# Patient Record
Sex: Female | Born: 1950 | Race: White | Hispanic: No | Marital: Married | Smoking: Never smoker
Health system: Southern US, Community
[De-identification: ages and names within clinical notes are randomized; demographics above are authoritative.]

## PROBLEM LIST (undated history)

## (undated) DIAGNOSIS — T7840XA Allergy, unspecified, initial encounter: Secondary | ICD-10-CM

## (undated) DIAGNOSIS — K219 Gastro-esophageal reflux disease without esophagitis: Secondary | ICD-10-CM

## (undated) DIAGNOSIS — E785 Hyperlipidemia, unspecified: Secondary | ICD-10-CM

## (undated) DIAGNOSIS — N200 Calculus of kidney: Secondary | ICD-10-CM

## (undated) DIAGNOSIS — H269 Unspecified cataract: Secondary | ICD-10-CM

## (undated) DIAGNOSIS — F419 Anxiety disorder, unspecified: Secondary | ICD-10-CM

## (undated) DIAGNOSIS — N390 Urinary tract infection, site not specified: Secondary | ICD-10-CM

## (undated) HISTORY — PX: UPPER GASTROINTESTINAL ENDOSCOPY: SHX188

## (undated) HISTORY — DX: Unspecified cataract: H26.9

## (undated) HISTORY — PX: COLONOSCOPY: SHX174

## (undated) HISTORY — DX: Allergy, unspecified, initial encounter: T78.40XA

## (undated) HISTORY — DX: Anxiety disorder, unspecified: F41.9

## (undated) HISTORY — DX: Urinary tract infection, site not specified: N39.0

## (undated) HISTORY — DX: Hyperlipidemia, unspecified: E78.5

## (undated) HISTORY — DX: Calculus of kidney: N20.0

## (undated) HISTORY — PX: TONSILLECTOMY: SUR1361

## (undated) HISTORY — PX: APPENDECTOMY: SHX54

## (undated) HISTORY — DX: Gastro-esophageal reflux disease without esophagitis: K21.9

---

## 1974-11-16 HISTORY — PX: ABDOMINAL HYSTERECTOMY: SHX81

## 1988-11-16 HISTORY — PX: LAPAROSCOPIC CHOLECYSTECTOMY: SUR755

## 1998-06-07 ENCOUNTER — Encounter: Admission: RE | Admit: 1998-06-07 | Discharge: 1998-09-05 | Payer: Self-pay | Admitting: Neurosurgery

## 1998-07-05 ENCOUNTER — Encounter: Payer: Self-pay | Admitting: Neurosurgery

## 1998-07-05 ENCOUNTER — Ambulatory Visit (HOSPITAL_COMMUNITY): Admission: RE | Admit: 1998-07-05 | Discharge: 1998-07-05 | Payer: Self-pay | Admitting: Neurosurgery

## 1999-02-12 ENCOUNTER — Emergency Department (HOSPITAL_COMMUNITY): Admission: EM | Admit: 1999-02-12 | Discharge: 1999-02-12 | Payer: Self-pay | Admitting: Emergency Medicine

## 1999-02-24 ENCOUNTER — Encounter: Admission: RE | Admit: 1999-02-24 | Discharge: 1999-04-24 | Payer: Self-pay | Admitting: Family Medicine

## 1999-03-28 ENCOUNTER — Encounter: Payer: Self-pay | Admitting: Family Medicine

## 1999-03-28 ENCOUNTER — Ambulatory Visit (HOSPITAL_COMMUNITY): Admission: RE | Admit: 1999-03-28 | Discharge: 1999-03-28 | Payer: Self-pay | Admitting: Family Medicine

## 1999-04-02 ENCOUNTER — Encounter: Payer: Self-pay | Admitting: Family Medicine

## 1999-04-02 ENCOUNTER — Ambulatory Visit (HOSPITAL_COMMUNITY): Admission: RE | Admit: 1999-04-02 | Discharge: 1999-04-02 | Payer: Self-pay | Admitting: Family Medicine

## 2000-02-26 ENCOUNTER — Encounter: Admission: RE | Admit: 2000-02-26 | Discharge: 2000-03-01 | Payer: Self-pay | Admitting: Family Medicine

## 2000-05-14 ENCOUNTER — Other Ambulatory Visit: Admission: RE | Admit: 2000-05-14 | Discharge: 2000-05-14 | Payer: Self-pay | Admitting: Family Medicine

## 2000-05-14 ENCOUNTER — Encounter: Payer: Self-pay | Admitting: Family Medicine

## 2000-05-14 ENCOUNTER — Ambulatory Visit (HOSPITAL_COMMUNITY): Admission: RE | Admit: 2000-05-14 | Discharge: 2000-05-14 | Payer: Self-pay | Admitting: Family Medicine

## 2000-08-02 ENCOUNTER — Encounter: Payer: Self-pay | Admitting: Family Medicine

## 2000-08-02 ENCOUNTER — Ambulatory Visit (HOSPITAL_COMMUNITY): Admission: RE | Admit: 2000-08-02 | Discharge: 2000-08-02 | Payer: Self-pay | Admitting: Family Medicine

## 2000-09-28 ENCOUNTER — Observation Stay (HOSPITAL_COMMUNITY): Admission: RE | Admit: 2000-09-28 | Discharge: 2000-09-29 | Payer: Self-pay | Admitting: Obstetrics and Gynecology

## 2000-09-28 ENCOUNTER — Encounter (INDEPENDENT_AMBULATORY_CARE_PROVIDER_SITE_OTHER): Payer: Self-pay | Admitting: Specialist

## 2001-05-16 ENCOUNTER — Ambulatory Visit (HOSPITAL_COMMUNITY): Admission: RE | Admit: 2001-05-16 | Discharge: 2001-05-16 | Payer: Self-pay | Admitting: Family Medicine

## 2001-05-16 ENCOUNTER — Encounter: Payer: Self-pay | Admitting: Family Medicine

## 2002-05-24 ENCOUNTER — Ambulatory Visit (HOSPITAL_COMMUNITY): Admission: RE | Admit: 2002-05-24 | Discharge: 2002-05-24 | Payer: Self-pay | Admitting: Family Medicine

## 2002-05-24 ENCOUNTER — Encounter: Payer: Self-pay | Admitting: Family Medicine

## 2003-05-28 ENCOUNTER — Ambulatory Visit (HOSPITAL_COMMUNITY): Admission: RE | Admit: 2003-05-28 | Discharge: 2003-05-28 | Payer: Self-pay | Admitting: *Deleted

## 2003-05-28 ENCOUNTER — Encounter: Payer: Self-pay | Admitting: Obstetrics and Gynecology

## 2003-05-30 ENCOUNTER — Encounter: Admission: RE | Admit: 2003-05-30 | Discharge: 2003-05-30 | Payer: Self-pay | Admitting: Obstetrics and Gynecology

## 2003-05-30 ENCOUNTER — Encounter: Payer: Self-pay | Admitting: Obstetrics and Gynecology

## 2004-04-25 ENCOUNTER — Ambulatory Visit (HOSPITAL_COMMUNITY): Admission: RE | Admit: 2004-04-25 | Discharge: 2004-04-25 | Payer: Self-pay | Admitting: Internal Medicine

## 2004-04-28 ENCOUNTER — Ambulatory Visit (HOSPITAL_COMMUNITY): Admission: RE | Admit: 2004-04-28 | Discharge: 2004-04-28 | Payer: Self-pay | Admitting: Internal Medicine

## 2004-05-08 ENCOUNTER — Ambulatory Visit (HOSPITAL_COMMUNITY): Admission: RE | Admit: 2004-05-08 | Discharge: 2004-05-08 | Payer: Self-pay | Admitting: Pulmonary Disease

## 2004-11-11 ENCOUNTER — Ambulatory Visit: Payer: Self-pay | Admitting: Pulmonary Disease

## 2005-05-20 ENCOUNTER — Ambulatory Visit: Payer: Self-pay | Admitting: Pulmonary Disease

## 2006-02-03 ENCOUNTER — Ambulatory Visit (HOSPITAL_COMMUNITY): Admission: RE | Admit: 2006-02-03 | Discharge: 2006-02-03 | Payer: Self-pay | Admitting: Obstetrics & Gynecology

## 2006-02-17 ENCOUNTER — Encounter: Admission: RE | Admit: 2006-02-17 | Discharge: 2006-02-17 | Payer: Self-pay | Admitting: Obstetrics & Gynecology

## 2006-02-18 ENCOUNTER — Ambulatory Visit: Payer: Self-pay | Admitting: Pulmonary Disease

## 2006-09-30 ENCOUNTER — Ambulatory Visit: Payer: Self-pay | Admitting: Internal Medicine

## 2006-09-30 ENCOUNTER — Encounter: Payer: Self-pay | Admitting: Internal Medicine

## 2007-09-12 ENCOUNTER — Ambulatory Visit: Payer: Self-pay | Admitting: Internal Medicine

## 2007-10-04 ENCOUNTER — Ambulatory Visit: Payer: Self-pay | Admitting: Internal Medicine

## 2007-12-27 ENCOUNTER — Encounter: Payer: Self-pay | Admitting: Internal Medicine

## 2007-12-30 ENCOUNTER — Telehealth: Payer: Self-pay | Admitting: Family Medicine

## 2007-12-30 ENCOUNTER — Encounter: Admission: RE | Admit: 2007-12-30 | Discharge: 2007-12-30 | Payer: Self-pay | Admitting: Obstetrics & Gynecology

## 2008-05-29 ENCOUNTER — Ambulatory Visit: Payer: Self-pay | Admitting: Internal Medicine

## 2008-05-29 DIAGNOSIS — K5730 Diverticulosis of large intestine without perforation or abscess without bleeding: Secondary | ICD-10-CM | POA: Insufficient documentation

## 2008-05-29 DIAGNOSIS — K219 Gastro-esophageal reflux disease without esophagitis: Secondary | ICD-10-CM | POA: Insufficient documentation

## 2008-05-29 DIAGNOSIS — K589 Irritable bowel syndrome without diarrhea: Secondary | ICD-10-CM

## 2008-05-29 DIAGNOSIS — G4700 Insomnia, unspecified: Secondary | ICD-10-CM | POA: Insufficient documentation

## 2008-05-29 DIAGNOSIS — F411 Generalized anxiety disorder: Secondary | ICD-10-CM | POA: Insufficient documentation

## 2008-05-29 DIAGNOSIS — E785 Hyperlipidemia, unspecified: Secondary | ICD-10-CM

## 2008-05-29 DIAGNOSIS — F329 Major depressive disorder, single episode, unspecified: Secondary | ICD-10-CM | POA: Insufficient documentation

## 2008-05-29 DIAGNOSIS — J309 Allergic rhinitis, unspecified: Secondary | ICD-10-CM | POA: Insufficient documentation

## 2008-05-29 HISTORY — DX: Diverticulosis of large intestine without perforation or abscess without bleeding: K57.30

## 2008-05-30 ENCOUNTER — Encounter: Payer: Self-pay | Admitting: Internal Medicine

## 2008-05-30 DIAGNOSIS — R319 Hematuria, unspecified: Secondary | ICD-10-CM | POA: Insufficient documentation

## 2008-05-30 LAB — CONVERTED CEMR LAB: Vit D, 1,25-Dihydroxy: 51 (ref 30–89)

## 2008-06-01 LAB — CONVERTED CEMR LAB
Albumin: 4.4 g/dL (ref 3.5–5.2)
Alkaline Phosphatase: 66 units/L (ref 39–117)
Basophils Absolute: 0 10*3/uL (ref 0.0–0.1)
Bilirubin, Direct: 0.1 mg/dL (ref 0.0–0.3)
Calcium: 10.3 mg/dL (ref 8.4–10.5)
Chloride: 101 meq/L (ref 96–112)
Cholesterol: 225 mg/dL (ref 0–200)
Creatinine, Ser: 0.8 mg/dL (ref 0.4–1.2)
Crystals: NEGATIVE
Direct LDL: 152.3 mg/dL
Eosinophils Absolute: 0.1 10*3/uL (ref 0.0–0.7)
HCT: 40.9 % (ref 36.0–46.0)
Hemoglobin: 14 g/dL (ref 12.0–15.0)
Lymphocytes Relative: 33.9 % (ref 12.0–46.0)
Neutro Abs: 4 10*3/uL (ref 1.4–7.7)
Neutrophils Relative %: 56.7 % (ref 43.0–77.0)
Potassium: 4.1 meq/L (ref 3.5–5.1)
RDW: 12.8 % (ref 11.5–14.6)
Sodium: 141 meq/L (ref 135–145)
Squamous Epithelial / LPF: NEGATIVE /lpf
Total Protein: 7.1 g/dL (ref 6.0–8.3)
Triglycerides: 94 mg/dL (ref 0–149)
Urine Glucose: NEGATIVE mg/dL
Urobilinogen, UA: 0.2 (ref 0.0–1.0)
WBC: 6.9 10*3/uL (ref 4.5–10.5)

## 2008-06-05 ENCOUNTER — Telehealth (INDEPENDENT_AMBULATORY_CARE_PROVIDER_SITE_OTHER): Payer: Self-pay | Admitting: *Deleted

## 2008-06-11 ENCOUNTER — Ambulatory Visit: Payer: Self-pay | Admitting: Cardiovascular Disease

## 2008-06-25 ENCOUNTER — Telehealth (INDEPENDENT_AMBULATORY_CARE_PROVIDER_SITE_OTHER): Payer: Self-pay | Admitting: *Deleted

## 2008-06-26 ENCOUNTER — Telehealth (INDEPENDENT_AMBULATORY_CARE_PROVIDER_SITE_OTHER): Payer: Self-pay | Admitting: *Deleted

## 2009-02-20 ENCOUNTER — Encounter: Payer: Self-pay | Admitting: Internal Medicine

## 2009-02-22 ENCOUNTER — Encounter: Payer: Self-pay | Admitting: Internal Medicine

## 2009-03-14 ENCOUNTER — Encounter: Payer: Self-pay | Admitting: Internal Medicine

## 2009-04-01 ENCOUNTER — Ambulatory Visit: Payer: Self-pay | Admitting: Internal Medicine

## 2009-04-01 DIAGNOSIS — R209 Unspecified disturbances of skin sensation: Secondary | ICD-10-CM

## 2009-04-01 HISTORY — DX: Unspecified disturbances of skin sensation: R20.9

## 2009-04-03 ENCOUNTER — Telehealth (INDEPENDENT_AMBULATORY_CARE_PROVIDER_SITE_OTHER): Payer: Self-pay | Admitting: *Deleted

## 2009-04-09 ENCOUNTER — Encounter: Admission: RE | Admit: 2009-04-09 | Discharge: 2009-04-09 | Payer: Self-pay | Admitting: Internal Medicine

## 2009-04-11 ENCOUNTER — Telehealth: Payer: Self-pay | Admitting: Internal Medicine

## 2010-12-07 ENCOUNTER — Encounter: Payer: Self-pay | Admitting: Obstetrics & Gynecology

## 2010-12-08 ENCOUNTER — Encounter: Payer: Self-pay | Admitting: Internal Medicine

## 2011-04-03 NOTE — Assessment & Plan Note (Signed)
[facility]                           GASTROENTEROLOGY OFFICE NOTE   JASHIRA, COTUGNO                      MRN:          [id number]  DATE:09/29/2006                            DOB:          01/10/1951    Ms. Lichtenberg is a 60 year old white female with history of irritable bowel  syndrome, elevated salivary amylase, positive family history of colon cancer  in her brother.  We have followed her for gastroesophageal reflux as well as  for abdominal pain.  Last colonoscopy November 2003, upper endoscopy June  2005 for evaluation of dysphagia.  No stricture was seen at that time, but  she had bile gastritis.  She has been intermittently on Librax and PPIs.  Several months ago, she woke up with some hematemesis.  This episode  occurred again subsequently.  Because of difficulty in swallowing and  fullness in her throat, she saw an ear, nose, throat specialist in [place] where she lives.  She was examined and put on Nexium after diagnosis  of LPR.  She has stayed now on Nexium 40 mg a day for the past 2 months with  marked improvement of her symptoms but not a complete resolution.  She has  about 3 bowel movements a day.  She eats a lot of fiber but has been having  a lot of stress because of some marital problems.  She wants to move back to  [place], but her husband wants to stay in [place].   MEDICATIONS:  1. Prozac 10 mg q.d.  2. Estradiol.   PHYSICAL EXAMINATION:  VITAL SIGNS:  Blood pressure 172/82, pulse 64, and  weight 131 pounds.  She was alert, oriented, in no distress.  LUNGS:  Clear to auscultation.  COR:  Normal S1, normal S1.  ABDOMEN:  Soft with minimal tenderness in epigastric area and above the  umbilicus in the midline.  Low abdomen was normal.  Bowel sounds are  normoactive.  There was no tympany and no distention.  RECTAL:  Showed heme-occult negative stool.   IMPRESSION:  1. A 60 year old white female with  new onset hematemesis and LPR diagnosed      by ENT specialist, who had gastroesophageal reflux disease, Barrett's      esophagus.  2. History of irritable bowel syndrome.  3. Positive family history of colon cancer in her brother, next      colonoscopy due in November 2008.   PLAN:  1. Upper endoscopy will be scheduled as soon as possible to assess the      hematemesis.  2. Continue Nexium 40 mg daily.  Add Pepcid 40 mg at bedtime.  3. Probiotics, samples of Align to take one daily.  As an alternative, she      can take Levsin      sublingually 0.125 mg for bloating and flatulence.  Consider CT scan of      the chest for abnormality on chest CT several years ago, evaluated by      Dr. Kriste Basque.     Hedwig Morton. Juanda Chance, MD  Electronically Signed  DMB/MedQ  DD: 09/29/2006  DT: 09/29/2006  Job #: [id number]   cc:   Lonzo Cloud. Kriste Basque, MD

## 2011-04-03 NOTE — Op Note (Signed)
[facility]  Patient:    Kayla Gallegos, Kayla Gallegos                      MRN: [id number] Proc. Date: 09/28/00 Adm. Date:  [id number] Attending:  Jenean Lindau CC:         Roxy Manns, M.D. [facility]   Operative Report  PREOPERATIVE DIAGNOSIS:       Left adnexal complex mass.  POSTOPERATIVE DIAGNOSES:      Left adnexal complex mass, pelvic adhesions.  PROCEDURE:                    Laparoscopic left salpingo-oophorectomy with lysis of adhesions.  SURGEON:                      Laqueta Linden, M.D.  ASSISTANT:                    Edwena Felty. Ashley Royalty, M.D.  ANESTHESIA:                   General endotracheal.  ESTIMATED BLOOD LOSS:         Less than 50 cc.  SPECIMEN:                     Left tube and ovary.  COUNTS:                       Correct.  COMPLICATIONS:                None.  INDICATIONS:                  Kayla Gallegos is a 60 year old para 2 female who underwent a TAH, RSO in the distant past for pelvic inflammatory disease and menorrhagia.  [place]  She returned recently and was undergoing some evaluation for back pain and had a pelvic examination by her primary physician which revealed some tenderness on pelvic examination.  She had a pelvic ultrasound which revealed a complex cyst involving the left ovary.  She had a follow-up ultrasound in six weeks which revealed an abnormal left ovary with a 1.8 cm complex cyst with a thick septation and echoes and an additional 2.5 cm complex cyst with low level echoes.  She reported pain with certain types of movement, but no acute symptoms.  Compounding history is that the patient lives out of the country and plans to return to the [place].  She had a CA 125 which was within normal limits at 14.  Due to her concern over malignancy and pain as well as her living situation, she elected to proceed to removal of the persistent adnexal mass.  She was counseled as to  risks, benefits, alternatives, and complications including the very low risk of malignancy and the significant risk of encountering adhesions and possibly needing to do a laparotomy as well as increased risk of injury to bowel, bladder, ureters, vessels, and nerves, again related to her past history of adhesions.  She has voiced her understanding, accepts all risks, and agrees to proceed.  PROCEDURE:                    Patient was taken to the operating room and after proper identification and consents were ascertained she was placed on the operating table in supine position.  After the induction  of general endotracheal anesthesia she was placed in the stirrups and the abdomen, perineum, and vagina were prepped and draped in a routine sterile fashion.  A transurethral Foley was placed which was removed at the conclusion of the procedure.  A sponge stick was placed in the vagina.  A 2 cm infraumbilical incision was then made and the Veress needle inserted into what was felt to be the peritoneal cavity with appropriate findings at hanging drop and saline installation test.  3 L of CO2 were infused.  The Veress needle was then removed.  The Trocar was then inserted.  Upon insertion of the laparoscope it appeared that the Trocar was in the peritoneal cavity with no obvious injury or bleeding at the insertion site or surrounding areas.  However, it appeared that the majority of the insufflation had gone into the preperitoneal space with some bulging and edema of the space.  There was no active bleeding or any hematoma formation noted and this was observed throughout the procedure.  The pneumoperitoneum was then established without difficulty.  A 5 mm port was placed in the right lower quadrant and a 10 mm port in the left lower quadrant under direct vision.  Inspection of the upper abdomen revealed a smooth liver edge.  The appendix was not visualized and was surgically absent by  history. Patient was placed in steep Trendelenburg.  The vaginal cuff was identified by the sponge stick.  The left ovary was noted to be encased in adhesions including small bowel, appendices epiploica, and multiple perineal adhesions involving both the ovary and the tube.  The ovary itself appeared to have a nodular component on the posterior aspect but this appeared to be more consistent with a resolving hemorrhagic cyst.  There was no evidence of any gross malignancy.  It was felt that the complex nature of what was seen on the ultrasound was probably related to the multiple adhesions and windows in peritoneum and appendices epiploica that created the appearance of a cystic mass.  In any event, it was felt that this could be removed laparoscopically if the ovary could be appropriately mobilized.  An extensive amount of time was spent doing careful sharp dissection using the [place] for hydro dissection as well as the monopolar cautery scissors.  Care was taken to stay as far from the bowel attachment as possible.  It was felt that this was accomplished without any question of injury to bowel.  The ovary was mobilized from below by blunt dissection in order to pull it out of the course of the ureter.  This dissection process took approximately 50% greater time than typical for a laparoscopic procedure of this sort.  Eventually, the adnexa was mobilized and at this point 0 Vicryl endo loops were utilized to ligate the infundibulopelvic ligament.  The tube had to be removed separately as the pedicle was too large and it was not felt the entire ovary would be removed in this fashion.  The endo loops were then used on the mesosalpinx and the tube was then excised and removed separately.  The ovary was further mobilized and two 0 Vicryl endo loops were then placed across the infundibulopelvic ligament.  The specimen was then excised and removed through the large Trocar. Hemostasis was noted  to be excellent.  There appeared to be no residual ovarian tissue on the pedicle.  Several small bleeding points were cauterized.  Care was taken to stay away from the ureter as well as the bowel.  Copious lavage was accomplished and observation beneath the fluid with and without release of the pressure was accomplished with no bleeding noted.  At this point the subcutaneous air had almost completely resolved.  The remainder of the pneumoperitoneum was allowed to escape and the accessory ports were removed under direct vision.  The 10 mm port had closure of the fascia under direct vision with a figure-of-eight suture of 0 Vicryl.  Both accessory incisions were closed with the skin glue.  The umbilical incision was closed with a subcuticular stitch of 4-0 Dexon.  Steri-Strips and pressure dressing were applied to this incision.  The incisions were injected with total of 10 cc of 1/4% plain Marcaine for local analgesia.  The sponge stick and Foley catheter were removed.  The patient was stable on transfer to the recovery room.  She will be observed and discharged per anesthesia.  She will be given routine instructions including instructions to call for nausea, vomiting, excessive pain, fever, or other concerns.  She will be given prescriptions for Percocet, dispensed 20, one to two q.4-6h. p.r.n. pain.  Told to take Advil or Aleve as needed.  Given a prescription for estradiol 1 mg one p.o. q.d., dispensed 31 with 11 refills.  She is to follow up in the office in two weeks or sooner for excessive pain, fever, bleeding, or other concerns. DD:  09/28/00 TD:  09/28/00 Job: [id number]

## 2012-08-22 ENCOUNTER — Encounter: Payer: Self-pay | Admitting: Internal Medicine

## 2012-09-30 ENCOUNTER — Encounter: Payer: Self-pay | Admitting: Internal Medicine

## 2012-11-17 ENCOUNTER — Ambulatory Visit (AMBULATORY_SURGERY_CENTER): Payer: PRIVATE HEALTH INSURANCE | Admitting: *Deleted

## 2012-11-17 VITALS — Ht 62.5 in | Wt 138.0 lb

## 2012-11-17 DIAGNOSIS — Z1211 Encounter for screening for malignant neoplasm of colon: Secondary | ICD-10-CM

## 2012-11-17 DIAGNOSIS — Z800 Family history of malignant neoplasm of digestive organs: Secondary | ICD-10-CM

## 2012-11-17 MED ORDER — MOVIPREP 100 G PO SOLR: ORAL | Status: DC

## 2012-11-29 ENCOUNTER — Encounter: Payer: Self-pay | Admitting: Internal Medicine

## 2012-11-29 ENCOUNTER — Ambulatory Visit (AMBULATORY_SURGERY_CENTER): Payer: PRIVATE HEALTH INSURANCE | Admitting: Internal Medicine

## 2012-11-29 VITALS — BP 146/94 | HR 78 | Temp 97.0°F | Resp 14 | Ht 62.5 in | Wt 138.0 lb

## 2012-11-29 DIAGNOSIS — D126 Benign neoplasm of colon, unspecified: Secondary | ICD-10-CM

## 2012-11-29 DIAGNOSIS — Z800 Family history of malignant neoplasm of digestive organs: Secondary | ICD-10-CM

## 2012-11-29 DIAGNOSIS — Z1211 Encounter for screening for malignant neoplasm of colon: Secondary | ICD-10-CM

## 2012-11-29 MED ORDER — SODIUM CHLORIDE 0.9 % IV SOLN: 500.0000 mL | INTRAVENOUS | Status: DC

## 2012-11-29 NOTE — Progress Notes (Signed)
Patient did not experience any of the following events: a burn prior to discharge; a fall within the facility; wrong site/side/patient/procedure/implant event; or a hospital transfer or hospital admission upon discharge from the facility. (G8907) Patient did not have preoperative order for IV antibiotic SSI prophylaxis. (G8918)  

## 2012-11-29 NOTE — Progress Notes (Addendum)
No egg or soy allergy. ewm 

## 2012-11-29 NOTE — Op Note (Signed)
[facility] [address] [place], [address]   COLONOSCOPY PROCEDURE REPORT  PATIENT: Kayla Gallegos, Kayla J.  MR#: [id number] BIRTHDATE: Jun 10, 1951 , 61  yrs. old GENDER: Female ENDOSCOPIST: Hart Carwin, MD REFERRED BY:  recall colonoscopy PROCEDURE DATE:  11/29/2012 PROCEDURE:   Colonoscopy with cold biopsy polypectomy ASA CLASS:   Class II INDICATIONS:Patient's immediate family history of colon cancer and prior colonoscopies 1994,1998,2003,2008, having RLQ abd.  pain. MEDICATIONS: MAC sedation, administered by CRNA and Propofol (Diprivan) 220 mg IV  DESCRIPTION OF PROCEDURE:   After the risks and benefits and of the procedure were explained, informed consent was obtained.  A digital rectal exam revealed no abnormalities of the rectum.    The LB [id number]-[id number]  endoscope was introduced through the anus and advanced to the cecum, which was identified by both the appendix and ileocecal valve .  The quality of the prep was excellent, using MoviPrep .  The instrument was then slowly withdrawn as the colon was fully examined.     COLON FINDINGS: A smooth sessile polyp ranging between 3-27mm in size was found at the cecum.  A polypectomy was performed with cold forceps.  The resection was complete and the polyp tissue was completely retrieved.   There was severe diverticulosis noted in the sigmoid colon with associated tortuosity, muscular hypertrophy and colonic narrowing.     Retroflexed views revealed no abnormalities.     The scope was then withdrawn from the patient and the procedure completed.  COMPLICATIONS: There were no complications. ENDOSCOPIC IMPRESSION: 1.   Sessile polyp ranging between 3-92mm in size was found at the cecum; polypectomy was performed with cold forceps 2.   There was severe diverticulosis noted in the sigmoid colon  RECOMMENDATIONS: High fiber diet Metamucil 1 tsp daily await path report   REPEAT EXAM: In 5 year(s)  for  Colonoscopy.  cc:    Dr Oliver Barre  _______________________________ eSigned:  Hart Carwin, MD 11/29/2012 9:29 AM     PATIENT NAME:  Kayla Gallegos, Kayla Grad. MR#: [id number]

## 2012-11-29 NOTE — Patient Instructions (Addendum)

## 2012-11-29 NOTE — Progress Notes (Signed)
Pt stable to RR 

## 2012-11-30 ENCOUNTER — Telehealth: Payer: Self-pay

## 2012-11-30 NOTE — Telephone Encounter (Signed)
  Follow up Call-  Call back number 11/29/2012  Post procedure Call Back phone  # [phone]  Permission to leave phone message Yes     Patient questions:  Do you have a fever, pain , or abdominal swelling? yes Pain Score  3 *  Have you tolerated food without any problems? yes  Have you been able to return to your normal activities? yes  Do you have any questions about your discharge instructions: Diet   no Medications  no Follow up visit  no  Do you have questions or concerns about your Care? no  Actions: * If pain score is 4 or above: No action needed, pain <4.

## 2012-12-02 ENCOUNTER — Telehealth: Payer: Self-pay | Admitting: Internal Medicine

## 2012-12-02 DIAGNOSIS — R109 Unspecified abdominal pain: Secondary | ICD-10-CM

## 2012-12-02 MED ORDER — DICYCLOMINE HCL 20 MG PO TABS: ORAL_TABLET | ORAL | Status: DC

## 2012-12-02 NOTE — Telephone Encounter (Signed)
Bentyl 20 mg po bid, # 30,, if no improvement call back in 2 weeks and we will decide if we need to order CT scan

## 2012-12-02 NOTE — Telephone Encounter (Signed)
Spoke with patient and gave her Dr. Regino Schultze recommendations. Rx sent. She will call me in 2 weeks with update.

## 2012-12-02 NOTE — Telephone Encounter (Signed)
Patient calling to report right side pain that started on Wednesday after her procedure. The pain is below the belly button "in my ovary area." It is there all the time but gets worse during the evening. States it is sore to touch. She took Ibuprofen last night for it. She reports feeling cold last night but does not know if she had a fever. Denies nausea or vomiting. She is having normal bowel movements. She reports she had right side pain off and on before the colonoscopy but never this severe. Colonoscopy on 11/29/12 with polypectomy, severe diverticulosis in sigmoid. Please, advise.

## 2012-12-05 ENCOUNTER — Encounter: Payer: Self-pay | Admitting: Internal Medicine

## 2014-11-13 ENCOUNTER — Telehealth: Payer: Self-pay | Admitting: Internal Medicine

## 2014-11-13 NOTE — Telephone Encounter (Signed)
Pt was a pt of Dr Glori Bickers and was last seen by [place] in 2003. She moved to [place] and has just moved back here and would like to re-est care with Dr Glori Bickers. I know she is not taking new patients but is it ok to re-est pts with Dr Glori Bickers?

## 2014-11-13 NOTE — Telephone Encounter (Signed)
That is ok -please put her in for 30 min visit to est

## 2014-11-14 NOTE — Telephone Encounter (Signed)
Called pt to notify that she can re-est with Dr Glori Bickers but pt will need to call back to schedule this. She has some trips planned and did not have her calendar available. Will call back after the first of the year to schedule.

## 2015-07-22 DIAGNOSIS — R030 Elevated blood-pressure reading, without diagnosis of hypertension: Secondary | ICD-10-CM | POA: Diagnosis not present

## 2015-07-22 DIAGNOSIS — S61241A Puncture wound with foreign body of left index finger without damage to nail, initial encounter: Secondary | ICD-10-CM | POA: Diagnosis not present

## 2016-08-17 DIAGNOSIS — N200 Calculus of kidney: Secondary | ICD-10-CM | POA: Diagnosis not present

## 2016-08-17 DIAGNOSIS — R3915 Urgency of urination: Secondary | ICD-10-CM | POA: Diagnosis not present

## 2016-08-17 DIAGNOSIS — R3121 Asymptomatic microscopic hematuria: Secondary | ICD-10-CM | POA: Diagnosis not present

## 2016-08-17 DIAGNOSIS — N3020 Other chronic cystitis without hematuria: Secondary | ICD-10-CM | POA: Diagnosis not present

## 2016-08-24 DIAGNOSIS — R3121 Asymptomatic microscopic hematuria: Secondary | ICD-10-CM | POA: Diagnosis not present

## 2016-08-24 DIAGNOSIS — R3129 Other microscopic hematuria: Secondary | ICD-10-CM | POA: Diagnosis not present

## 2016-09-21 ENCOUNTER — Telehealth: Payer: Self-pay | Admitting: Internal Medicine

## 2016-09-21 NOTE — Telephone Encounter (Signed)
Patient called this morning and due to living in [place], would like to transfer back to [facility] from [place]. She has been living in the [place] for some time and has no significant health concerns, just wants to begin getting regular CPE's. Please let me know if this transfer would be OK with both providers. Best number to call pt is [phone].

## 2016-09-22 NOTE — Telephone Encounter (Signed)
Left message to schedule/  lt

## 2016-09-25 DIAGNOSIS — R8271 Bacteriuria: Secondary | ICD-10-CM | POA: Diagnosis not present

## 2016-09-25 DIAGNOSIS — R350 Frequency of micturition: Secondary | ICD-10-CM | POA: Diagnosis not present

## 2016-09-30 ENCOUNTER — Encounter: Payer: Self-pay | Admitting: Primary Care

## 2016-09-30 ENCOUNTER — Ambulatory Visit (INDEPENDENT_AMBULATORY_CARE_PROVIDER_SITE_OTHER): Payer: Medicare Other | Admitting: Primary Care

## 2016-09-30 VITALS — BP 136/80 | HR 65 | Temp 98.1°F | Ht 62.0 in | Wt 133.8 lb

## 2016-09-30 DIAGNOSIS — E785 Hyperlipidemia, unspecified: Secondary | ICD-10-CM

## 2016-09-30 DIAGNOSIS — R0989 Other specified symptoms and signs involving the circulatory and respiratory systems: Secondary | ICD-10-CM | POA: Diagnosis not present

## 2016-09-30 DIAGNOSIS — E2839 Other primary ovarian failure: Secondary | ICD-10-CM

## 2016-09-30 DIAGNOSIS — Z0001 Encounter for general adult medical examination with abnormal findings: Secondary | ICD-10-CM

## 2016-09-30 DIAGNOSIS — Z1159 Encounter for screening for other viral diseases: Secondary | ICD-10-CM

## 2016-09-30 DIAGNOSIS — N390 Urinary tract infection, site not specified: Secondary | ICD-10-CM

## 2016-09-30 LAB — COMPREHENSIVE METABOLIC PANEL
ALT: 24 U/L (ref 0–35)
AST: 24 U/L (ref 0–37)
Albumin: 4.7 g/dL (ref 3.5–5.2)
Alkaline Phosphatase: 70 U/L (ref 39–117)
BILIRUBIN TOTAL: 0.6 mg/dL (ref 0.2–1.2)
BUN: 11 mg/dL (ref 6–23)
CALCIUM: 10.1 mg/dL (ref 8.4–10.5)
CHLORIDE: 100 meq/L (ref 96–112)
CO2: 31 meq/L (ref 19–32)
CREATININE: 0.7 mg/dL (ref 0.40–1.20)
GFR: 88.92 mL/min (ref >60.00)
Glucose, Bld: 96 mg/dL (ref 70–99)
Potassium: 4.5 mEq/L (ref 3.5–5.1)
SODIUM: 138 meq/L (ref 135–145)
Total Protein: 7.3 g/dL (ref 6.0–8.3)

## 2016-09-30 LAB — LIPID PANEL
CHOL/HDL RATIO: 4
Cholesterol: 202 mg/dL — ABNORMAL HIGH (ref 0–200)
HDL: 54.6 mg/dL (ref >39.00)
LDL CALC: 130 mg/dL — AB (ref 0–99)
NONHDL: 147.09
TRIGLYCERIDES: 84 mg/dL (ref 0.0–149.0)
VLDL: 16.8 mg/dL (ref 0.0–40.0)

## 2016-09-30 NOTE — Assessment & Plan Note (Signed)
No current treatment, lipids pending. Carotid bruit noted upon exam. Given history of hyperlipidemia and undiagnosed bruit, will send for carotid dopplers.

## 2016-09-30 NOTE — Assessment & Plan Note (Signed)
Td UTD, declines influenza, pneumonia, and zostavax vaccinations. Declines mammogram. Colonoscopy UTD. Commended her on her healthy diet. Encouraged regular exercise and stress reduction.  Exam with right carotid bruit, otherwise unremarkable. Labs pending. Follow up in 1 year for annual physical.

## 2016-09-30 NOTE — Assessment & Plan Note (Signed)
Follows with Urology. 4 UTI's in 2017 thus far.

## 2016-09-30 NOTE — Progress Notes (Signed)
Pre visit review using our clinic review tool, if applicable. No additional management support is needed unless otherwise documented below in the visit note. 

## 2016-09-30 NOTE — Progress Notes (Signed)
Subjective:    Patient ID: Kayla Gallegos, female    DOB: 03-30-1951, 65 y.o.   MRN: [id number]  HPI  Kayla Gallegos is a 65 year old female who presents today to re-establish care, discuss the problems mentioned below, and for complete physical. Will obtain records. Her last physical was years ago.   1) Recurrent UTI's: Over the last several years she's experienced frequent UTI's. She's had 4 UTI's in 2017. She currently follows with Urology, last visit being last Friday. She was diagnosed with a UTI, has been taking AZO since with improvement. She's not yet started her antibiotics. When she experiences UTI's she will experience urinary frequency and urgency. She denies these symptoms at this point. She also denies fevers, flank pain, hematuria, abdominal pain.  2) Hyperlipidemia: Long history of hyperlipidemia, also strong family history. She controls her cholesterol with healthy lifestyle. She's taking Niacin OTC.    Immunizations: -Tetanus: Completed 3 years ago. -Influenza: Declines today. -Pneumonia: Never completed, declines.  -Shingles: Never completed, declines.  Diet: She endorses a healthy diet. Breakfast: Oatmeal, fruit, nuts, seed Lunch: Salad, fruit Dinner: Fish, vegetables, salad, fruit Snacks: Fruit, nuts, seeds Desserts: None Beverages: Coffee, hot tea, water, juice  Exercise: She does not currently exercise Eye exam: Completed in March 2017. Dental exam: Completes semi-annually. Colonoscopy: Completed in 2013, due in 2018. Dexa: Completed in 2007. Due.  Pap Smear: Hysterectomy  Mammogram: Completed last in 2009, declines today.   Review of Systems  Constitutional: Negative for unexpected weight change.  HENT: Negative for rhinorrhea.   Respiratory: Negative for cough and shortness of breath.   Cardiovascular: Negative for chest pain.  Gastrointestinal: Negative for constipation and diarrhea.  Genitourinary: Negative for difficulty urinating and menstrual  problem.  Musculoskeletal: Negative for arthralgias and myalgias.  Skin: Negative for rash.  Allergic/Immunologic: Negative for environmental allergies.  Neurological: Negative for dizziness, numbness and headaches.  Psychiatric/Behavioral:       Has experienced personal and work related stress.        Past Medical History:  Diagnosis Date  . Hyperlipidemia   . Kidney stones   . Recurrent UTI (urinary tract infection)      Social History   Social History  . Marital status: Married    Spouse name: N/A  . Number of children: N/A  . Years of education: N/A   Occupational History  . Not on file.   Social History Main Topics  . Smoking status: Never Smoker  . Smokeless tobacco: Never Used  . Alcohol use No  . Drug use: No  . Sexual activity: Not on file   Other Topics Concern  . Not on file   Social History Narrative  . No narrative on file    Past Surgical History:  Procedure Laterality Date  . ABDOMINAL HYSTERECTOMY  1976  . LAPAROSCOPIC CHOLECYSTECTOMY  1990  . TONSILLECTOMY      Family History  Problem Relation Age of Onset  . Colon cancer Sister 77  . Colon cancer Brother 109  . Stomach cancer Neg Hx     Allergies  Allergen Reactions  . Codeine Nausea And Vomiting and Nausea Only    dizziness    Current Outpatient Prescriptions on File Prior to Visit  Medication Sig Dispense Refill  . B Complex Vitamins (VITAMIN-B COMPLEX PO) Take by mouth daily.    Marland Kitchen FOLIC ACID PO Take by mouth daily.    Marland Kitchen MAGNESIUM CARBONATE PO Take 1 capsule by mouth daily.    Marland Kitchen  niacin 250 MG tablet Take 250 mg by mouth daily with breakfast. Takes total of 250 mg twice daily    . Probiotic Product (PROBIOTIC DAILY PO) Take by mouth daily.     No current facility-administered medications on file prior to visit.     BP 136/80   Pulse 65   Temp 98.1 F (36.7 C) (Oral)   Ht 5\' 2"  (1.575 m)   Wt 133 lb 12.8 oz (60.7 kg)   SpO2 98%   BMI 24.47 kg/m    Objective:    Physical Exam  Constitutional: She is oriented to person, place, and time. She appears well-nourished.  HENT:  Right Ear: Tympanic membrane and ear canal normal.  Left Ear: Tympanic membrane and ear canal normal.  Nose: Nose normal.  Mouth/Throat: Oropharynx is clear and moist.  Eyes: Conjunctivae and EOM are normal. Pupils are equal, round, and reactive to light.  Neck: Neck supple. Carotid bruit is present. No thyromegaly present.  Right carotid bruit  Cardiovascular: Normal rate and regular rhythm.   No murmur heard. Pulmonary/Chest: Effort normal and breath sounds normal. She has no rales.  Abdominal: Soft. Bowel sounds are normal. There is no tenderness.  Musculoskeletal: Normal range of motion.  Lymphadenopathy:    She has no cervical adenopathy.  Neurological: She is alert and oriented to person, place, and time. She has normal reflexes. No cranial nerve deficit.  Skin: Skin is warm and dry. No rash noted.  Psychiatric: She has a normal mood and affect.          Assessment & Plan:

## 2016-09-30 NOTE — Patient Instructions (Addendum)
Complete lab work prior to leaving today.  You will be contacted regarding your bone density testing.  Please let us know if you have not heard back within one week.   Stop by the front desk and speak with either Rosaria Ferries or Ebony Hail regarding your carotid ultrasound test.   Start exercising. You should be getting 150 minutes of moderate intensity exercise weekly.  Continue your efforts towards a healthy diet.  I recommend pneumonia and shingles vaccinations. I also recommend a mammogram every 2 years.   I will be in touch with you once I receive your lab results.  It was a pleasure to meet you today! Please don't hesitate to call me with any questions. Welcome to [facility]!

## 2016-10-01 LAB — HEPATITIS C ANTIBODY: HCV Ab: NEGATIVE

## 2016-10-02 ENCOUNTER — Ambulatory Visit (HOSPITAL_COMMUNITY)
Admission: RE | Admit: 2016-10-02 | Discharge: 2016-10-02 | Disposition: A | Discharge status: Home / Self Care | Payer: Medicare Other | Source: Ambulatory Visit | Attending: Cardiovascular Disease | Admitting: Cardiovascular Disease

## 2016-10-02 ENCOUNTER — Telehealth: Payer: Self-pay | Admitting: Primary Care

## 2016-10-02 DIAGNOSIS — R0989 Other specified symptoms and signs involving the circulatory and respiratory systems: Secondary | ICD-10-CM | POA: Insufficient documentation

## 2016-10-02 DIAGNOSIS — E785 Hyperlipidemia, unspecified: Secondary | ICD-10-CM | POA: Diagnosis not present

## 2016-10-02 DIAGNOSIS — I6523 Occlusion and stenosis of bilateral carotid arteries: Secondary | ICD-10-CM | POA: Insufficient documentation

## 2016-10-02 NOTE — Telephone Encounter (Signed)
Patient returned Chan's call. °

## 2016-10-05 DIAGNOSIS — N200 Calculus of kidney: Secondary | ICD-10-CM | POA: Diagnosis not present

## 2016-10-05 DIAGNOSIS — R3121 Asymptomatic microscopic hematuria: Secondary | ICD-10-CM | POA: Diagnosis not present

## 2016-10-05 NOTE — Telephone Encounter (Signed)
Spoken and notified patient of Kate's comments. Patient verbalized understanding. 

## 2016-10-20 ENCOUNTER — Other Ambulatory Visit: Payer: Medicare Other

## 2016-12-07 DIAGNOSIS — J069 Acute upper respiratory infection, unspecified: Secondary | ICD-10-CM | POA: Diagnosis not present

## 2016-12-08 ENCOUNTER — Ambulatory Visit: Payer: Medicare Other | Admitting: Internal Medicine

## 2017-02-01 ENCOUNTER — Ambulatory Visit: Payer: Medicare Other | Admitting: Family Medicine

## 2017-04-29 DIAGNOSIS — M545 Low back pain: Secondary | ICD-10-CM | POA: Diagnosis not present

## 2017-04-29 DIAGNOSIS — M5432 Sciatica, left side: Secondary | ICD-10-CM | POA: Diagnosis not present

## 2017-05-06 DIAGNOSIS — M545 Low back pain: Secondary | ICD-10-CM | POA: Diagnosis not present

## 2017-05-12 DIAGNOSIS — M48062 Spinal stenosis, lumbar region with neurogenic claudication: Secondary | ICD-10-CM | POA: Diagnosis not present

## 2017-05-12 DIAGNOSIS — M545 Low back pain: Secondary | ICD-10-CM | POA: Diagnosis not present

## 2017-05-18 DIAGNOSIS — M48062 Spinal stenosis, lumbar region with neurogenic claudication: Secondary | ICD-10-CM | POA: Diagnosis not present

## 2017-05-25 DIAGNOSIS — M48062 Spinal stenosis, lumbar region with neurogenic claudication: Secondary | ICD-10-CM | POA: Diagnosis not present

## 2017-05-27 DIAGNOSIS — M48062 Spinal stenosis, lumbar region with neurogenic claudication: Secondary | ICD-10-CM | POA: Diagnosis not present

## 2017-06-01 DIAGNOSIS — M48062 Spinal stenosis, lumbar region with neurogenic claudication: Secondary | ICD-10-CM | POA: Diagnosis not present

## 2017-06-03 DIAGNOSIS — M48062 Spinal stenosis, lumbar region with neurogenic claudication: Secondary | ICD-10-CM | POA: Diagnosis not present

## 2017-06-08 DIAGNOSIS — M48062 Spinal stenosis, lumbar region with neurogenic claudication: Secondary | ICD-10-CM | POA: Diagnosis not present

## 2017-06-09 DIAGNOSIS — M48062 Spinal stenosis, lumbar region with neurogenic claudication: Secondary | ICD-10-CM | POA: Diagnosis not present

## 2017-10-18 ENCOUNTER — Encounter: Payer: Self-pay | Admitting: Primary Care

## 2017-10-18 ENCOUNTER — Ambulatory Visit (INDEPENDENT_AMBULATORY_CARE_PROVIDER_SITE_OTHER): Payer: Medicare Other | Admitting: Primary Care

## 2017-10-18 VITALS — BP 134/82 | HR 87 | Temp 98.7°F | Wt 137.0 lb

## 2017-10-18 DIAGNOSIS — N390 Urinary tract infection, site not specified: Secondary | ICD-10-CM | POA: Diagnosis not present

## 2017-10-18 DIAGNOSIS — R3915 Urgency of urination: Secondary | ICD-10-CM | POA: Diagnosis not present

## 2017-10-18 DIAGNOSIS — Z87440 Personal history of urinary (tract) infections: Secondary | ICD-10-CM | POA: Diagnosis not present

## 2017-10-18 DIAGNOSIS — N898 Other specified noninflammatory disorders of vagina: Secondary | ICD-10-CM

## 2017-10-18 LAB — POC URINALSYSI DIPSTICK (AUTOMATED)
BILIRUBIN UA: NEGATIVE
Blood, UA: NEGATIVE
GLUCOSE UA: NEGATIVE
Ketones, UA: NEGATIVE
LEUKOCYTES UA: NEGATIVE
NITRITE UA: NEGATIVE
Protein, UA: NEGATIVE
Spec Grav, UA: 1.01 (ref 1.010–1.025)
UROBILINOGEN UA: 0.2 U/dL
pH, UA: 6 (ref 5.0–8.0)

## 2017-10-18 MED ORDER — ESTRADIOL 0.1 MG/GM VA CREA: 1.0000 | TOPICAL_CREAM | VAGINAL | 1 refills | Status: DC

## 2017-10-18 NOTE — Addendum Note (Signed)
Addended by: Modena Nunnery on: 10/18/2017 04:00 PM   Modules accepted: Orders

## 2017-10-18 NOTE — Patient Instructions (Signed)
Try the estrace cream for vaginal dryness. Insert one applicator three times weekly for vaginal dryness and UTI prevention.  Continue to push intake of water.  Please contact your Urologist if your symptoms persist.  It was a pleasure to see you today!

## 2017-10-18 NOTE — Assessment & Plan Note (Signed)
UA today unremarkable, will send culture given history. Exam today not representative of renal stones, she does have a strainer at home. She is in no distress. Follows with Urology, will have her follow up with them if symptoms persist and culture negative. Could be interstitial cystitis. Will have her continue to push water intake, continue AZO.

## 2017-10-18 NOTE — Progress Notes (Signed)
Subjective:    Patient ID: Kayla Gallegos, female    DOB: 04/03/1951, 66 y.o.   MRN: [id number]  HPI  Kayla Gallegos is a 66 year old female who presents today with a history of renal stones, UTI, vaginal dryness, hysterectomy with a chief complaint of urinary urgency. She also reports urinary frequency, vaginal dryness, and pelvic discomfort. She denies dysuria, hematuria, vaginal itching/discharge, flank pain, fevers. Her last bladder stone was in late 2017. Her symptoms have been present for the past three days. She's been taking AZO for her symptoms with some improvement.   She follows with Urology with her last visit being in November 2018. She underwent bladder scanning which was negative for stones at that time. She was once prescribed vaginal cream for atrophy as this was thought to be causing recurrent UTI's. She only took this for a short while as it caused vaginal irritation. She can't remember the name of the medication but thinks it was compounded. She endorses being under a lot of personal stress. She has recently increased her water intake.  Review of Systems  Constitutional: Negative for fever.  Genitourinary: Positive for frequency. Negative for decreased urine volume, difficulty urinating, dysuria, flank pain, hematuria, pelvic pain and vaginal discharge.       Vaginal dryness       Past Medical History:  Diagnosis Date  . Hyperlipidemia   . Kidney stones   . Recurrent UTI (urinary tract infection)      Social History   Socioeconomic History  . Marital status: Married    Spouse name: Not on file  . Number of children: Not on file  . Years of education: Not on file  . Highest education level: Not on file  Social Needs  . Financial resource strain: Not on file  . Food insecurity - worry: Not on file  . Food insecurity - inability: Not on file  . Transportation needs - medical: Not on file  . Transportation needs - non-medical: Not on file  Occupational History  .  Not on file  Tobacco Use  . Smoking status: Never Smoker  . Smokeless tobacco: Never Used  Substance and Sexual Activity  . Alcohol use: No  . Drug use: No  . Sexual activity: Not on file  Other Topics Concern  . Not on file  Social History Narrative  . Not on file    Past Surgical History:  Procedure Laterality Date  . ABDOMINAL HYSTERECTOMY  1976  . LAPAROSCOPIC CHOLECYSTECTOMY  1990  . TONSILLECTOMY      Family History  Problem Relation Age of Onset  . Colon cancer Sister 48  . Colon cancer Brother 40  . Stomach cancer Neg Hx     Allergies  Allergen Reactions  . Codeine Nausea And Vomiting and Nausea Only    dizziness    Current Outpatient Medications on File Prior to Visit  Medication Sig Dispense Refill  . ascorbic acid (VITAMIN C) 500 MG tablet Take by mouth daily.    . B Complex Vitamins (VITAMIN-B COMPLEX PO) Take by mouth daily.    Marland Kitchen FOLIC ACID PO Take by mouth daily.    Marland Kitchen MAGNESIUM CARBONATE PO Take 1 capsule by mouth daily.    . Multiple Vitamins-Minerals (ZINC PO) Take by mouth.    . niacin 250 MG tablet Take 250 mg by mouth daily with breakfast. Takes total of 250 mg twice daily    . Probiotic Product (PROBIOTIC DAILY PO) Take by mouth  daily.     No current facility-administered medications on file prior to visit.     BP 134/82   Pulse 87   Temp 98.7 F (37.1 C) (Oral)   Wt 137 lb (62.1 kg)   SpO2 99%   BMI 25.06 kg/m    Objective:   Physical Exam  Constitutional: She appears well-nourished.  Neck: Neck supple.  Cardiovascular: Normal rate.  Pulmonary/Chest: Effort normal.  Abdominal: Soft.  Skin: Skin is warm and dry.          Assessment & Plan:

## 2017-10-18 NOTE — Assessment & Plan Note (Signed)
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  Do agree that vaginal atrophy could be causing recurrent UTI, not necessarily symptoms today. She would ike to resume treatment, discussed options for treatment, she'd like to try vaginal cream. Rx for estrace cream sent to pharmacy.

## 2017-10-19 LAB — URINE CULTURE
MICRO NUMBER:: 81356169
Result:: NO GROWTH
SPECIMEN QUALITY: ADEQUATE

## 2017-12-22 ENCOUNTER — Encounter: Payer: Self-pay | Admitting: Gastroenterology

## 2018-01-11 ENCOUNTER — Other Ambulatory Visit: Payer: Self-pay | Admitting: Primary Care

## 2018-01-11 DIAGNOSIS — E785 Hyperlipidemia, unspecified: Secondary | ICD-10-CM

## 2018-01-18 ENCOUNTER — Ambulatory Visit: Payer: Medicare Other

## 2018-01-19 ENCOUNTER — Ambulatory Visit: Payer: Self-pay

## 2018-01-19 NOTE — Telephone Encounter (Signed)
Pt. Called to report she inserted a vaginal cream last night at approx. 3:00 AM, and had a bowel movement at 6:30 AM.  Reported she noticed the vaginal cream on top and mixed in the stool.  Also voiced concern of a small amt. of blood in the stool.  Described stool as being brown formed with white cream on top and white specks mixed within the stool.  Reported also saw a drop on red in the stool.  Denied seeing any blood on the toilet paper. Denied vaginal pain or burning.  Denied fever/ chills.  Denied vaginal bleeding.  Reported she thinks she has passed gas through her vaginal area a couple times within the past month.  Has had UTI in past; reported she thought she was getting a UTI again last night due to increased urinary urgency.  Stated the urgency is gone today.  Stated is voiding in good amts.  Requested an appt. For evaluation.  Appt. Given for 01/27/18 with PCP.  Encouraged to call back if symptoms worsen.  Agreed with plan.  Care advice given per protocol.             Reason for Disposition . [1] Abnormal color is unexplained AND [2] persists > 24 hours    Pt. Reported brown stool with white flecks, and one drop of red substance; voiced concern that the vaginal cream inserted at 3:00 AM had come through her stool at 6:30 AM, this morning.  Answer Assessment - Initial Assessment Questions 1. COLOR: "What color is it?" "Is that color in part or all of the stool?"     Brown stool with a drop of blood with white cream on top with white specks;  2. ONSET: "When was the unusual color first noted?"     This AM at 6:30 AM 3. CAUSE: "Have you eaten any food or taken any medicine of this color?" (See listing in BACKGROUND)     no 4. OTHER SYMPTOMS: "Do you have any other symptoms?" (e.g., diarrhea, jaundice, abdominal pain, fever).     Also has felt air/ gas come out the vagina; denies pain, denies any burning in vagina; denies fever; last night had urgency; voided in good amt.; urgency gone  today  Protocols used: STOOLS - UNUSUAL COLOR-A-AH

## 2018-01-20 NOTE — Telephone Encounter (Signed)
Noted  

## 2018-01-20 NOTE — Telephone Encounter (Signed)
I spoke with Kayla Gallegos and she rescheduled appt for 01/21/18 at 11:45 per [name]. If Kayla Gallegos condition worsens prior to appt Kayla Gallegos will go to ED. FYI to [name] NP.

## 2018-01-21 ENCOUNTER — Telehealth: Payer: Self-pay | Admitting: Primary Care

## 2018-01-21 ENCOUNTER — Encounter: Payer: Self-pay | Admitting: Primary Care

## 2018-01-21 ENCOUNTER — Ambulatory Visit (INDEPENDENT_AMBULATORY_CARE_PROVIDER_SITE_OTHER): Payer: Medicare Other | Admitting: Primary Care

## 2018-01-21 ENCOUNTER — Ambulatory Visit: Payer: Medicare Other | Admitting: Primary Care

## 2018-01-21 VITALS — BP 128/76 | HR 66 | Temp 98.0°F | Ht 62.0 in | Wt 137.0 lb

## 2018-01-21 DIAGNOSIS — K921 Melena: Secondary | ICD-10-CM | POA: Diagnosis not present

## 2018-01-21 DIAGNOSIS — Z1211 Encounter for screening for malignant neoplasm of colon: Secondary | ICD-10-CM

## 2018-01-21 DIAGNOSIS — E785 Hyperlipidemia, unspecified: Secondary | ICD-10-CM | POA: Diagnosis not present

## 2018-01-21 LAB — COMPREHENSIVE METABOLIC PANEL
ALT: 15 U/L (ref 0–35)
AST: 17 U/L (ref 0–37)
Albumin: 4.2 g/dL (ref 3.5–5.2)
Alkaline Phosphatase: 58 U/L (ref 39–117)
BILIRUBIN TOTAL: 0.6 mg/dL (ref 0.2–1.2)
BUN: 8 mg/dL (ref 6–23)
CALCIUM: 9.6 mg/dL (ref 8.4–10.5)
CO2: 30 mEq/L (ref 19–32)
CREATININE: 0.71 mg/dL (ref 0.40–1.20)
Chloride: 98 mEq/L (ref 96–112)
GFR: 87.13 mL/min (ref >60.00)
Glucose, Bld: 95 mg/dL (ref 70–99)
Potassium: 4.6 mEq/L (ref 3.5–5.1)
SODIUM: 133 meq/L — AB (ref 135–145)
Total Protein: 6.6 g/dL (ref 6.0–8.3)

## 2018-01-21 LAB — LIPID PANEL
CHOLESTEROL: 224 mg/dL — AB (ref 0–200)
HDL: 49.6 mg/dL (ref >39.00)
LDL Cholesterol: 155 mg/dL — ABNORMAL HIGH (ref 0–99)
NonHDL: 174.74
TRIGLYCERIDES: 98 mg/dL (ref 0.0–149.0)
Total CHOL/HDL Ratio: 5
VLDL: 19.6 mg/dL (ref 0.0–40.0)

## 2018-01-21 LAB — POC HEMOCCULT BLD/STL (OFFICE/1-CARD/DIAGNOSTIC): OCCULT BLOOD DATE: NEGATIVE

## 2018-01-21 NOTE — Telephone Encounter (Signed)
Copied from [place] [id number]. Topic: Quick Communication - See Telephone Encounter >> Jan 21, 2018 12:34 PM Vernona Rieger wrote: CRM for notification. See Telephone encounter for:   01/21/18.  Patient said she is due for a colonoscopy & wants a nurse to call her back so she can get that set up with. [phone]

## 2018-01-21 NOTE — Patient Instructions (Signed)
Follow up with GI for your colonoscopy as planned.  There is no evidence of rectal bleeding in your stool sample today.  It was a pleasure to see you today!

## 2018-01-21 NOTE — Progress Notes (Signed)
Subjective:    Patient ID: Kayla Gallegos, female    DOB: 04/12/1951, 67 y.o.   MRN: [id number]  HPI  Kayla Gallegos is a 67 year old female who presents today with a chief complaint of abnormal stool.   She restarted her estradiol cream, last use being Tuesday night this week. She woke up Wednesday morning had a bowel movement and noticed some white substance on top of the stool. She had another bowel movement later that day and thinks she noticed a small amount of bright red blood in the stool.   She denies rectal pain, constipation, firm stools, unexplained weight loss. She's not noticed any white substances or blood in her stools since Wednesday. Her last bowel movement was this morning. Her last colonoscopy was in 2014 with severe diverticulosis. She's due for repeat colonoscopy this year and is working on scheduling.   Review of Systems  Constitutional: Negative for fever and unexpected weight change.  Gastrointestinal: Negative for abdominal pain, blood in stool, constipation and diarrhea.  Genitourinary: Negative for vaginal discharge.       Past Medical History:  Diagnosis Date  . Hyperlipidemia   . Kidney stones   . Recurrent UTI (urinary tract infection)      Social History   Socioeconomic History  . Marital status: Married    Spouse name: Not on file  . Number of children: Not on file  . Years of education: Not on file  . Highest education level: Not on file  Social Needs  . Financial resource strain: Not on file  . Food insecurity - worry: Not on file  . Food insecurity - inability: Not on file  . Transportation needs - medical: Not on file  . Transportation needs - non-medical: Not on file  Occupational History  . Not on file  Tobacco Use  . Smoking status: Never Smoker  . Smokeless tobacco: Never Used  Substance and Sexual Activity  . Alcohol use: No  . Drug use: No  . Sexual activity: Not on file  Other Topics Concern  . Not on file  Social History  Narrative  . Not on file    Past Surgical History:  Procedure Laterality Date  . ABDOMINAL HYSTERECTOMY  1976  . LAPAROSCOPIC CHOLECYSTECTOMY  1990  . TONSILLECTOMY      Family History  Problem Relation Age of Onset  . Colon cancer Sister 21  . Colon cancer Brother 54  . Stomach cancer Neg Hx     Allergies  Allergen Reactions  . Codeine Nausea And Vomiting and Nausea Only    dizziness    Current Outpatient Medications on File Prior to Visit  Medication Sig Dispense Refill  . ascorbic acid (VITAMIN C) 500 MG tablet Take by mouth daily.    . B Complex Vitamins (VITAMIN-B COMPLEX PO) Take by mouth daily.    Marland Kitchen estradiol (ESTRACE VAGINAL) 0.1 MG/GM vaginal cream Place 1 Applicatorful vaginally 3 (three) times a week. 42.5 g 1  . FOLIC ACID PO Take by mouth daily.    Marland Kitchen MAGNESIUM CARBONATE PO Take 1 capsule by mouth daily.    . Multiple Vitamins-Minerals (ZINC PO) Take by mouth.    . niacin 250 MG tablet Take 250 mg by mouth daily with breakfast. Takes total of 250 mg twice daily    . Probiotic Product (PROBIOTIC DAILY PO) Take by mouth daily.     No current facility-administered medications on file prior to visit.     BP  128/76   Pulse 66   Temp 98 F (36.7 C) (Oral)   Ht 5\' 2"  (1.575 m)   Wt 137 lb (62.1 kg)   SpO2 99%   BMI 25.06 kg/m    Objective:   Physical Exam  Constitutional: She appears well-nourished.  Neck: Neck supple.  Cardiovascular: Normal rate and regular rhythm.  Pulmonary/Chest: Effort normal and breath sounds normal.  Genitourinary: Rectal exam shows no external hemorrhoid, no internal hemorrhoid and guaiac negative stool.  Skin: Skin is warm and dry.          Assessment & Plan:  Abnormal Stool:  Occurred Wednesday this week, no abnormal stools since. She brought a picture today which shows a small amount of the white estradiol cream on top of one portion of her stool, not mixed in. No blood evident. Rectal exam and hemoccult stool card  negative.  Suspect the estradiol cream fell out of vaginal canal onto stool. Reassurance provided. She'll schedule colonoscopy as planned.  Kayla Koch, NP

## 2018-01-21 NOTE — Telephone Encounter (Signed)
Noted, referral placed.  

## 2018-01-24 ENCOUNTER — Encounter: Payer: Self-pay | Admitting: Gastroenterology

## 2018-01-27 ENCOUNTER — Ambulatory Visit: Payer: Medicare Other | Admitting: Primary Care

## 2018-02-02 ENCOUNTER — Encounter: Payer: Self-pay | Admitting: Family Medicine

## 2018-02-02 ENCOUNTER — Ambulatory Visit (INDEPENDENT_AMBULATORY_CARE_PROVIDER_SITE_OTHER): Payer: Medicare Other | Admitting: Family Medicine

## 2018-02-02 DIAGNOSIS — N898 Other specified noninflammatory disorders of vagina: Secondary | ICD-10-CM

## 2018-02-02 MED ORDER — ESTRADIOL 0.1 MG/GM VA CREA: TOPICAL_CREAM | VAGINAL | 12 refills | Status: DC

## 2018-02-02 NOTE — Progress Notes (Signed)
Needs mammogram   Hysterctomy

## 2018-02-02 NOTE — Progress Notes (Signed)
   GYNECOLOGY ANNUAL PREVENTATIVE CARE ENCOUNTER NOTE  Subjective:   Kayla Gallegos is a 67 y.o. No obstetric history on file. female here for a routine annual gynecologic exam.  Current complaints: vaginal dryness and frequent UTI.   Denies abnormal vaginal bleeding, discharge, pelvic pain, problems with intercourse or other gynecologic concerns.    Gynecologic History No LMP recorded. Patient has had a hysterectomy. Contraception: status post hysterectomy Last Pap: wnl - last year. Never had abnormal pap Last mammogram: never had, does not want  The following portions of the patient's history were reviewed and updated as appropriate: allergies, current medications, past family history, past medical history, past social history, past surgical history and problem list.  Review of Systems Pertinent items are noted in HPI.   Objective:  BP 135/66   Pulse 74   Wt 136 lb 12.8 oz (62.1 kg)   BMI 25.02 kg/m  CONSTITUTIONAL: Well-developed, well-nourished female in no acute distress.  HENT:  Normocephalic, atraumatic, External right and left ear normal. Oropharynx is clear and moist EYES:  No scleral icterus.  NECK: Normal range of motion, supple, no masses.  Normal thyroid.  SKIN: Skin is warm and dry. No rash noted. Not diaphoretic. No erythema. No pallor. NEUROLOGIC: Alert and oriented to person, place, and time. Normal reflexes, muscle tone coordination. No cranial nerve deficit noted. PSYCHIATRIC: Normal mood and affect. Normal behavior. Normal judgment and thought content. CARDIOVASCULAR: Normal heart rate noted, regular rhythm. 2+ distal pulses. RESPIRATORY: Effort and breath sounds normal, no problems with respiration noted. BREASTS: Symmetric in size. No masses, skin changes, nipple drainage, or lymphadenopathy. ABDOMEN: Soft,  no distention noted.  No tenderness, rebound or guarding.  PELVIC: Normal appearing external genitalia; atrophic vaginal mucosa.  No abnormal discharge  noted.  Surgically absent uterus and cervix.  MUSCULOSKELETAL: Normal range of motion.    Assessment and Plan:  1) Annual gynecologic examination - no indication for pap smear (s/p hysterectomy) - Declines mammogram despite counseling. Does perform self breast exam and clinical breast exam today was WNL - Discussed preventative health measures in detail. 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   Return if symptoms worsen or fail to improve, for Yearly wellness exam.  Caren Macadam, MD, MPH, ABFM Attending [place] for [facility]

## 2018-03-08 DIAGNOSIS — D2272 Melanocytic nevi of left lower limb, including hip: Secondary | ICD-10-CM | POA: Diagnosis not present

## 2018-03-08 DIAGNOSIS — R202 Paresthesia of skin: Secondary | ICD-10-CM | POA: Diagnosis not present

## 2018-03-08 DIAGNOSIS — D2262 Melanocytic nevi of left upper limb, including shoulder: Secondary | ICD-10-CM | POA: Diagnosis not present

## 2018-03-08 DIAGNOSIS — L570 Actinic keratosis: Secondary | ICD-10-CM | POA: Diagnosis not present

## 2018-03-08 DIAGNOSIS — D225 Melanocytic nevi of trunk: Secondary | ICD-10-CM | POA: Diagnosis not present

## 2018-03-08 DIAGNOSIS — D2261 Melanocytic nevi of right upper limb, including shoulder: Secondary | ICD-10-CM | POA: Diagnosis not present

## 2018-03-08 DIAGNOSIS — D2271 Melanocytic nevi of right lower limb, including hip: Secondary | ICD-10-CM | POA: Diagnosis not present

## 2018-03-08 DIAGNOSIS — L814 Other melanin hyperpigmentation: Secondary | ICD-10-CM | POA: Diagnosis not present

## 2018-03-08 DIAGNOSIS — X32XXXA Exposure to sunlight, initial encounter: Secondary | ICD-10-CM | POA: Diagnosis not present

## 2018-03-09 ENCOUNTER — Other Ambulatory Visit: Payer: Self-pay

## 2018-03-09 ENCOUNTER — Ambulatory Visit (AMBULATORY_SURGERY_CENTER): Payer: Self-pay | Admitting: *Deleted

## 2018-03-09 ENCOUNTER — Encounter: Payer: Medicare Other | Admitting: Gastroenterology

## 2018-03-09 VITALS — Ht 62.5 in | Wt 136.0 lb

## 2018-03-09 DIAGNOSIS — Z800 Family history of malignant neoplasm of digestive organs: Secondary | ICD-10-CM

## 2018-03-09 DIAGNOSIS — Z86010 Personal history of colonic polyps: Secondary | ICD-10-CM

## 2018-03-09 MED ORDER — NA SULFATE-K SULFATE-MG SULF 17.5-3.13-1.6 GM/177ML PO SOLN: ORAL | 0 refills | Status: DC

## 2018-03-09 NOTE — Progress Notes (Signed)
Patient denies any allergies to eggs or soy. Patient denies any problems with anesthesia/sedation. Patient denies any oxygen use at home. Patient denies taking any diet/weight loss medications or blood thinners. EMMI education assisgned to patient on colonoscopy, this was explained and instructions given to patient. 

## 2018-03-14 ENCOUNTER — Encounter: Payer: Self-pay | Admitting: Gastroenterology

## 2018-03-14 ENCOUNTER — Ambulatory Visit (AMBULATORY_SURGERY_CENTER): Payer: Medicare Other | Admitting: Gastroenterology

## 2018-03-14 VITALS — BP 150/73 | HR 76 | Temp 98.0°F | Resp 13 | Ht 62.0 in | Wt 136.0 lb

## 2018-03-14 DIAGNOSIS — Z86010 Personal history of colonic polyps: Secondary | ICD-10-CM | POA: Diagnosis present

## 2018-03-14 DIAGNOSIS — D122 Benign neoplasm of ascending colon: Secondary | ICD-10-CM

## 2018-03-14 DIAGNOSIS — D123 Benign neoplasm of transverse colon: Secondary | ICD-10-CM

## 2018-03-14 DIAGNOSIS — K635 Polyp of colon: Secondary | ICD-10-CM

## 2018-03-14 DIAGNOSIS — Z800 Family history of malignant neoplasm of digestive organs: Secondary | ICD-10-CM

## 2018-03-14 DIAGNOSIS — D125 Benign neoplasm of sigmoid colon: Secondary | ICD-10-CM

## 2018-03-14 DIAGNOSIS — K219 Gastro-esophageal reflux disease without esophagitis: Secondary | ICD-10-CM | POA: Diagnosis not present

## 2018-03-14 DIAGNOSIS — F419 Anxiety disorder, unspecified: Secondary | ICD-10-CM | POA: Diagnosis not present

## 2018-03-14 MED ORDER — METRONIDAZOLE 500 MG PO TABS: 500.0000 mg | ORAL_TABLET | Freq: Three times a day (TID) | ORAL | 0 refills | Status: DC

## 2018-03-14 MED ORDER — CIPROFLOXACIN HCL 500 MG PO TABS: 500.0000 mg | ORAL_TABLET | Freq: Two times a day (BID) | ORAL | 0 refills | Status: DC

## 2018-03-14 MED ORDER — SODIUM CHLORIDE 0.9 % IV SOLN: 500.0000 mL | Freq: Once | INTRAVENOUS | Status: DC

## 2018-03-14 NOTE — Op Note (Signed)
[facility] Patient Name: Kayla Gallegos Procedure Date: 03/14/2018 3:50 PM MRN: [id number] Endoscopist: Mauri Pole , MD Age: 67 Referring MD:  Date of Birth: 1951/02/09 Gender: Female Account #: [id number] Procedure:                Colonoscopy Indications:              Screening in patient at increased risk: Family                            history of 1st-degree relative with colorectal                            cancer, High risk colon cancer surveillance:                            Personal history of colonic polyps Medicines:                Monitored Anesthesia Care Procedure:                Pre-Anesthesia Assessment:                           - Prior to the procedure, a History and Physical                            was performed, and patient medications and                            allergies were reviewed. The patient's tolerance of                            previous anesthesia was also reviewed. The risks                            and benefits of the procedure and the sedation                            options and risks were discussed with the patient.                            All questions were answered, and informed consent                            was obtained. Prior Anticoagulants: The patient has                            taken no previous anticoagulant or antiplatelet                            agents. ASA Grade Assessment: II - A patient with                            mild systemic disease. After reviewing the risks  and benefits, the patient was deemed in                            satisfactory condition to undergo the procedure.                           After obtaining informed consent, the colonoscope                            was passed under direct vision. Throughout the                            procedure, the patient's blood pressure, pulse, and                            oxygen saturations were monitored  continuously. The                            Colonoscope was introduced through the anus and                            advanced to the the cecum, identified by                            appendiceal orifice and ileocecal valve. The                            colonoscopy was performed without difficulty. The                            patient tolerated the procedure well. The quality                            of the bowel preparation was good. The ileocecal                            valve, appendiceal orifice, and rectum were                            photographed. Scope In: 3:53:25 PM Scope Out: 4:21:02 PM Scope Withdrawal Time: 0 hours 17 minutes 28 seconds  Total Procedure Duration: 0 hours 27 minutes 37 seconds  Findings:                 The perianal and digital rectal examinations were                            normal.                           A 7 mm polyp was found in the ascending colon. The                            polyp was sessile. The polyp was removed with a  cold snare. Resection and retrieval were complete.                           A 1 mm polyp was found in the transverse colon. The                            polyp was sessile. The polyp was removed with a                            cold biopsy forceps. Resection and retrieval were                            complete.                           A 3 mm polyp was found in the sigmoid colon. The                            polyp was multi-lobulated, vascular adjacent to                            diverticula. The polyp was removed with a cold                            biopsy forceps. Resection and retrieval were                            complete. To prevent bleeding after the                            polypectomy, one hemostatic clip was successfully                            placed (MR conditional). There was no bleeding at                            the end of the procedure.                            Multiple small and large-mouthed diverticula were                            found in the sigmoid colon and descending colon.                            Peri-diverticular erythema was seen. There was                            evidence of an impacted diverticulum. Petechia were                            visualized in association with the diverticular  opening. Purulent discharge was seen in association                            with the diverticular opening, suspicious of                            diverticulitis in the sigmoid colon.                           Non-bleeding internal hemorrhoids were found during                            retroflexion. The hemorrhoids were small. Complications:            No immediate complications. Estimated Blood Loss:     Estimated blood loss was minimal. Impression:               - One 7 mm polyp in the ascending colon, removed                            with a cold snare. Resected and retrieved.                           - One 1 mm polyp in the transverse colon, removed                            with a cold biopsy forceps. Resected and retrieved.                           - One 3 mm polyp in the sigmoid colon, removed with                            a cold biopsy forceps. Resected and retrieved. Clip                            (MR conditional) was placed.                           - Moderate diverticulosis in the sigmoid colon and                            in the descending colon. Peri-diverticular erythema                            was seen. There was evidence of an impacted                            diverticulum. Petechia were visualized in                            association with the diverticular opening. Purulent                            discharge was seen in association with the  diverticular opening, suspicious of diverticulitis.                           - Non-bleeding  internal hemorrhoids. Recommendation:           - Patient has a contact number available for                            emergencies. The signs and symptoms of potential                            delayed complications were discussed with the                            patient. Return to normal activities tomorrow.                            Written discharge instructions were provided to the                            patient.                           - Resume previous diet.                           - Continue present medications.                           - Await pathology results.                           - Repeat colonoscopy in 3 - 5 years for                            surveillance based on pathology results.                           - Cipro (ciprofloxacin) 500 mg PO BID for 5 days.                           - Flagyl 500mg  TID for 5 days Mauri Pole, MD 03/14/2018 4:28:19 PM This report has been signed electronically.

## 2018-03-14 NOTE — Progress Notes (Signed)
Called to room to assist during endoscopic procedure.  Patient ID and intended procedure confirmed with present staff. Received instructions for my participation in the procedure from the performing physician.  

## 2018-03-14 NOTE — Progress Notes (Signed)
Pt's states no medical or surgical changes since previsit or office visit. 

## 2018-03-14 NOTE — Patient Instructions (Signed)
Impression/Recommendations:  Polyp handout given to patient. Diverticulosis handout given to patient. Hemorrhoid handout given to patient.  Resume previous diet. Continue present medications.YOU HAD AN ENDOSCOPIC PROCEDURE TODAY AT [facility]:   Refer to the procedure report that was given to you for any specific questions about what was found during the examination.  If the procedure report does not answer your questions, please call your gastroenterologist to clarify.  If you requested that your care partner not be given the details of your procedure findings, then the procedure report has been included in a sealed envelope for you to review at your convenience later.  YOU SHOULD EXPECT: Some feelings of bloating in the abdomen. Passage of more gas than usual.  Walking can help get rid of the air that was put into your GI tract during the procedure and reduce the bloating. If you had a lower endoscopy (such as a colonoscopy or flexible sigmoidoscopy) you may notice spotting of blood in your stool or on the toilet paper. If you underwent a bowel prep for your procedure, you may not have a normal bowel movement for a few days.  Please Note:  You might notice some irritation and congestion in your nose or some drainage.  This is from the oxygen used during your procedure.  There is no need for concern and it should clear up in a day or so.  SYMPTOMS TO REPORT IMMEDIATELY:   Following lower endoscopy (colonoscopy or flexible sigmoidoscopy):  Excessive amounts of blood in the stool  Significant tenderness or worsening of abdominal pains  Swelling of the abdomen that is new, acute  Fever of 100F or higher  For urgent or emergent issues, a gastroenterologist can be reached at any hour by calling [phone].   DIET:  We do recommend a small meal at first, but then you may proceed to your regular diet.  Drink plenty of fluids but you should avoid alcoholic beverages for 24  hours.  ACTIVITY:  You should plan to take it easy for the rest of today and you should NOT DRIVE or use heavy machinery until tomorrow (because of the sedation medicines used during the test).    FOLLOW UP: Our staff will call the number listed on your records the next business day following your procedure to check on you and address any questions or concerns that you may have regarding the information given to you following your procedure. If we do not reach you, we will leave a message.  However, if you are feeling well and you are not experiencing any problems, there is no need to return our call.  We will assume that you have returned to your regular daily activities without incident.  If any biopsies were taken you will be contacted by phone or by letter within the next 1-3 weeks.  Please call us at [phone] if you have not heard about the biopsies in 3 weeks.    SIGNATURES/CONFIDENTIALITY: You and/or your care partner have signed paperwork which will be entered into your electronic medical record.  These signatures attest to the fact that that the information above on your After Visit Summary has been reviewed and is understood.  Full responsibility of the confidentiality of this discharge information lies with you and/or your care-partner.  Repeat colonoscopy in 3-5 years for surveillance based on pathology results.  Cipro 500 mg. By mouth 2 times daily for 5 days. Flagyl 500 mg. 3 times daily for 5 days.

## 2018-03-14 NOTE — Progress Notes (Signed)
Report given to PACU, vss 

## 2018-03-15 ENCOUNTER — Telehealth: Payer: Self-pay | Admitting: *Deleted

## 2018-03-15 NOTE — Telephone Encounter (Signed)
  Follow up Call-  Call back number 03/14/2018  Post procedure Call Back phone  # [phone]  Permission to leave phone message Yes  Some recent data might be hidden     Patient questions:  Do you have a fever, pain , or abdominal swelling? No. Pain Score  0 *  Have you tolerated food without any problems? Yes.    Have you been able to return to your normal activities? Yes.    Do you have any questions about your discharge instructions: Diet   No. Medications  Yes.   Follow up visit  No.  Do you have questions or concerns about your Care? No.  Actions: * If pain score is 4 or above: No action needed, pain <4.   On f/u call, pt asked if she was supposed to take C Cipro and Flagyl at the same time or wait until finishes one to start the other one .

## 2018-03-15 NOTE — Telephone Encounter (Signed)
Called patient back. She had an area suspicious for possible diverticulitis. Given she is currently asymptomatic, advised patient to hold antibiotics, doesn't have to start unless she has any symptoms.

## 2018-03-17 ENCOUNTER — Telehealth: Payer: Self-pay | Admitting: Internal Medicine

## 2018-03-17 NOTE — Telephone Encounter (Signed)
Pt called yesterday evening with abd pain Had colonoscopy 2 days prior to the call.  Few small polyps removed, also inflammation with concern for diverticulitis which she is being treated for with cipro/metronidazole Pain is not improve, rather diffuse, small BM, bloating, some nausea No vomiting, fever, bleeding Decreased appetite but able to tolerate liquids  I recommended to the ER if worsening tonight, otherwise office to contact her for followup today I recommended to her if not better to come for CBC, BMET, and 2v abd xray Beth, please contact and facilitate Thanks JMP

## 2018-03-17 NOTE — Telephone Encounter (Signed)
I have called the patient who reports she is feeling better. She is up and moving around. Drinking her normal morning beverage of ginger tea. She is afebrile. Denies nausea or pain. States not as bloated. She is taking antibiotics. She has my name and agrees to call if she acutely worsens or fails to improve.

## 2018-03-17 NOTE — Telephone Encounter (Signed)
Ok thanks 

## 2018-03-17 NOTE — Telephone Encounter (Signed)
Beth, can you please check if patient started taking the antibiotics?Thanks

## 2018-03-22 ENCOUNTER — Encounter: Payer: Self-pay | Admitting: Gastroenterology

## 2018-04-22 ENCOUNTER — Encounter: Payer: Self-pay | Admitting: Primary Care

## 2018-04-22 ENCOUNTER — Ambulatory Visit (INDEPENDENT_AMBULATORY_CARE_PROVIDER_SITE_OTHER): Payer: Medicare Other | Admitting: Primary Care

## 2018-04-22 VITALS — BP 134/62 | HR 81 | Temp 98.2°F | Wt 133.5 lb

## 2018-04-22 DIAGNOSIS — R05 Cough: Secondary | ICD-10-CM | POA: Diagnosis not present

## 2018-04-22 DIAGNOSIS — R059 Cough, unspecified: Secondary | ICD-10-CM

## 2018-04-22 NOTE — Progress Notes (Signed)
Subjective:    Patient ID: Kayla Gallegos, female    DOB: [birth year], 67 y.o.   MRN: [id number]  HPI  Kayla Gallegos is a 67 year old female with a history of allergic rhinitis and GERD who presents today with a chief complaint of cough.  She also reports bilateral ear pain, chest congestion. She denies sore throat, fevers. Her husband has been ill with similar symptoms, diagnosed with pneumonia (no chest xray). Her symptoms began yesterday. She's taken Mucinex with some improvement in cough.   Review of Systems  Constitutional: Negative for fever.  HENT: Positive for congestion. Negative for sinus pressure and sore throat.   Respiratory: Positive for cough.   Cardiovascular: Negative for chest pain.       Past Medical History:  Diagnosis Date  . Hyperlipidemia   . Kidney stones   . Recurrent UTI (urinary tract infection)      Social History   Socioeconomic History  . Marital status: Married    Spouse name: Not on file  . Number of children: Not on file  . Years of education: Not on file  . Highest education level: Not on file  Occupational History  . Not on file  Social Needs  . Financial resource strain: Not on file  . Food insecurity:    Worry: Not on file    Inability: Not on file  . Transportation needs:    Medical: Not on file    Non-medical: Not on file  Tobacco Use  . Smoking status: Never Smoker  . Smokeless tobacco: Never Used  Substance and Sexual Activity  . Alcohol use: No  . Drug use: No  . Sexual activity: Not on file  Lifestyle  . Physical activity:    Days per week: Not on file    Minutes per session: Not on file  . Stress: Not on file  Relationships  . Social connections:    Talks on phone: Not on file    Gets together: Not on file    Attends religious service: Not on file    Active member of club or organization: Not on file    Attends meetings of clubs or organizations: Not on file    Relationship status: Not on file  . Intimate partner  violence:    Fear of current or ex partner: Not on file    Emotionally abused: Not on file    Physically abused: Not on file    Forced sexual activity: Not on file  Other Topics Concern  . Not on file  Social History Narrative  . Not on file    Past Surgical History:  Procedure Laterality Date  . ABDOMINAL HYSTERECTOMY  1976  . COLONOSCOPY    . LAPAROSCOPIC CHOLECYSTECTOMY  1990  . TONSILLECTOMY      Family History  Problem Relation Age of Onset  . Colon cancer Sister 13  . Colon cancer Brother 65  . Stomach cancer Neg Hx     Allergies  Allergen Reactions  . Codeine Nausea And Vomiting and Nausea Only    dizziness    Current Outpatient Medications on File Prior to Visit  Medication Sig Dispense Refill  . ascorbic acid (VITAMIN C) 500 MG tablet Take by mouth daily.    . B Complex Vitamins (VITAMIN-B COMPLEX PO) Take by mouth daily.    Marland Kitchen estradiol (ESTRACE) 0.1 MG/GM vaginal cream Apply 1 gram per vagina every night for 2 weeks, then apply three times a week 30  g 12  . FOLIC ACID PO Take by mouth daily.    Marland Kitchen MAGNESIUM CARBONATE PO Take 1 capsule by mouth daily.    . Probiotic Product (PROBIOTIC DAILY PO) Take by mouth daily.     Current Facility-Administered Medications on File Prior to Visit  Medication Dose Route Frequency Provider Last Rate Last Dose  . 0.9 %  sodium chloride infusion  500 mL Intravenous Once Nandigam, Kavitha V, MD        BP 134/62   Pulse 81   Temp 98.2 F (36.8 C) (Oral)   Wt 133 lb 8 oz (60.6 kg)   SpO2 98%   BMI 24.42 kg/m    Objective:   Physical Exam  Constitutional: She appears well-nourished. She does not appear ill.  HENT:  Right Ear: Tympanic membrane and ear canal normal.  Left Ear: Tympanic membrane and ear canal normal.  Nose: No mucosal edema. Right sinus exhibits no maxillary sinus tenderness and no frontal sinus tenderness. Left sinus exhibits no maxillary sinus tenderness and no frontal sinus tenderness.  Mouth/Throat:  Oropharynx is clear and moist.  Neck: Neck supple.  Cardiovascular: Normal rate and regular rhythm.  Respiratory: Effort normal and breath sounds normal. She has no wheezes.  Skin: Skin is warm and dry.           Assessment & Plan:  URI:  Cough, congestion x 1 day. Exposed to husband that was diagnosed with CAP, no chest xray to confirm. She appears well, lungs clear, vitals WNL. Continue Mucinex as she's noticed improvement.  Discussed that she may feel worse before feeling better. Also provided strict return precautions for which she verbalized understanding.  Pleas Koch, NP

## 2018-04-22 NOTE — Patient Instructions (Signed)
Continue Mucinex as needed for cough and congestion.  Make sure to drink plenty of water.  Please call me if you develop persistent fevers of 101 or higher and feel worse.   Symptoms should start improving by mid next week. Call me if you feel worse on Friday next week.  It was a pleasure to see you today!

## 2018-05-11 ENCOUNTER — Ambulatory Visit (INDEPENDENT_AMBULATORY_CARE_PROVIDER_SITE_OTHER): Payer: Medicare Other | Admitting: Family Medicine

## 2018-05-11 ENCOUNTER — Encounter: Payer: Self-pay | Admitting: Family Medicine

## 2018-05-11 VITALS — BP 112/60 | HR 69 | Temp 98.4°F | Ht 62.0 in | Wt 132.8 lb

## 2018-05-11 DIAGNOSIS — K219 Gastro-esophageal reflux disease without esophagitis: Secondary | ICD-10-CM | POA: Diagnosis not present

## 2018-05-11 MED ORDER — OMEPRAZOLE 20 MG PO CPDR: 20.0000 mg | DELAYED_RELEASE_CAPSULE | Freq: Every day | ORAL | 1 refills | Status: DC

## 2018-05-11 NOTE — Progress Notes (Signed)
Dr. Frederico Hamman T. Taksh Hjort, MD, [facility] [place], [address] Phone: [phone] Fax: [phone]  05/11/2018  Patient: Kayla Gallegos, MRN: [id number], DOB: 26-Oct-1951, 67 y.o.  Primary Physician:  Pleas Koch, NP   Chief Complaint  Patient presents with  . Cough    ? Acid Reflux  . Red Spots on throat   Subjective:   Kayla Gallegos is a 67 y.o. very pleasant female patient who presents with the following:  Seen ENT before a few times, not getting any sleep. Sleeping on an incline.  Feels like a dry spot on throat and coughing a lot more. Cough and from throat.   Pleasant elderly lady, 28, she presents with some intermittent reflux symptoms.  This is been ongoing now for well more than a decade.  She previously saw Dr. Maurene Capes, she has had multiple endoscopies, and she has been on various treatments for reflux in the past.  Currently she is not taking any at all.  She is having symptoms, she is also had some intermittent coughing.  She is not a smoker.  She is never had any asthma.  She does not have any known history of COPD.  Past Medical History, Surgical History, Social History, Family History, Problem List, Medications, and Allergies have been reviewed and updated if relevant.  Patient Active Problem List   Diagnosis Date Noted  . Vaginal dryness 10/18/2017  . Recurrent UTI 09/30/2016  . Encounter for annual general medical examination with abnormal findings in adult 09/30/2016  . PARESTHESIA 04/01/2009  . HEMATURIA UNSPECIFIED 05/30/2008  . Hyperlipidemia 05/29/2008  . ANXIETY 05/29/2008  . DEPRESSION 05/29/2008  . ALLERGIC RHINITIS 05/29/2008  . GERD 05/29/2008  . DIVERTICULOSIS, COLON 05/29/2008  . IBS 05/29/2008  . INSOMNIA-SLEEP DISORDER-UNSPEC 05/29/2008    Past Medical History:  Diagnosis Date  . Hyperlipidemia   . Kidney stones   . Recurrent UTI (urinary tract infection)     Past  Surgical History:  Procedure Laterality Date  . ABDOMINAL HYSTERECTOMY  1976  . COLONOSCOPY    . LAPAROSCOPIC CHOLECYSTECTOMY  1990  . TONSILLECTOMY      Social History   Socioeconomic History  . Marital status: Married    Spouse name: Not on file  . Number of children: Not on file  . Years of education: Not on file  . Highest education level: Not on file  Occupational History  . Not on file  Social Needs  . Financial resource strain: Not on file  . Food insecurity:    Worry: Not on file    Inability: Not on file  . Transportation needs:    Medical: Not on file    Non-medical: Not on file  Tobacco Use  . Smoking status: Never Smoker  . Smokeless tobacco: Never Used  Substance and Sexual Activity  . Alcohol use: No  . Drug use: No  . Sexual activity: Not on file  Lifestyle  . Physical activity:    Days per week: Not on file    Minutes per session: Not on file  . Stress: Not on file  Relationships  . Social connections:    Talks on phone: Not on file    Gets together: Not on file    Attends religious service: Not on file    Active member of club or organization: Not on file    Attends meetings of clubs or organizations: Not on file  Relationship status: Not on file  . Intimate partner violence:    Fear of current or ex partner: Not on file    Emotionally abused: Not on file    Physically abused: Not on file    Forced sexual activity: Not on file  Other Topics Concern  . Not on file  Social History Narrative  . Not on file    Family History  Problem Relation Age of Onset  . Colon cancer Sister 12  . Colon cancer Brother 61  . Stomach cancer Neg Hx     Allergies  Allergen Reactions  . Codeine Nausea And Vomiting and Nausea Only    dizziness    Medication list reviewed and updated in full in [place].   GEN: No acute illnesses, no fevers, chills. GI: No n/v/d, eating normally Pulm: No SOB Interactive and getting along well at  home.  Otherwise, ROS is as per the HPI.  Objective:   BP 112/60   Pulse 69   Temp 98.4 F (36.9 C) (Oral)   Ht 5\' 2"  (1.575 m)   Wt 132 lb 12 oz (60.2 kg)   BMI 24.28 kg/m   GEN: WDWN, NAD, Non-toxic, A & O x 3 HEENT: Atraumatic, Normocephalic. Neck supple. No masses, No LAD. Ears and Nose: No external deformity. CV: RRR, No M/G/R. No JVD. No thrill. No extra heart sounds. PULM: CTA B, no wheezes, crackles, rhonchi. No retractions. No resp. distress. No accessory muscle use. ABD: S, NT, ND, + BS, No rebound, No HSM  EXTR: No c/c/e NEURO Normal gait.  PSYCH: Normally interactive. Conversant. Not depressed or anxious appearing.  Calm demeanor.   Laboratory and Imaging Data:  Assessment and Plan:   Gastroesophageal reflux disease, esophagitis presence not specified  Most likely reflux.  Start omeprazole, diet change, cannot fully rule out postnasal drip, but other causes of chronic cough are much less likely.  Follow-up: No follow-ups on file.  Meds ordered this encounter  Medications  . omeprazole (PRILOSEC) 20 MG capsule    Sig: Take 1 capsule (20 mg total) by mouth daily.    Dispense:  90 capsule    Refill:  1   There are no discontinued medications. No orders of the defined types were placed in this encounter.   Signed,  Maud Deed. Damaree Sargent, MD   Allergies as of 05/11/2018      Reactions   Codeine Nausea And Vomiting, Nausea Only   dizziness      Medication List        Accurate as of 05/11/18 11:59 PM. Always use your most recent med list.          ascorbic acid 500 MG tablet Commonly known as:  VITAMIN C Take by mouth daily.   estradiol 0.1 MG/GM vaginal cream Commonly known as:  ESTRACE Apply 1 gram per vagina every night for 2 weeks, then apply three times a week   FOLIC ACID PO Take by mouth daily.   MAGNESIUM CARBONATE PO Take 1 capsule by mouth daily.   omeprazole 20 MG capsule Commonly known as:  PRILOSEC Take 1 capsule (20 mg  total) by mouth daily.   PROBIOTIC DAILY PO Take by mouth daily.   VITAMIN-B COMPLEX PO Take by mouth daily.

## 2018-06-27 DIAGNOSIS — M5416 Radiculopathy, lumbar region: Secondary | ICD-10-CM | POA: Insufficient documentation

## 2018-06-30 ENCOUNTER — Other Ambulatory Visit: Payer: Self-pay | Admitting: Neurological Surgery

## 2018-06-30 DIAGNOSIS — M5416 Radiculopathy, lumbar region: Secondary | ICD-10-CM

## 2018-07-02 ENCOUNTER — Ambulatory Visit
Admission: RE | Admit: 2018-07-02 | Discharge: 2018-07-02 | Disposition: A | Discharge status: Home / Self Care | Payer: Medicare Other | Source: Ambulatory Visit | Attending: Neurological Surgery | Admitting: Neurological Surgery

## 2018-07-02 DIAGNOSIS — M48061 Spinal stenosis, lumbar region without neurogenic claudication: Secondary | ICD-10-CM | POA: Diagnosis not present

## 2018-07-02 DIAGNOSIS — M5116 Intervertebral disc disorders with radiculopathy, lumbar region: Secondary | ICD-10-CM | POA: Diagnosis not present

## 2018-07-02 DIAGNOSIS — M5416 Radiculopathy, lumbar region: Secondary | ICD-10-CM

## 2018-07-13 DIAGNOSIS — M5416 Radiculopathy, lumbar region: Secondary | ICD-10-CM | POA: Diagnosis not present

## 2018-07-22 DIAGNOSIS — M5416 Radiculopathy, lumbar region: Secondary | ICD-10-CM | POA: Diagnosis not present

## 2018-07-27 ENCOUNTER — Emergency Department: Payer: Medicare Other

## 2018-07-27 ENCOUNTER — Emergency Department
Admission: EM | Admit: 2018-07-27 | Discharge: 2018-07-27 | Disposition: A | Discharge status: Home / Self Care | Payer: Medicare Other | Attending: Student in an Organized Health Care Education/Training Program | Admitting: Student in an Organized Health Care Education/Training Program

## 2018-07-27 ENCOUNTER — Other Ambulatory Visit: Payer: Self-pay

## 2018-07-27 DIAGNOSIS — K5903 Drug induced constipation: Secondary | ICD-10-CM | POA: Diagnosis not present

## 2018-07-27 DIAGNOSIS — Z79899 Other long term (current) drug therapy: Secondary | ICD-10-CM | POA: Insufficient documentation

## 2018-07-27 DIAGNOSIS — K5900 Constipation, unspecified: Secondary | ICD-10-CM | POA: Diagnosis not present

## 2018-07-27 MED ORDER — LACTULOSE 10 G PO PACK: 10.0000 g | PACK | Freq: Three times a day (TID) | ORAL | 0 refills | Status: DC

## 2018-07-27 MED ORDER — MAGNESIUM CITRATE PO SOLN
1.0000 | Freq: Once | ORAL | Status: AC
  Administered 2018-07-27: 1 via ORAL
  Filled 2018-07-27: qty 296

## 2018-07-27 NOTE — ED Provider Notes (Signed)
[facility] Emergency Department Provider Note    First MD Initiated Contact with Patient 07/27/18 2112     (approximate)  I have reviewed the triage vital signs and the nursing notes.   HISTORY  Chief Complaint Constipation    HPI Kayla Gallegos is a 67 y.o. female status post recent minimally invasive lumbar surgery presents to the ER for constipation not been able to move her bowels for several days.  Called her PCP and physician who told her to increase Dulcolax which she took.  States that she fell earlier today that she was having difficulty passing stool.  She got some oral and had to manually disimpact herself.  States that after using the Dulcolax upon arrival to the ER she did had a large volume defecation with improvement in symptoms.    Past Medical History:  Diagnosis Date  . Hyperlipidemia   . Kidney stones   . Recurrent UTI (urinary tract infection)    Family History  Problem Relation Age of Onset  . Colon cancer Sister 73  . Colon cancer Brother 68  . Stomach cancer Neg Hx    Past Surgical History:  Procedure Laterality Date  . ABDOMINAL HYSTERECTOMY  1976  . COLONOSCOPY    . LAPAROSCOPIC CHOLECYSTECTOMY  1990  . TONSILLECTOMY     Patient Active Problem List   Diagnosis Date Noted  . Vaginal dryness 10/18/2017  . Recurrent UTI 09/30/2016  . Encounter for annual general medical examination with abnormal findings in adult 09/30/2016  . PARESTHESIA 04/01/2009  . HEMATURIA UNSPECIFIED 05/30/2008  . Hyperlipidemia 05/29/2008  . ANXIETY 05/29/2008  . DEPRESSION 05/29/2008  . ALLERGIC RHINITIS 05/29/2008  . GERD 05/29/2008  . DIVERTICULOSIS, COLON 05/29/2008  . IBS 05/29/2008  . INSOMNIA-SLEEP DISORDER-UNSPEC 05/29/2008      Prior to Admission medications   Medication Sig Start Date End Date Taking? Authorizing Provider  ascorbic acid (VITAMIN C) 500 MG tablet Take by mouth daily.    [provider]  B Complex  Vitamins (VITAMIN-B COMPLEX PO) Take by mouth daily.    [provider]  estradiol (ESTRACE) 0.1 MG/GM vaginal cream Apply 1 gram per vagina every night for 2 weeks, then apply three times a week 02/02/18   Caren Macadam, MD  FOLIC ACID PO Take by mouth daily.    [provider]  lactulose (CEPHULAC) 10 g packet Take 1 packet (10 g total) by mouth 3 (three) times daily. 07/27/18   Merlyn Lot, MD  MAGNESIUM CARBONATE PO Take 1 capsule by mouth daily.    [provider]  omeprazole (PRILOSEC) 20 MG capsule Take 1 capsule (20 mg total) by mouth daily. 05/11/18   Copland, Frederico Hamman, MD  Probiotic Product (PROBIOTIC DAILY PO) Take by mouth daily.    [provider]    Allergies Codeine    Social History Social History   Tobacco Use  . Smoking status: Never Smoker  . Smokeless tobacco: Never Used  Substance Use Topics  . Alcohol use: No  . Drug use: No    Review of Systems Patient denies headaches, rhinorrhea, blurry vision, numbness, shortness of breath, chest pain, edema, cough, abdominal pain, nausea, vomiting, diarrhea, dysuria, fevers, rashes or hallucinations unless otherwise stated above in HPI. ____________________________________________   PHYSICAL EXAM:  VITAL SIGNS: Vitals:   07/27/18 2110  BP: 139/72  Pulse: (!) 101  Resp: 17  Temp: 98.3 F (36.8 C)  SpO2: 99%    Constitutional: Alert and  oriented.  Eyes: Conjunctivae are normal.  Head: Atraumatic. Nose: No congestion/rhinnorhea. Mouth/Throat: Mucous membranes are moist.   Neck: No stridor. Painless ROM.  Cardiovascular: Normal rate, regular rhythm. Grossly normal heart sounds.  Good peripheral circulation. Respiratory: Normal respiratory effort.  No retractions. Lungs CTAB. Gastrointestinal: Soft and nontender. No distention. No abdominal bruits. No CVA tenderness. Genitourinary: deferred Musculoskeletal: No lower extremity tenderness nor edema.  No joint  effusions. Neurologic:  Normal speech and language. No gross focal neurologic deficits are appreciated. No facial droop Skin:  Skin is warm, dry and intact. No rash noted. Psychiatric: Mood and affect are normal. Speech and behavior are normal.  ____________________________________________   LABS (all labs ordered are listed, but only abnormal results are displayed)  No results found for this or any previous visit (from the past 24 hour(s)). ____________________________________________ ____________________________________________  CLEXNTZGY  I personally reviewed all radiographic images ordered to evaluate for the above acute complaints and reviewed radiology reports and findings.  These findings were personally discussed with the patient.  Please see medical record for radiology report.  ____________________________________________   PROCEDURES  Procedure(s) performed:  Procedures    Critical Care performed: no ____________________________________________   INITIAL IMPRESSION / ASSESSMENT AND PLAN / ED COURSE  Pertinent labs & imaging results that were available during my care of the patient were reviewed by me and considered in my medical decision making (see chart for details).   DDX: fecal impaction, constipation, medication effect  Kayla Gallegos is a 67 y.o. who presents to the ED with symptoms as described above.  She is afebrile Heema dynamically stable.  Symptoms improved after moving her bowels here in the ER.  Not consistent with impaction.  Does have large stool volume likely secondary to medication effect.  Will give mag citrate prescription for MiraLAX.      As part of my medical decision making, I reviewed the following data within the [facility] notes reviewed and incorporated, Labs reviewed, notes from prior ED visits.  ____________________________________________   FINAL CLINICAL IMPRESSION(S) / ED DIAGNOSES  Final  diagnoses:  Drug-induced constipation      NEW MEDICATIONS STARTED DURING THIS VISIT:  New Prescriptions   LACTULOSE (CEPHULAC) 10 G PACKET    Take 1 packet (10 g total) by mouth 3 (three) times daily.     Note:  This document was prepared using Dragon voice recognition software and may include unintentional dictation errors.    Merlyn Lot, MD 07/27/18 2204

## 2018-07-27 NOTE — ED Triage Notes (Addendum)
Pt arrives to ED via POV from home with c/o constipation. Pt reports "minimally invasive back surgery in [place] last week" and her last BM was just prior to Triage. Pt states last BM was Thursday of last week (just before the one before Triage). Pt denies abdominal pain, no CP or SHOB. Pt states her constipation issue might have resolved, but s/o wants her back checked out, "just in case".

## 2018-08-16 HISTORY — PX: BACK SURGERY: SHX140

## 2018-10-11 ENCOUNTER — Ambulatory Visit: Payer: Medicare Other | Admitting: Primary Care

## 2018-10-17 ENCOUNTER — Ambulatory Visit (INDEPENDENT_AMBULATORY_CARE_PROVIDER_SITE_OTHER): Payer: Medicare Other | Admitting: Internal Medicine

## 2018-10-17 ENCOUNTER — Encounter: Payer: Self-pay | Admitting: Internal Medicine

## 2018-10-17 VITALS — BP 130/80 | HR 74 | Temp 98.0°F | Wt 135.0 lb

## 2018-10-17 DIAGNOSIS — R300 Dysuria: Secondary | ICD-10-CM | POA: Diagnosis not present

## 2018-10-17 DIAGNOSIS — R8290 Unspecified abnormal findings in urine: Secondary | ICD-10-CM

## 2018-10-17 LAB — POC URINALSYSI DIPSTICK (AUTOMATED)
Bilirubin, UA: NEGATIVE
GLUCOSE UA: NEGATIVE
KETONES UA: NEGATIVE
Leukocytes, UA: NEGATIVE
Nitrite, UA: NEGATIVE
PROTEIN UA: NEGATIVE
RBC UA: NEGATIVE
SPEC GRAV UA: 1.01 (ref 1.010–1.025)
Urobilinogen, UA: 0.2 E.U./dL
pH, UA: 6 (ref 5.0–8.0)

## 2018-10-17 NOTE — Progress Notes (Signed)
HPI  Pt presents to the clinic today with c/o dysuria and urine odor. She reports this started 6 weeks ago. She denies urgency, frequency, or blood in her urine. She denies vaginal discharge, odor, itching, irritation or abnormal bleeding. She denies fever, chills, nausea or low back pain. She has tried AZO 6 weeks ago with some improvement. She has not tried anything additional OTC.   Review of Systems  Past Medical History:  Diagnosis Date  . Hyperlipidemia   . Kidney stones   . Recurrent UTI (urinary tract infection)     Family History  Problem Relation Age of Onset  . Colon cancer Sister 61  . Colon cancer Brother 62  . Stomach cancer Neg Hx     Social History   Socioeconomic History  . Marital status: Married    Spouse name: Not on file  . Number of children: Not on file  . Years of education: Not on file  . Highest education level: Not on file  Occupational History  . Not on file  Social Needs  . Financial resource strain: Not on file  . Food insecurity:    Worry: Not on file    Inability: Not on file  . Transportation needs:    Medical: Not on file    Non-medical: Not on file  Tobacco Use  . Smoking status: Never Smoker  . Smokeless tobacco: Never Used  Substance and Sexual Activity  . Alcohol use: No  . Drug use: No  . Sexual activity: Not on file  Lifestyle  . Physical activity:    Days per week: Not on file    Minutes per session: Not on file  . Stress: Not on file  Relationships  . Social connections:    Talks on phone: Not on file    Gets together: Not on file    Attends religious service: Not on file    Active member of club or organization: Not on file    Attends meetings of clubs or organizations: Not on file    Relationship status: Not on file  . Intimate partner violence:    Fear of current or ex partner: Not on file    Emotionally abused: Not on file    Physically abused: Not on file    Forced sexual activity: Not on file  Other Topics  Concern  . Not on file  Social History Narrative  . Not on file    Allergies  Allergen Reactions  . Codeine Nausea And Vomiting and Nausea Only    dizziness     Constitutional: Denies fever, malaise, fatigue, headache or abrupt weight changes.   GU: Pt reports urine odor, pain with urination. Denies urgency, frequency, burning sensation, blood in urine, or discharge. Skin: Denies redness, rashes, lesions or ulcercations.   No other specific complaints in a complete review of systems (except as listed in HPI above).    Objective:   Physical Exam  BP 130/80   Pulse 74   Temp 98 F (36.7 C) (Oral)   Wt 135 lb (61.2 kg)   SpO2 98%   BMI 24.69 kg/m   Wt Readings from Last 3 Encounters:  10/17/18 135 lb (61.2 kg)  07/27/18 130 lb (59 kg)  05/11/18 132 lb 12 oz (60.2 kg)    General: Appears her stated age, well developed, well nourished in NAD. Abdomen: Soft, nontender. Normal bowel sounds. No distention or masses noted.No CVA tenderness.        Assessment &  Plan:   Dysuria, Urine Odor:   Urinalysis: normal OK to take AZO OTC Drink plenty of fluids  RTC as needed or if symptoms persist. Webb Silversmith, NP

## 2018-10-17 NOTE — Addendum Note (Signed)
Addended by: Lurlean Nanny on: 10/17/2018 04:52 PM   Modules accepted: Orders

## 2018-10-17 NOTE — Patient Instructions (Signed)

## 2018-11-29 ENCOUNTER — Encounter: Payer: Self-pay | Admitting: Radiology

## 2018-12-01 ENCOUNTER — Other Ambulatory Visit
Admission: RE | Admit: 2018-12-01 | Discharge: 2018-12-01 | Disposition: A | Discharge status: Home / Self Care | Payer: Medicare Other | Source: Ambulatory Visit | Attending: Internal Medicine | Admitting: Internal Medicine

## 2018-12-01 ENCOUNTER — Encounter: Payer: Self-pay | Admitting: Internal Medicine

## 2018-12-01 ENCOUNTER — Institutional Professional Consult (permissible substitution): Payer: Medicare Other | Admitting: Pulmonary Disease

## 2018-12-01 ENCOUNTER — Ambulatory Visit
Admission: RE | Admit: 2018-12-01 | Discharge: 2018-12-01 | Disposition: A | Discharge status: Home / Self Care | Payer: Medicare Other | Source: Ambulatory Visit | Attending: Internal Medicine | Admitting: Internal Medicine

## 2018-12-01 ENCOUNTER — Ambulatory Visit (INDEPENDENT_AMBULATORY_CARE_PROVIDER_SITE_OTHER): Payer: Medicare Other | Admitting: Internal Medicine

## 2018-12-01 VITALS — BP 122/76 | HR 77 | Resp 16 | Ht 62.0 in | Wt 136.0 lb

## 2018-12-01 DIAGNOSIS — R079 Chest pain, unspecified: Secondary | ICD-10-CM

## 2018-12-01 DIAGNOSIS — R0609 Other forms of dyspnea: Secondary | ICD-10-CM | POA: Insufficient documentation

## 2018-12-01 DIAGNOSIS — K219 Gastro-esophageal reflux disease without esophagitis: Secondary | ICD-10-CM

## 2018-12-01 DIAGNOSIS — M549 Dorsalgia, unspecified: Secondary | ICD-10-CM | POA: Diagnosis not present

## 2018-12-01 LAB — FIBRIN DERIVATIVES D-DIMER (ARMC ONLY): Fibrin derivatives D-dimer (ARMC): 191.97 ng/mL (FEU) (ref 0.00–499.00)

## 2018-12-01 NOTE — Patient Instructions (Addendum)
Suggest taking famotidine (pepcid) daily for your reflux.  Will refer you to a heart doctor.

## 2018-12-01 NOTE — Progress Notes (Signed)
[facility] Consultation      Assessment and Plan:  Chest pain, back pain, sighing respirations. GERD. - Etiology of the patient's symptoms is uncertain. -I explained that GERD could cause some of the symptoms, she stopped taking PPI, I therefore recommended she start taking famotidine. - I am doubtful that she would have an acute etiology such as pulmonary embolism contribute to her symptoms, however she did have recent surgery therefore I will check a d-dimer. - I am less concerned about her chest pain particularly as it occurs only at rest and she walks 4 miles a day without symptoms, however will refer to cardiology for further work-up.  Date: 12/01/2018  MRN# [id number] Kayla Gallegos Kayla Gallegos, Kayla Gallegos    Kayla Gallegos is a 68 y.o. old female seen in consultation for chief complaint of:    Chief Complaint  Patient presents with  . Consult    Self referral: former Dr. Lenna Gilford patient. Pt c/o chest pain that has been ongoing since end of Nov 2019. She had back surgery 08/20/18 and is not sure if this is related. She doesn't have any pulmonary sx other than a sudden gasp for air.    HPI:   She presents today after she had lower back surgery in 08/2018. Since that time she has been having pain in her back and chest. Just sitting in her chair made her back burt. This has also been associated with occasional gasping for air. The gasping occurs internittently when she is sitting still, occurred a few times per day. It has been there for about 6 weeks. She notes that the pain is worse is something is pushing on it like the back of a chair.  She tells me about 3 years ago she was living in the [place] and went to the hospital there and had an MRI and CT scans and everything was normal.  She went to her PCP for reflux, she was prescribed omeprazole, she took it for 2 days because she stopped it for some unknown reason. She does not like to take medications.  She tells me  that she is under a lot of stress and thinks it could be contributing to her symptoms, she has a sick grand daughter and there are issues with her husband.    She is very active and continues to walk about 4 miles per day. No dyspnea, or chest pain during this.   PMHX:   Past Medical History:  Diagnosis Date  . Hyperlipidemia   . Kidney stones   . Recurrent UTI (urinary tract infection)    Surgical Hx:  Past Surgical History:  Procedure Laterality Date  . ABDOMINAL HYSTERECTOMY  1976  . COLONOSCOPY    . LAPAROSCOPIC CHOLECYSTECTOMY  1990  . TONSILLECTOMY     Family Hx:  Family History  Problem Relation Age of Onset  . Colon cancer Sister 79  . Colon cancer Brother 12  . Stomach cancer Neg Hx    Social Hx:   Social History   Tobacco Use  . Smoking status: Never Smoker  . Smokeless tobacco: Never Used  Substance Use Topics  . Alcohol use: No  . Drug use: No   Medication:    Current Outpatient Medications:  .  ascorbic acid (VITAMIN C) 500 MG tablet, Take by mouth daily., Disp: , Rfl:  .  B Complex Vitamins (VITAMIN-B COMPLEX PO), Take by mouth daily., Disp: , Rfl:  .  estradiol (ESTRACE) 0.1 MG/GM vaginal cream,  Apply 1 gram per vagina every night for 2 weeks, then apply three times a week, Disp: 30 g, Rfl: 12 .  FOLIC ACID PO, Take by mouth daily., Disp: , Rfl:  .  lactulose (CEPHULAC) 10 g packet, Take 1 packet (10 g total) by mouth 3 (three) times daily., Disp: 30 each, Rfl: 0 .  MAGNESIUM CARBONATE PO, Take 1 capsule by mouth daily., Disp: , Rfl:  .  Probiotic Product (PROBIOTIC DAILY PO), Take by mouth daily., Disp: , Rfl:   Current Facility-Administered Medications:  .  0.9 %  sodium chloride infusion, 500 mL, Intravenous, Once, Nandigam, Kavitha V, MD   Allergies:  Codeine  Review of Systems: Gen:  Denies  fever, sweats, chills HEENT: Denies blurred vision, double vision. bleeds, sore throat Cvc:  No dizziness, chest pain. Resp:   Denies cough or sputum  production, shortness of breath Gi: Denies swallowing difficulty, stomach pain. Gu:  Denies bladder incontinence, burning urine Ext:   No Joint pain, stiffness. Skin: No skin rash,  hives  Endoc:  No polyuria, polydipsia. Psych: No depression, insomnia. Other:  All other systems were reviewed with the patient and were negative other that what is mentioned in the HPI.   Physical Examination:   VS: BP 122/76 (BP Location: Left Arm, Cuff Size: Normal)   Pulse 77   Resp 16   Ht 5\' 2"  (1.575 m)   Wt 136 lb (61.7 kg)   SpO2 96%   BMI 24.87 kg/m   General Appearance: No distress  Neuro:without focal findings,  speech normal,  HEENT: PERRLA, EOM intact.   Pulmonary: normal breath sounds, No wheezing.  CardiovascularNormal S1,S2.  No m/r/g.   Abdomen: Benign, Soft, non-tender. Renal:  No costovertebral tenderness  GU:  No performed at this time. Endoc: No evident thyromegaly, no signs of acromegaly. Skin:   warm, no rashes, no ecchymosis  Extremities: normal, no cyanosis, clubbing.  Other findings:    LABORATORY PANEL:   CBC No results for input(s): WBC, HGB, HCT, PLT in the last 168 hours. ------------------------------------------------------------------------------------------------------------------  Chemistries  No results for input(s): NA, K, CL, CO2, GLUCOSE, BUN, CREATININE, CALCIUM, MG, AST, ALT, ALKPHOS, BILITOT in the last 168 hours.  Invalid input(s): GFRCGP ------------------------------------------------------------------------------------------------------------------  Cardiac Enzymes No results for input(s): TROPONINI in the last 168 hours. ------------------------------------------------------------  RADIOLOGY:  No results found.     Thank  you for the consultation and for allowing [facility] to assist in the care of your patient. Our recommendations are noted above.  Please contact us if we can be of further  service.   Marda Stalker, M.D., F.C.C.P.  Board Certified in Internal Medicine, Pulmonary Medicine, [place], and Sleep Medicine.  [facility] Office Number: [phone]   12/01/2018

## 2018-12-07 ENCOUNTER — Encounter: Payer: Self-pay | Admitting: Internal Medicine

## 2018-12-07 ENCOUNTER — Ambulatory Visit (INDEPENDENT_AMBULATORY_CARE_PROVIDER_SITE_OTHER): Payer: Medicare Other | Admitting: Internal Medicine

## 2018-12-07 VITALS — BP 143/78 | HR 77 | Ht 62.5 in | Wt 137.2 lb

## 2018-12-07 DIAGNOSIS — K219 Gastro-esophageal reflux disease without esophagitis: Secondary | ICD-10-CM

## 2018-12-07 DIAGNOSIS — R0789 Other chest pain: Secondary | ICD-10-CM

## 2018-12-07 NOTE — Patient Instructions (Signed)
Medication Instructions:  Continue current medications If you need a refill on your cardiac medications before your next appointment, please call your pharmacy.   Testing/Procedures: Your physician has requested that you have an exercise tolerance test.    Follow-Up: At [facility], you and your health needs are our priority.  As part of our continuing mission to provide you with exceptional heart care, we have created designated Provider Care Teams.  These Care Teams include your primary Cardiologist (physician) and Advanced Practice Providers (APPs -  Physician Assistants and Nurse Practitioners) who all work together to provide you with the care you need, when you need it. You will need a follow up appointment in as needed with Dr. Debara Pickett. Should you need an appointment, you may see Dr. Debara Pickett or one of the following Advanced Practice Providers on your designated Care Team: Almyra Deforest, [place] . Fabian Sharp, PA-C  Any Other Special Instructions Will Be Listed Below (If Applicable).

## 2018-12-08 ENCOUNTER — Telehealth (HOSPITAL_COMMUNITY): Payer: Self-pay

## 2018-12-08 ENCOUNTER — Encounter: Payer: Self-pay | Admitting: Internal Medicine

## 2018-12-08 MED ORDER — FAMOTIDINE 20 MG PO TABS: 20.0000 mg | ORAL_TABLET | Freq: Two times a day (BID) | ORAL | 1 refills | Status: DC

## 2018-12-08 NOTE — Telephone Encounter (Signed)
Encounter complete. 

## 2018-12-08 NOTE — Progress Notes (Signed)
OFFICE CONSULT NOTE  Chief Complaint:  Chest pain  Primary Care Physician: Pleas Koch, NP  HPI:  Kayla Gallegos is a 68 y.o. female who is being seen today for the evaluation of chest pain at the request of Ramachandran, Pradeep, *.  This is a pleasant 68 year old female was kindly referred for evaluation of chest pain.  She was recently seen in pulmonary for concerns of shortness of breath and chest discomfort.  She described an episode of chest discomfort which went across the anterior and posterior chest between her shoulder blades and radiated up to the neck.  There was some associated shortness of breath.  Prior to this she had had some discomfort in her left leg.  Recently she had lower back surgery.  She said that she thought she had a cool leg on the left side which then improved.  She has few cardiovascular risk factors.  There is a history of dyslipidemia however not on treatment.  Currently she is asymptomatic.  She is under a lot of stress per her report.  PMHx:  Past Medical History:  Diagnosis Date  . Hyperlipidemia   . Kidney stones   . Recurrent UTI (urinary tract infection)     Past Surgical History:  Procedure Laterality Date  . ABDOMINAL HYSTERECTOMY  1976  . COLONOSCOPY    . LAPAROSCOPIC CHOLECYSTECTOMY  1990  . TONSILLECTOMY      FAMHx:  Family History  Problem Relation Age of Onset  . Colon cancer Sister 9  . Colon cancer Brother 28  . Stomach cancer Neg Hx     SOCHx:   reports that she has never smoked. She has never used smokeless tobacco. She reports that she does not drink alcohol or use drugs.  ALLERGIES:  Allergies  Allergen Reactions  . Codeine Nausea And Vomiting and Nausea Only    dizziness    ROS: Pertinent items noted in HPI and remainder of comprehensive ROS otherwise negative.  HOME MEDS: Current Outpatient Medications on File Prior to Visit  Medication Sig Dispense Refill  . ascorbic acid (VITAMIN C) 500 MG tablet  Take by mouth daily.    . B Complex Vitamins (VITAMIN-B COMPLEX PO) Take by mouth daily.    Marland Kitchen estradiol (ESTRACE) 0.1 MG/GM vaginal cream Apply 1 gram per vagina every night for 2 weeks, then apply three times a week 30 g 12  . FOLIC ACID PO Take by mouth daily.    Marland Kitchen lactulose (CEPHULAC) 10 g packet Take 1 packet (10 g total) by mouth 3 (three) times daily. 30 each 0  . MAGNESIUM CARBONATE PO Take 1 capsule by mouth daily.    . Probiotic Product (PROBIOTIC DAILY PO) Take by mouth daily.     Current Facility-Administered Medications on File Prior to Visit  Medication Dose Route Frequency Provider Last Rate Last Dose  . 0.9 %  sodium chloride infusion  500 mL Intravenous Once Nandigam, Kavitha V, MD        LABS/IMAGING: No results found for this or any previous visit (from the past 48 hour(s)). No results found.  LIPID PANEL:    Component Value Date/Time   CHOL 224 (H) 01/21/2018 1226   TRIG 98.0 01/21/2018 1226   HDL 49.60 01/21/2018 1226   CHOLHDL 5 01/21/2018 1226   VLDL 19.6 01/21/2018 1226   LDLCALC 155 (H) 01/21/2018 1226   LDLDIRECT 152.3 05/29/2008 1548    WEIGHTS: Wt Readings from Last 3 Encounters:  12/07/18 137 lb  3.2 oz (62.2 kg)  12/01/18 136 lb (61.7 kg)  10/17/18 135 lb (61.2 kg)    VITALS: BP (!) 143/78   Pulse 77   Ht 5' 2.5" (1.588 m)   Wt 137 lb 3.2 oz (62.2 kg)   BMI 24.69 kg/m   EXAM: General appearance: alert and no distress Neck: no carotid bruit, no JVD and thyroid not enlarged, symmetric, no tenderness/mass/nodules Lungs: clear to auscultation bilaterally Heart: regular rate and rhythm, S1, S2 normal, no murmur, click, rub or gallop Abdomen: soft, non-tender; bowel sounds normal; no masses,  no organomegaly Extremities: extremities normal, atraumatic, no cyanosis or edema Pulses: 2+ and symmetric Skin: Skin color, texture, turgor normal. No rashes or lesions Neurologic: Grossly normal Psych: Pleasant  EKG: Sinus rhythm at 77- personally  reviewed  ASSESSMENT: 1. Atypical chest pain 2. Possible GERD  PLAN: 1.   Mrs. Boehringer is describing what sounds like atypical chest pain.  Her EKG is normal.  She has few cardiovascular risk factors.  Although there was some shortness of breath associated with her symptoms.  She is interested in starting more exercise and I would recommend treadmill exercise stress test.  Also she was encouraged to start on famotidine but not given prescription for that.  Although this is available over-the-counter may be cheaper for her to get a by prescription.  Will provide that to see if this improves some of her symptoms as well.  Follow-up with me only if her stress test is abnormal and will contact her with those results.  Thanks for the kind referral.  Pixie Casino, MD, FACC, [place] Director of the [facility]  [facility] Diplomate of the American Board of Clinical Lipidology Attending Cardiologist  Direct Dial: [phone]  Fax: [phone]  Website:  [email or web address] 12/08/2018, 8:36 AM

## 2018-12-09 ENCOUNTER — Ambulatory Visit (HOSPITAL_COMMUNITY)
Admission: RE | Admit: 2018-12-09 | Discharge: 2018-12-09 | Disposition: A | Discharge status: Home / Self Care | Payer: Medicare Other | Source: Ambulatory Visit | Attending: Cardiovascular Disease | Admitting: Cardiovascular Disease

## 2018-12-09 DIAGNOSIS — R0789 Other chest pain: Secondary | ICD-10-CM | POA: Diagnosis not present

## 2018-12-09 LAB — EXERCISE TOLERANCE TEST
CHL CUP RESTING HR STRESS: 72 {beats}/min
CHL RATE OF PERCEIVED EXERTION: 17
CSEPED: 9 min
CSEPEW: 11.3 METS
CSEPPHR: 162 {beats}/min
Exercise duration (sec): 47 s
MPHR: 152 {beats}/min
Percent HR: 106 %

## 2019-02-15 ENCOUNTER — Encounter: Payer: Self-pay | Admitting: Primary Care

## 2019-02-15 ENCOUNTER — Ambulatory Visit (INDEPENDENT_AMBULATORY_CARE_PROVIDER_SITE_OTHER): Payer: Medicare Other | Admitting: Primary Care

## 2019-02-15 ENCOUNTER — Other Ambulatory Visit: Payer: Self-pay

## 2019-02-15 VITALS — BP 135/65

## 2019-02-15 DIAGNOSIS — G4700 Insomnia, unspecified: Secondary | ICD-10-CM | POA: Diagnosis not present

## 2019-02-15 MED ORDER — HYDROXYZINE HCL 10 MG PO TABS: 10.0000 mg | ORAL_TABLET | Freq: Every evening | ORAL | 0 refills | Status: DC | PRN

## 2019-02-15 NOTE — Patient Instructions (Addendum)
Start hydroxyzine 10 mg tablets and take 1-2 tablets at bedtime as needed for sleep.   Please update me in one week.  It was a pleasure to see you today!

## 2019-02-15 NOTE — Progress Notes (Signed)
Subjective:    Patient ID: Janora Norlander Weisbecker, female    DOB: [birth year], 68 y.o.   MRN: [id number]  HPI  Virtual Visit via Video Note  I connected with Kamarah J Masley on 02/15/19 at  3:00 PM EDT by a video enabled telemedicine application and verified that I am speaking with the correct person using two identifiers.   I discussed the limitations of evaluation and management by telemedicine and the availability of in person appointments. The patient expressed understanding and agreed to proceed. She is at home, I am in the office.  History of Present Illness:  Ms. Delair is a 68 year old female with a history of insomnia who presents today via video with a chief complaint of difficulty sleeping.  She's never been treated for insomnia in the past. She has noticed an increase in anxiety with limitations from Covid-19. She is separated from her family who is located in different countries. She is going to bed around 9 pm, will toss and turn as her mind will race with worry, will get up and read, will fall asleep around 3 am and will wake by dawn. She's also noticed that she will itch all over when having difficulty with sleeping. Symptoms began about three weeks ago.   She's tried a few homeopathic sleep aids, Melatonin, lavender spray, bendaryl. Melatonin caused a headache the following day, does help some. Benadryl wasn't very effective for long.   Observations/Objective:  Appears to be without distress. Speaking in complete sentences. No cough or cold symptoms.  Assessment and Plan:  Symptoms of anxiety from Covid-19 that are keeping her up at night. Doesn't typically have anxiety. Mostly wants a good nights sleep. Given that she's failed numerous treatments OTC we will trial low dose hydroxyzine for anxiety and sleep HS. Start with 10 mg and can increase up to 20 mg if needed, discussed this with patient. She will update.    Follow Up Instructions:  Start hydroxyzine 10 mg  tablets and take 1-2 tablets at bedtime as needed for sleep.   Please update me in one week.   I discussed the assessment and treatment plan with the patient. The patient was provided an opportunity to ask questions and all were answered. The patient agreed with the plan and demonstrated an understanding of the instructions.   The patient was advised to call back or seek an in-person evaluation if the symptoms worsen or if the condition fails to improve as anticipated.  Video failed, we finished our visit via phone through WebEx.   Pleas Koch, NP    Review of Systems  Respiratory: Negative for cough and shortness of breath.   Cardiovascular: Negative for chest pain.  Psychiatric/Behavioral: Positive for sleep disturbance. The patient is nervous/anxious.        Past Medical History:  Diagnosis Date  . Hyperlipidemia   . Kidney stones   . Recurrent UTI (urinary tract infection)      Social History   Socioeconomic History  . Marital status: Married    Spouse name: Not on file  . Number of children: Not on file  . Years of education: Not on file  . Highest education level: Not on file  Occupational History  . Not on file  Social Needs  . Financial resource strain: Not on file  . Food insecurity:    Worry: Not on file    Inability: Not on file  . Transportation needs:    Medical: Not on file  Non-medical: Not on file  Tobacco Use  . Smoking status: Never Smoker  . Smokeless tobacco: Never Used  Substance and Sexual Activity  . Alcohol use: No  . Drug use: No  . Sexual activity: Not on file  Lifestyle  . Physical activity:    Days per week: Not on file    Minutes per session: Not on file  . Stress: Not on file  Relationships  . Social connections:    Talks on phone: Not on file    Gets together: Not on file    Attends religious service: Not on file    Active member of club or organization: Not on file    Attends meetings of clubs or organizations:  Not on file    Relationship status: Not on file  . Intimate partner violence:    Fear of current or ex partner: Not on file    Emotionally abused: Not on file    Physically abused: Not on file    Forced sexual activity: Not on file  Other Topics Concern  . Not on file  Social History Narrative  . Not on file    Past Surgical History:  Procedure Laterality Date  . ABDOMINAL HYSTERECTOMY  1976  . COLONOSCOPY    . LAPAROSCOPIC CHOLECYSTECTOMY  1990  . TONSILLECTOMY      Family History  Problem Relation Age of Onset  . Colon cancer Sister 6  . Colon cancer Brother 76  . Stomach cancer Neg Hx     Allergies  Allergen Reactions  . Codeine Nausea And Vomiting and Nausea Only    dizziness    Current Outpatient Medications on File Prior to Visit  Medication Sig Dispense Refill  . ascorbic acid (VITAMIN C) 500 MG tablet Take by mouth daily.    . B Complex Vitamins (VITAMIN-B COMPLEX PO) Take by mouth daily.    Marland Kitchen estradiol (ESTRACE) 0.1 MG/GM vaginal cream Apply 1 gram per vagina every night for 2 weeks, then apply three times a week 30 g 12  . famotidine (PEPCID) 20 MG tablet Take 1 tablet (20 mg total) by mouth 2 (two) times daily. 60 tablet 1  . FOLIC ACID PO Take by mouth daily.    Marland Kitchen lactulose (CEPHULAC) 10 g packet Take 1 packet (10 g total) by mouth 3 (three) times daily. 30 each 0  . MAGNESIUM CARBONATE PO Take 1 capsule by mouth daily.    . Probiotic Product (PROBIOTIC DAILY PO) Take by mouth daily.     Current Facility-Administered Medications on File Prior to Visit  Medication Dose Route Frequency Provider Last Rate Last Dose  . 0.9 %  sodium chloride infusion  500 mL Intravenous Once Nandigam, Kavitha V, MD        BP 135/65    Objective:   Physical Exam  Constitutional: She is oriented to person, place, and time.  Respiratory: Effort normal.  Neurological: She is alert and oriented to person, place, and time.  Psychiatric: She has a normal mood and affect.            Assessment & Plan:

## 2019-02-15 NOTE — Assessment & Plan Note (Signed)
Symptoms of anxiety from Covid-19 that are keeping her up at night. Doesn't typically have anxiety. Mostly wants a good nights sleep. Given that she's failed numerous treatments OTC we will trial low dose hydroxyzine for anxiety and sleep HS. Start with 10 mg and can increase up to 20 mg if needed, discussed this with patient. She will update.

## 2019-02-27 ENCOUNTER — Other Ambulatory Visit: Payer: Self-pay | Admitting: Primary Care

## 2019-02-27 DIAGNOSIS — G4700 Insomnia, unspecified: Secondary | ICD-10-CM

## 2019-05-11 DIAGNOSIS — H5203 Hypermetropia, bilateral: Secondary | ICD-10-CM | POA: Diagnosis not present

## 2019-05-11 DIAGNOSIS — H52223 Regular astigmatism, bilateral: Secondary | ICD-10-CM | POA: Diagnosis not present

## 2019-05-11 DIAGNOSIS — H524 Presbyopia: Secondary | ICD-10-CM | POA: Diagnosis not present

## 2019-05-11 DIAGNOSIS — H2513 Age-related nuclear cataract, bilateral: Secondary | ICD-10-CM | POA: Diagnosis not present

## 2019-06-05 DIAGNOSIS — H2511 Age-related nuclear cataract, right eye: Secondary | ICD-10-CM | POA: Diagnosis not present

## 2019-06-05 DIAGNOSIS — H25043 Posterior subcapsular polar age-related cataract, bilateral: Secondary | ICD-10-CM | POA: Diagnosis not present

## 2019-06-05 DIAGNOSIS — H25013 Cortical age-related cataract, bilateral: Secondary | ICD-10-CM | POA: Diagnosis not present

## 2019-06-05 DIAGNOSIS — H2513 Age-related nuclear cataract, bilateral: Secondary | ICD-10-CM | POA: Diagnosis not present

## 2019-06-19 ENCOUNTER — Other Ambulatory Visit: Payer: Self-pay

## 2019-06-26 DIAGNOSIS — M533 Sacrococcygeal disorders, not elsewhere classified: Secondary | ICD-10-CM | POA: Diagnosis not present

## 2019-06-27 DIAGNOSIS — H2511 Age-related nuclear cataract, right eye: Secondary | ICD-10-CM | POA: Diagnosis not present

## 2019-06-27 DIAGNOSIS — H25811 Combined forms of age-related cataract, right eye: Secondary | ICD-10-CM | POA: Diagnosis not present

## 2019-06-28 DIAGNOSIS — H25042 Posterior subcapsular polar age-related cataract, left eye: Secondary | ICD-10-CM | POA: Diagnosis not present

## 2019-06-28 DIAGNOSIS — H25012 Cortical age-related cataract, left eye: Secondary | ICD-10-CM | POA: Diagnosis not present

## 2019-07-04 DIAGNOSIS — Z961 Presence of intraocular lens: Secondary | ICD-10-CM | POA: Diagnosis not present

## 2019-07-04 DIAGNOSIS — H2512 Age-related nuclear cataract, left eye: Secondary | ICD-10-CM | POA: Diagnosis not present

## 2019-07-04 DIAGNOSIS — H25812 Combined forms of age-related cataract, left eye: Secondary | ICD-10-CM | POA: Diagnosis not present

## 2019-07-04 DIAGNOSIS — H2511 Age-related nuclear cataract, right eye: Secondary | ICD-10-CM | POA: Diagnosis not present

## 2019-10-11 ENCOUNTER — Other Ambulatory Visit: Payer: Self-pay

## 2019-11-16 ENCOUNTER — Telehealth: Payer: Self-pay

## 2019-11-16 DIAGNOSIS — G4700 Insomnia, unspecified: Secondary | ICD-10-CM

## 2019-11-16 MED ORDER — HYDROXYZINE HCL 10 MG PO TABS: 10.0000 mg | ORAL_TABLET | Freq: Every evening | ORAL | 0 refills | Status: DC | PRN

## 2019-11-16 NOTE — Telephone Encounter (Signed)
Patient contacted the office and stated that she is just feeling very overwhelmed and stressed due to the [place] pandemic, and is having a very hard time sleeping. She states that Anda Kraft has prescribed hydroxyzine in the past, and this has helped a little bit, but she is still having issues with trying to calm herself down and relax at night time to fall asleep. I did schedule an appt with Anda Kraft to discuss this virtually on 11/19/18 - but patient is wondering if Anda Kraft can send her something in today just to get her through until her appt on Monday, so she can get some rest.  Please advise.

## 2019-11-16 NOTE — Telephone Encounter (Signed)
Noted, will send refill of hydroxyzine to help with symptoms until we can meet virtually.

## 2019-11-20 ENCOUNTER — Encounter: Payer: Self-pay | Admitting: Primary Care

## 2019-11-20 ENCOUNTER — Other Ambulatory Visit: Payer: Self-pay

## 2019-11-20 ENCOUNTER — Ambulatory Visit (INDEPENDENT_AMBULATORY_CARE_PROVIDER_SITE_OTHER): Payer: Medicare Other | Admitting: Primary Care

## 2019-11-20 VITALS — BP 126/65 | HR 83

## 2019-11-20 DIAGNOSIS — Z23 Encounter for immunization: Secondary | ICD-10-CM | POA: Diagnosis not present

## 2019-11-20 DIAGNOSIS — G4700 Insomnia, unspecified: Secondary | ICD-10-CM | POA: Diagnosis not present

## 2019-11-20 DIAGNOSIS — F419 Anxiety disorder, unspecified: Secondary | ICD-10-CM | POA: Diagnosis not present

## 2019-11-20 MED ORDER — ZOSTER VAC RECOMB ADJUVANTED 50 MCG/0.5ML IM SUSR: 0.5000 mL | Freq: Once | INTRAMUSCULAR | 1 refills | Status: AC

## 2019-11-20 NOTE — Assessment & Plan Note (Signed)
Sleep disturbance likely secondary to anxiety towards Covid-19 and the recent loss of her close friend.  Discussed to try the full hydroxyzine 10 mg tablet and that it was a low dose overall. We also discussed her to notify me if her daily anxiety symptoms persisted as we may need to consider treatment for anxiety.  She agreed and will update.

## 2019-11-20 NOTE — Patient Instructions (Signed)
Take the full tablet of hydroxyzine 10 mg tonight at bedtime for sleep.  Please update me if your symptoms persist despite medication.  It was a pleasure to see you today! [name], NP-

## 2019-11-20 NOTE — Progress Notes (Signed)
Subjective:    Patient ID: Kayla Gallegos, female    DOB: June 14, 1951, 69 y.o.   MRN: [id number]  HPI  Virtual Visit via Video Note  I connected with Roneisha J Truby on 11/20/19 at 11:20 AM EST by a video enabled telemedicine application and verified that I am speaking with the correct person using two identifiers.  Location: Patient: Musician Provider: Office   I discussed the limitations of evaluation and management by telemedicine and the availability of in person appointments. The patient expressed understanding and agreed to proceed.  History of Present Illness:  Ms. Taff is a 69 year old female with a history of anxiety and depression who presents today with a chief complaint of insomnia. She'd also like to get the Shingrix vaccine.  She was initially evaluated for insomnia/anxiety in April 2020, endorsed increased anxiety due to Covid-19 and separation from her family. During that visit she endorsed trying several OTC and natural products for insomnia without improvement. She was initiated on hydroxyzine 10 mg and was asked to update.  Since her last visit she's lost four friends to [place], including her best friend for who passed away three weeks ago. Over the last 2-3 months her symptoms of sleep disturbance have returned. She is waking up during the night and is having trouble falling back asleep. According to her Fit Bit she's averaging 2-3 hours of sleep nightly.  She is feeling overwhelmed and anxious given Covid-19, works hard to prevent exposure by limiting contact with others in public spaces. Also has limited contact with family. Last night she took half of the hydroxyzine 10 mg tablet, slept a few hours then awoke. She then took the second half of the tablet and slept for the remaninig of the night.   Observations/Objective:  Alert and oriented. Appears well, not sickly. No distress. Speaking in complete sentences.   Assessment and Plan:  Sleep disturbance likely  secondary to anxiety towards Covid-19 and the recent loss of her close friend.  Discussed to try the full hydroxyzine 10 mg tablet and that it was a low dose overall. We also discussed her to notify me if her daily anxiety symptoms persisted as we may need to consider treatment for anxiety.  She agreed and will update.  Follow Up Instructions:  Take the full tablet of hydroxyzine 10 mg tonight at bedtime for sleep.  Please update me if your symptoms persist despite medication.  It was a pleasure to see you today! [name], NP-C    I discussed the assessment and treatment plan with the patient. The patient was provided an opportunity to ask questions and all were answered. The patient agreed with the plan and demonstrated an understanding of the instructions.   The patient was advised to call back or seek an in-person evaluation if the symptoms worsen or if the condition fails to improve as anticipated.    Pleas Koch, NP    Review of Systems  Psychiatric/Behavioral: Positive for sleep disturbance. The patient is nervous/anxious.        See HPI       Past Medical History:  Diagnosis Date  . Hyperlipidemia   . Kidney stones   . Recurrent UTI (urinary tract infection)      Social History   Socioeconomic History  . Marital status: Married    Spouse name: Not on file  . Number of children: Not on file  . Years of education: Not on file  . Highest education level:  Not on file  Occupational History  . Not on file  Tobacco Use  . Smoking status: Never Smoker  . Smokeless tobacco: Never Used  Substance and Sexual Activity  . Alcohol use: No  . Drug use: No  . Sexual activity: Not on file  Other Topics Concern  . Not on file  Social History Narrative  . Not on file   Social Determinants of Health   Financial Resource Strain:   . Difficulty of Paying Living Expenses: Not on file  Food Insecurity:   . Worried About Charity fundraiser in the Last Year: Not  on file  . Ran Out of Food in the Last Year: Not on file  Transportation Needs:   . Lack of Transportation (Medical): Not on file  . Lack of Transportation (Non-Medical): Not on file  Physical Activity:   . Days of Exercise per Week: Not on file  . Minutes of Exercise per Session: Not on file  Stress:   . Feeling of Stress : Not on file  Social Connections:   . Frequency of Communication with Friends and Family: Not on file  . Frequency of Social Gatherings with Friends and Family: Not on file  . Attends Religious Services: Not on file  . Active Member of Clubs or Organizations: Not on file  . Attends Archivist Meetings: Not on file  . Marital Status: Not on file  Intimate Partner Violence:   . Fear of Current or Ex-Partner: Not on file  . Emotionally Abused: Not on file  . Physically Abused: Not on file  . Sexually Abused: Not on file    Past Surgical History:  Procedure Laterality Date  . ABDOMINAL HYSTERECTOMY  1976  . COLONOSCOPY    . LAPAROSCOPIC CHOLECYSTECTOMY  1990  . TONSILLECTOMY      Family History  Problem Relation Age of Onset  . Colon cancer Sister 68  . Colon cancer Brother 71  . Stomach cancer Neg Hx     Allergies  Allergen Reactions  . Codeine Nausea And Vomiting and Nausea Only    dizziness    Current Outpatient Medications on File Prior to Visit  Medication Sig Dispense Refill  . ascorbic acid (VITAMIN C) 500 MG tablet Take by mouth daily.    . B Complex Vitamins (VITAMIN-B COMPLEX PO) Take by mouth daily.    Marland Kitchen estradiol (ESTRACE) 0.1 MG/GM vaginal cream Apply 1 gram per vagina every night for 2 weeks, then apply three times a week 30 g 12  . famotidine (PEPCID) 20 MG tablet Take 1 tablet (20 mg total) by mouth 2 (two) times daily. 60 tablet 1  . FOLIC ACID PO Take by mouth daily.    . hydrOXYzine (ATARAX/VISTARIL) 10 MG tablet Take 1-2 tablets (10-20 mg total) by mouth at bedtime as needed for anxiety (sleep). 30 tablet 0  .  lactulose (CEPHULAC) 10 g packet Take 1 packet (10 g total) by mouth 3 (three) times daily. 30 each 0  . MAGNESIUM CARBONATE PO Take 1 capsule by mouth daily.    . Probiotic Product (PROBIOTIC DAILY PO) Take by mouth daily.     Current Facility-Administered Medications on File Prior to Visit  Medication Dose Route Frequency Provider Last Rate Last Admin  . 0.9 %  sodium chloride infusion  500 mL Intravenous Once Nandigam, Kavitha V, MD        BP 126/65   Pulse 83    Objective:   Physical Exam  Constitutional: She is oriented to person, place, and time. She appears well-nourished.  Respiratory: Effort normal.  Neurological: She is alert and oriented to person, place, and time.  Psychiatric: She has a normal mood and affect.           Assessment & Plan:

## 2019-11-27 ENCOUNTER — Ambulatory Visit (INDEPENDENT_AMBULATORY_CARE_PROVIDER_SITE_OTHER): Payer: Medicare Other | Admitting: Primary Care

## 2019-11-27 ENCOUNTER — Encounter: Payer: Self-pay | Admitting: Primary Care

## 2019-11-27 ENCOUNTER — Other Ambulatory Visit: Payer: Self-pay

## 2019-11-27 VITALS — BP 140/90 | HR 85 | Temp 97.6°F | Ht 62.5 in | Wt 142.5 lb

## 2019-11-27 DIAGNOSIS — R102 Pelvic and perineal pain: Secondary | ICD-10-CM | POA: Diagnosis not present

## 2019-11-27 LAB — POC URINALSYSI DIPSTICK (AUTOMATED)
Bilirubin, UA: NEGATIVE
Blood, UA: NEGATIVE
Glucose, UA: NEGATIVE
Ketones, UA: NEGATIVE
Leukocytes, UA: NEGATIVE
Nitrite, UA: NEGATIVE
Protein, UA: NEGATIVE
Spec Grav, UA: 1.01 (ref 1.010–1.025)
Urobilinogen, UA: 0.2 E.U./dL
pH, UA: 7.5 (ref 5.0–8.0)

## 2019-11-27 NOTE — Progress Notes (Signed)
Subjective:    Patient ID: Kayla Gallegos, female    DOB: [birth year], 69 y.o.   MRN: [id number]  HPI  This visit occurred during the SARS-CoV-2 public health emergency.  Safety protocols were in place, including screening questions prior to the visit, additional usage of staff PPE, and extensive cleaning of exam room while observing appropriate contact time as indicated for disinfecting solutions.   Kayla Gallegos is a 69 year old female with a history of diverticulosis, renal stones, GERD, recurrent UTI, vaginal dryness who presents today with a chief complaint of suprapubic pressure.  Acute for the last 4-5 nights, located to the suprapubic region. Symptoms only occur at night. She denies vaginal symptoms, dysuria, urinary frequency during the day, radiation of pain, flank pain, lower back pain, hematuria. She has a long history of nocturia that hasn't changed.    She was moving some heavy flower pots several days prior, she's not sure if this caused her symptoms. History of lumbar laminectomy in 2019.   BP Readings from Last 3 Encounters:  11/27/19 140/90  11/20/19 126/65  02/15/19 135/65      Review of Systems  Genitourinary: Negative for dysuria, frequency, hematuria and vaginal discharge.       Suprapubic pressure       Past Medical History:  Diagnosis Date  . Hyperlipidemia   . Kidney stones   . Recurrent UTI (urinary tract infection)      Social History   Socioeconomic History  . Marital status: Married    Spouse name: Not on file  . Number of children: Not on file  . Years of education: Not on file  . Highest education level: Not on file  Occupational History  . Not on file  Tobacco Use  . Smoking status: Never Smoker  . Smokeless tobacco: Never Used  Substance and Sexual Activity  . Alcohol use: No  . Drug use: No  . Sexual activity: Not on file  Other Topics Concern  . Not on file  Social History Narrative  . Not on file   Social Determinants of  Health   Financial Resource Strain:   . Difficulty of Paying Living Expenses: Not on file  Food Insecurity:   . Worried About Charity fundraiser in the Last Year: Not on file  . Ran Out of Food in the Last Year: Not on file  Transportation Needs:   . Lack of Transportation (Medical): Not on file  . Lack of Transportation (Non-Medical): Not on file  Physical Activity:   . Days of Exercise per Week: Not on file  . Minutes of Exercise per Session: Not on file  Stress:   . Feeling of Stress : Not on file  Social Connections:   . Frequency of Communication with Friends and Family: Not on file  . Frequency of Social Gatherings with Friends and Family: Not on file  . Attends Religious Services: Not on file  . Active Member of Clubs or Organizations: Not on file  . Attends Archivist Meetings: Not on file  . Marital Status: Not on file  Intimate Partner Violence:   . Fear of Current or Ex-Partner: Not on file  . Emotionally Abused: Not on file  . Physically Abused: Not on file  . Sexually Abused: Not on file    Past Surgical History:  Procedure Laterality Date  . ABDOMINAL HYSTERECTOMY  1976  . COLONOSCOPY    . LAPAROSCOPIC CHOLECYSTECTOMY  1990  . TONSILLECTOMY  Family History  Problem Relation Age of Onset  . Colon cancer Sister 57  . Colon cancer Brother 75  . Stomach cancer Neg Hx     Allergies  Allergen Reactions  . Codeine Nausea And Vomiting and Nausea Only    dizziness    Current Outpatient Medications on File Prior to Visit  Medication Sig Dispense Refill  . ascorbic acid (VITAMIN C) 500 MG tablet Take by mouth daily.    . B Complex Vitamins (VITAMIN-B COMPLEX PO) Take by mouth daily.    Marland Kitchen estradiol (ESTRACE) 0.1 MG/GM vaginal cream Apply 1 gram per vagina every night for 2 weeks, then apply three times a week 30 g 12  . famotidine (PEPCID) 20 MG tablet Take 1 tablet (20 mg total) by mouth 2 (two) times daily. 60 tablet 1  . FOLIC ACID PO Take  by mouth daily.    . hydrOXYzine (ATARAX/VISTARIL) 10 MG tablet Take 1-2 tablets (10-20 mg total) by mouth at bedtime as needed for anxiety (sleep). 30 tablet 0  . lactulose (CEPHULAC) 10 g packet Take 1 packet (10 g total) by mouth 3 (three) times daily. 30 each 0  . MAGNESIUM CARBONATE PO Take 1 capsule by mouth daily.    . Probiotic Product (PROBIOTIC DAILY PO) Take by mouth daily.     Current Facility-Administered Medications on File Prior to Visit  Medication Dose Route Frequency Provider Last Rate Last Admin  . 0.9 %  sodium chloride infusion  500 mL Intravenous Once Nandigam, Kavitha V, MD        BP 140/90   Pulse 85   Temp 97.6 F (36.4 C) (Temporal)   Ht 5' 2.5" (1.588 m)   Wt 142 lb 8 oz (64.6 kg)   SpO2 98%   BMI 25.65 kg/m    Objective:   Physical Exam  Constitutional: She appears well-nourished.  Cardiovascular: Normal rate.  Respiratory: Effort normal.  GI: Soft. Normal appearance and bowel sounds are normal. There is no CVA tenderness.    Musculoskeletal:     Cervical back: Neck supple.  Skin: Skin is warm and dry.           Assessment & Plan:  Suprapubic Pressure:  Acute for the last 4-5 nights, not occurring during the day. Exam today stable. UA today negative. Culture sent. She's not having vaginal symptoms or symptoms suggestive of renal stones, acute diverticulitis.  She will update if symptoms do not improve with NSAID's. Return precautions provided.  Pleas Koch, NP

## 2019-11-27 NOTE — Patient Instructions (Signed)
We'll be in touch with your urine culture results on Wednesday this week.  It was a pleasure to see you today!

## 2019-11-28 LAB — URINE CULTURE
MICRO NUMBER:: 10027906
Result:: NO GROWTH
SPECIMEN QUALITY:: ADEQUATE

## 2019-12-25 ENCOUNTER — Telehealth: Payer: Self-pay | Admitting: Primary Care

## 2019-12-25 NOTE — Telephone Encounter (Signed)
Can we meet briefly to discuss? Virtual visit is fine.

## 2019-12-25 NOTE — Telephone Encounter (Signed)
Patient stated that she is currently seeing Dr Wilburn Cornelia( ENT )  Patient stated that she has been having trouble with ear and the pain gets so bad that it goes down her neck. Patient said after 3 trips she does not feel like he is helping with her ear. She would like to know if there is somewhere else that you recommend and could refer her to.  Please advise

## 2019-12-27 DIAGNOSIS — M26622 Arthralgia of left temporomandibular joint: Secondary | ICD-10-CM | POA: Diagnosis not present

## 2019-12-27 DIAGNOSIS — H9202 Otalgia, left ear: Secondary | ICD-10-CM | POA: Diagnosis not present

## 2019-12-27 DIAGNOSIS — H6122 Impacted cerumen, left ear: Secondary | ICD-10-CM | POA: Diagnosis not present

## 2019-12-27 DIAGNOSIS — J310 Chronic rhinitis: Secondary | ICD-10-CM | POA: Diagnosis not present

## 2019-12-27 DIAGNOSIS — H6993 Unspecified Eustachian tube disorder, bilateral: Secondary | ICD-10-CM | POA: Insufficient documentation

## 2019-12-27 DIAGNOSIS — H6983 Other specified disorders of Eustachian tube, bilateral: Secondary | ICD-10-CM | POA: Insufficient documentation

## 2019-12-27 NOTE — Telephone Encounter (Signed)
Called pt to get her scheduled. She said she got an appointment at an old ENT she used to go to.

## 2020-02-26 DIAGNOSIS — G4700 Insomnia, unspecified: Secondary | ICD-10-CM

## 2020-02-27 MED ORDER — HYDROXYZINE HCL 10 MG PO TABS: 10.0000 mg | ORAL_TABLET | Freq: Every evening | ORAL | 0 refills | Status: DC | PRN

## 2020-02-27 NOTE — Telephone Encounter (Signed)
Last prescribed on 11/16/2019 . Last appointment on 11/27/2019. No future appointment

## 2020-03-11 ENCOUNTER — Ambulatory Visit (INDEPENDENT_AMBULATORY_CARE_PROVIDER_SITE_OTHER): Payer: Medicare Other

## 2020-03-11 ENCOUNTER — Other Ambulatory Visit: Payer: Self-pay

## 2020-03-11 DIAGNOSIS — Z0000 Encounter for general adult medical examination without abnormal findings: Secondary | ICD-10-CM

## 2020-03-11 NOTE — Patient Instructions (Signed)
Kayla Gallegos , Thank you for taking time to come for your Medicare Wellness Visit. I appreciate your ongoing commitment to your health goals. Please review the following plan we discussed and let me know if I can assist you in the future.   Screening recommendations/referrals: Colonoscopy: Up to date, completed 03/14/2018 Mammogram: declined Bone Density: declined Recommended yearly ophthalmology/optometry visit for glaucoma screening and checkup Recommended yearly dental visit for hygiene and checkup  Vaccinations: Influenza vaccine: Fall 2021  Pneumococcal vaccine: declined  Tdap vaccine: decline Shingles vaccine: discussed    Advanced directives: Advance directive discussed with you today. Even though you declined this today please call our office should you change your mind and we can give you the proper paperwork for you to fill out.  Conditions/risks identified: hyperlipidemia  Next appointment: none   Preventive Care 45 Years and Older, Female Preventive care refers to lifestyle choices and visits with your health care provider that can promote health and wellness. What does preventive care include?  A yearly physical exam. This is also called an annual well check.  Dental exams once or twice a year.  Routine eye exams. Ask your health care provider how often you should have your eyes checked.  Personal lifestyle choices, including:  Daily care of your teeth and gums.  Regular physical activity.  Eating a healthy diet.  Avoiding tobacco and drug use.  Limiting alcohol use.  Practicing safe sex.  Taking low-dose aspirin every day.  Taking vitamin and mineral supplements as recommended by your health care provider. What happens during an annual well check? The services and screenings done by your health care provider during your annual well check will depend on your age, overall health, lifestyle risk factors, and family history of disease. Counseling  Your  health care provider may ask you questions about your:  Alcohol use.  Tobacco use.  Drug use.  Emotional well-being.  Home and relationship well-being.  Sexual activity.  Eating habits.  History of falls.  Memory and ability to understand (cognition).  Work and work Statistician.  Reproductive health. Screening  You may have the following tests or measurements:  Height, weight, and BMI.  Blood pressure.  Lipid and cholesterol levels. These may be checked every 5 years, or more frequently if you are over 54 years old.  Skin check.  Lung cancer screening. You may have this screening every year starting at age 34 if you have a 30-pack-year history of smoking and currently smoke or have quit within the past 15 years.  Fecal occult blood test (FOBT) of the stool. You may have this test every year starting at age 31.  Flexible sigmoidoscopy or colonoscopy. You may have a sigmoidoscopy every 5 years or a colonoscopy every 10 years starting at age 37.  Hepatitis C blood test.  Hepatitis B blood test.  Sexually transmitted disease (STD) testing.  Diabetes screening. This is done by checking your blood sugar (glucose) after you have not eaten for a while (fasting). You may have this done every 1-3 years.  Bone density scan. This is done to screen for osteoporosis. You may have this done starting at age 62.  Mammogram. This may be done every 1-2 years. Talk to your health care provider about how often you should have regular mammograms. Talk with your health care provider about your test results, treatment options, and if necessary, the need for more tests. Vaccines  Your health care provider may recommend certain vaccines, such as:  Influenza vaccine.  This is recommended every year.  Tetanus, diphtheria, and acellular pertussis (Tdap, Td) vaccine. You may need a Td booster every 10 years.  Zoster vaccine. You may need this after age 58.  Pneumococcal 13-valent  conjugate (PCV13) vaccine. One dose is recommended after age 4.  Pneumococcal polysaccharide (PPSV23) vaccine. One dose is recommended after age 62. Talk to your health care provider about which screenings and vaccines you need and how often you need them. This information is not intended to replace advice given to you by your health care provider. Make sure you discuss any questions you have with your health care provider. Document Released: 11/29/2015 Document Revised: 07/22/2016 Document Reviewed: 09/03/2015 Elsevier Interactive Patient Education  2017 [place] Prevention in the Home Falls can cause injuries. They can happen to people of all ages. There are many things you can do to make your home safe and to help prevent falls. What can I do on the outside of my home?  Regularly fix the edges of walkways and driveways and fix any cracks.  Remove anything that might make you trip as you walk through a door, such as a raised step or threshold.  Trim any bushes or trees on the path to your home.  Use bright outdoor lighting.  Clear any walking paths of anything that might make someone trip, such as rocks or tools.  Regularly check to see if handrails are loose or broken. Make sure that both sides of any steps have handrails.  Any raised decks and porches should have guardrails on the edges.  Have any leaves, snow, or ice cleared regularly.  Use sand or salt on walking paths during winter.  Clean up any spills in your garage right away. This includes oil or grease spills. What can I do in the bathroom?  Use night lights.  Install grab bars by the toilet and in the tub and shower. Do not use towel bars as grab bars.  Use non-skid mats or decals in the tub or shower.  If you need to sit down in the shower, use a plastic, non-slip stool.  Keep the floor dry. Clean up any water that spills on the floor as soon as it happens.  Remove soap buildup in the tub or  shower regularly.  Attach bath mats securely with double-sided non-slip rug tape.  Do not have throw rugs and other things on the floor that can make you trip. What can I do in the bedroom?  Use night lights.  Make sure that you have a light by your bed that is easy to reach.  Do not use any sheets or blankets that are too big for your bed. They should not hang down onto the floor.  Have a firm chair that has side arms. You can use this for support while you get dressed.  Do not have throw rugs and other things on the floor that can make you trip. What can I do in the kitchen?  Clean up any spills right away.  Avoid walking on wet floors.  Keep items that you use a lot in easy-to-reach places.  If you need to reach something above you, use a strong step stool that has a grab bar.  Keep electrical cords out of the way.  Do not use floor polish or wax that makes floors slippery. If you must use wax, use non-skid floor wax.  Do not have throw rugs and other things on the floor that can make  you trip. What can I do with my stairs?  Do not leave any items on the stairs.  Make sure that there are handrails on both sides of the stairs and use them. Fix handrails that are broken or loose. Make sure that handrails are as long as the stairways.  Check any carpeting to make sure that it is firmly attached to the stairs. Fix any carpet that is loose or worn.  Avoid having throw rugs at the top or bottom of the stairs. If you do have throw rugs, attach them to the floor with carpet tape.  Make sure that you have a light switch at the top of the stairs and the bottom of the stairs. If you do not have them, ask someone to add them for you. What else can I do to help prevent falls?  Wear shoes that:  Do not have high heels.  Have rubber bottoms.  Are comfortable and fit you well.  Are closed at the toe. Do not wear sandals.  If you use a stepladder:  Make sure that it is fully  opened. Do not climb a closed stepladder.  Make sure that both sides of the stepladder are locked into place.  Ask someone to hold it for you, if possible.  Clearly mark and make sure that you can see:  Any grab bars or handrails.  First and last steps.  Where the edge of each step is.  Use tools that help you move around (mobility aids) if they are needed. These include:  Canes.  Walkers.  Scooters.  Crutches.  Turn on the lights when you go into a dark area. Replace any light bulbs as soon as they burn out.  Set up your furniture so you have a clear path. Avoid moving your furniture around.  If any of your floors are uneven, fix them.  If there are any pets around you, be aware of where they are.  Review your medicines with your doctor. Some medicines can make you feel dizzy. This can increase your chance of falling. Ask your doctor what other things that you can do to help prevent falls. This information is not intended to replace advice given to you by your health care provider. Make sure you discuss any questions you have with your health care provider. Document Released: 08/29/2009 Document Revised: 04/09/2016 Document Reviewed: 12/07/2014 Elsevier Interactive Patient Education  2017 [employer].

## 2020-03-11 NOTE — Progress Notes (Signed)
PCP notes:  Health Maintenance: Mammogram- declined Dexa- declined Prevnar 13- decline Flu- declined Shingrix- declined  Abnormal Screenings: none   Patient concerns: none   Nurse concerns: none   Next PCP appt.: non

## 2020-03-11 NOTE — Progress Notes (Signed)
Subjective:   Kayla Gallegos is a 69 y.o. female who presents for an Initial Medicare Annual Wellness Visit.  Review of Systems: N/A      This visit is being conducted through telemedicine via telephone at the nurse health advisor's home address due to the COVID-19 pandemic. This patient has given me verbal consent via doximity to conduct this visit, patient states they are participating from their home address. Patient and myself are on the telephone call. There is no referral for this visit. Some vital signs may be absent or patient reported.    Patient identification: identified by name, DOB, and current address    Cardiac Risk Factors include: advanced age (>31men, >2 women);dyslipidemia     Objective:    Today's Vitals   There is no height or weight on file to calculate BMI.  Advanced Directives 03/11/2020 07/27/2018  Does Patient Have a Medical Advance Directive? No No  Would patient like information on creating a medical advance directive? No - Patient declined -    Current Medications (verified) Outpatient Encounter Medications as of 03/11/2020  Medication Sig  . ascorbic acid (VITAMIN C) 500 MG tablet Take by mouth daily.  . B Complex Vitamins (VITAMIN-B COMPLEX PO) Take by mouth daily.  Marland Kitchen estradiol (ESTRACE) 0.1 MG/GM vaginal cream Apply 1 gram per vagina every night for 2 weeks, then apply three times a week  . famotidine (PEPCID) 20 MG tablet Take 1 tablet (20 mg total) by mouth 2 (two) times daily.  Marland Kitchen FOLIC ACID PO Take by mouth daily.  . hydrOXYzine (ATARAX/VISTARIL) 10 MG tablet Take 1-2 tablets (10-20 mg total) by mouth at bedtime as needed for anxiety (sleep).  . lactulose (CEPHULAC) 10 g packet Take 1 packet (10 g total) by mouth 3 (three) times daily.  Marland Kitchen MAGNESIUM CARBONATE PO Take 1 capsule by mouth daily.  . Probiotic Product (PROBIOTIC DAILY PO) Take by mouth daily.   Facility-Administered Encounter Medications as of 03/11/2020  Medication  . 0.9 %   sodium chloride infusion    Allergies (verified) Codeine   History: Past Medical History:  Diagnosis Date  . Hyperlipidemia   . Kidney stones   . Recurrent UTI (urinary tract infection)    Past Surgical History:  Procedure Laterality Date  . ABDOMINAL HYSTERECTOMY  1976  . COLONOSCOPY    . LAPAROSCOPIC CHOLECYSTECTOMY  1990  . TONSILLECTOMY     Family History  Problem Relation Age of Onset  . Colon cancer Sister 82  . Colon cancer Brother 57  . Stomach cancer Neg Hx    Social History   Socioeconomic History  . Marital status: Married    Spouse name: Not on file  . Number of children: Not on file  . Years of education: Not on file  . Highest education level: Not on file  Occupational History  . Not on file  Tobacco Use  . Smoking status: Never Smoker  . Smokeless tobacco: Never Used  Substance and Sexual Activity  . Alcohol use: No  . Drug use: No  . Sexual activity: Not on file  Other Topics Concern  . Not on file  Social History Narrative  . Not on file   Social Determinants of Health   Financial Resource Strain: Low Risk   . Difficulty of Paying Living Expenses: Not hard at all  Food Insecurity: No Food Insecurity  . Worried About Charity fundraiser in the Last Year: Never true  . Ran Out of  Food in the Last Year: Never true  Transportation Needs: No Transportation Needs  . Lack of Transportation (Medical): No  . Lack of Transportation (Non-Medical): No  Physical Activity: Inactive  . Days of Exercise per Week: 0 days  . Minutes of Exercise per Session: 0 min  Stress: Stress Concern Present  . Feeling of Stress : To some extent  Social Connections:   . Frequency of Communication with Friends and Family:   . Frequency of Social Gatherings with Friends and Family:   . Attends Religious Services:   . Active Member of Clubs or Organizations:   . Attends Archivist Meetings:   Marland Kitchen Marital Status:     Tobacco Counseling Counseling given:  Not Answered   Clinical Intake:  Pre-visit preparation completed: Yes  Pain : No/denies pain     Nutritional Risks: None Diabetes: No  How often do you need to have someone help you when you read instructions, pamphlets, or other written materials from your doctor or pharmacy?: 1 - Never What is the last grade level you completed in school?: 1 year of college  Interpreter Needed?: No  Information entered by :: CJohnson, LPN   Activities of Daily Living In your present state of health, do you have any difficulty performing the following activities: 03/11/2020  Hearing? N  Vision? N  Difficulty concentrating or making decisions? N  Walking or climbing stairs? N  Dressing or bathing? N  Doing errands, shopping? N  Preparing Food and eating ? N  Using the Toilet? N  In the past six months, have you accidently leaked urine? N  Do you have problems with loss of bowel control? N  Managing your Medications? N  Managing your Finances? N  Housekeeping or managing your Housekeeping? N  Some recent data might be hidden     Immunizations and Health Maintenance  There is no immunization history on file for this patient. There are no preventive care reminders to display for this patient.  Patient Care Team: Pleas Koch, NP as PCP - General (Internal Medicine)  Indicate any recent Medical Services you may have received from other than [facility] providers in the past year (date may be approximate).     Assessment:   This is a routine wellness examination for Kayla Gallegos.  Hearing/Vision screen  Hearing Screening   125Hz  250Hz  500Hz  1000Hz  2000Hz  3000Hz  4000Hz  6000Hz  8000Hz   Right ear:           Left ear:           Vision Screening Comments: Patient gets annual eye exams   Dietary issues and exercise activities discussed: Current Exercise Habits: The patient does not participate in regular exercise at present, Exercise limited by: None identified  Goals    . Patient  Stated     03/11/2020, I will continue to work in my garden everyday.      Depression Screen PHQ 2/9 Scores 03/11/2020  PHQ - 2 Score 4  PHQ- 9 Score 4    Fall Risk Fall Risk  03/11/2020 10/11/2019 06/19/2019 01/21/2018  Falls in the past year? 0 0 (No Data) No  Comment - Emmi Telephone Survey: data to providers prior to load Emmi Telephone Survey: data to providers prior to load -  Number falls in past yr: 0 - (No Data) -  Comment - - Emmi Telephone Survey Actual Response =  -  Injury with Fall? 0 - - -  Risk for fall due to : No Fall  Risks - - -  Follow up Falls evaluation completed;Falls prevention discussed - - -    Is the patient's home free of loose throw rugs in walkways, pet beds, electrical cords, etc?   yes      Grab bars in the bathroom? no      Handrails on the stairs?   yes      Adequate lighting?   yes  Timed Get Up and Go Performed: N/A  Cognitive Function: MMSE - Mini Mental State Exam 03/11/2020  Orientation to time 5  Orientation to Place 5  Registration 3  Attention/ Calculation 5  Recall 3  Language- repeat 1       Mini Cog  Mini-Cog screen was completed. Maximum score is 22. A value of 0 denotes this part of the MMSE was not completed or the patient failed this part of the Mini-Cog screening.  Screening Tests Health Maintenance  Topic Date Due  . COVID-19 Vaccine (1) 03/27/2021 (Originally 07/08/1967)  . MAMMOGRAM  03/11/2024 (Originally 12/29/2009)  . TETANUS/TDAP  03/11/2024 (Originally 07/08/1969)  . PNA vac Low Risk Adult (1 of 2 - PCV13) 03/11/2024 (Originally 07/09/2015)  . INFLUENZA VACCINE  06/16/2020  . COLONOSCOPY  03/15/2023  . DEXA SCAN  Completed  . Hepatitis C Screening  Completed    Qualifies for Shingles Vaccine: Yes  Cancer Screenings: Lung: Low Dose CT Chest recommended if Age 70-80 years, 30 pack-year currently smoking OR have quit w/in 15 years. Patient does not qualify. Breast: Up to date on Mammogram: No, declined   Up to  date of Bone Density/Dexa: No, declined Colorectal:completed 03/14/2018  Additional Screenings:  Hepatitis C Screening: 09/30/2016     Plan:   Patient will continue to work in her garden everyday.  I have personally reviewed and noted the following in the patient's chart:   . Medical and social history . Use of alcohol, tobacco or illicit drugs  . Current medications and supplements . Functional ability and status . Nutritional status . Physical activity . Advanced directives . List of other physicians . Hospitalizations, surgeries, and ER visits in previous 12 months . Vitals . Screenings to include cognitive, depression, and falls . Referrals and appointments  In addition, I have reviewed and discussed with patient certain preventive protocols, quality metrics, and best practice recommendations. A written personalized care plan for preventive services as well as general preventive health recommendations were provided to patient.     Andrez Grime, LPN   [id number]

## 2020-03-21 ENCOUNTER — Other Ambulatory Visit: Payer: Self-pay | Admitting: Primary Care

## 2020-03-21 DIAGNOSIS — E785 Hyperlipidemia, unspecified: Secondary | ICD-10-CM

## 2020-04-02 ENCOUNTER — Other Ambulatory Visit (INDEPENDENT_AMBULATORY_CARE_PROVIDER_SITE_OTHER): Payer: Medicare Other

## 2020-04-02 ENCOUNTER — Other Ambulatory Visit: Payer: Self-pay

## 2020-04-02 DIAGNOSIS — E785 Hyperlipidemia, unspecified: Secondary | ICD-10-CM

## 2020-04-02 LAB — LIPID PANEL
Cholesterol: 243 mg/dL — ABNORMAL HIGH (ref 0–200)
HDL: 44 mg/dL (ref >39.00)
LDL Cholesterol: 180 mg/dL — ABNORMAL HIGH (ref 0–99)
NonHDL: 199.2
Total CHOL/HDL Ratio: 6
Triglycerides: 95 mg/dL (ref 0.0–149.0)
VLDL: 19 mg/dL (ref 0.0–40.0)

## 2020-04-02 LAB — COMPREHENSIVE METABOLIC PANEL
ALT: 14 U/L (ref 0–35)
AST: 19 U/L (ref 0–37)
Albumin: 4.3 g/dL (ref 3.5–5.2)
Alkaline Phosphatase: 63 U/L (ref 39–117)
BUN: 16 mg/dL (ref 6–23)
CO2: 32 mEq/L (ref 19–32)
Calcium: 9.5 mg/dL (ref 8.4–10.5)
Chloride: 105 mEq/L (ref 96–112)
Creatinine, Ser: 0.79 mg/dL (ref 0.40–1.20)
GFR: 72 mL/min (ref >60.00)
Glucose, Bld: 100 mg/dL — ABNORMAL HIGH (ref 70–99)
Potassium: 4.6 mEq/L (ref 3.5–5.1)
Sodium: 140 mEq/L (ref 135–145)
Total Bilirubin: 0.6 mg/dL (ref 0.2–1.2)
Total Protein: 6.7 g/dL (ref 6.0–8.3)

## 2020-04-02 LAB — CBC
HCT: 39.5 % (ref 36.0–46.0)
Hemoglobin: 13.5 g/dL (ref 12.0–15.0)
MCHC: 34.1 g/dL (ref 30.0–36.0)
MCV: 88.6 fl (ref 78.0–100.0)
Platelets: 214 10*3/uL (ref 150.0–400.0)
RBC: 4.45 Mil/uL (ref 3.87–5.11)
RDW: 13.7 % (ref 11.5–15.5)
WBC: 5.8 10*3/uL (ref 4.0–10.5)

## 2020-04-05 ENCOUNTER — Encounter: Payer: Self-pay | Admitting: Primary Care

## 2020-04-05 ENCOUNTER — Other Ambulatory Visit: Payer: Self-pay

## 2020-04-05 ENCOUNTER — Ambulatory Visit (INDEPENDENT_AMBULATORY_CARE_PROVIDER_SITE_OTHER): Payer: Medicare Other | Admitting: Primary Care

## 2020-04-05 VITALS — BP 124/80 | HR 80 | Temp 96.5°F | Ht 62.5 in | Wt 139.0 lb

## 2020-04-05 DIAGNOSIS — K589 Irritable bowel syndrome without diarrhea: Secondary | ICD-10-CM | POA: Diagnosis not present

## 2020-04-05 DIAGNOSIS — F411 Generalized anxiety disorder: Secondary | ICD-10-CM | POA: Diagnosis not present

## 2020-04-05 DIAGNOSIS — F331 Major depressive disorder, recurrent, moderate: Secondary | ICD-10-CM | POA: Insufficient documentation

## 2020-04-05 DIAGNOSIS — Z23 Encounter for immunization: Secondary | ICD-10-CM

## 2020-04-05 DIAGNOSIS — G4700 Insomnia, unspecified: Secondary | ICD-10-CM | POA: Diagnosis not present

## 2020-04-05 DIAGNOSIS — E2839 Other primary ovarian failure: Secondary | ICD-10-CM | POA: Diagnosis not present

## 2020-04-05 DIAGNOSIS — Z1231 Encounter for screening mammogram for malignant neoplasm of breast: Secondary | ICD-10-CM

## 2020-04-05 DIAGNOSIS — E785 Hyperlipidemia, unspecified: Secondary | ICD-10-CM

## 2020-04-05 DIAGNOSIS — K219 Gastro-esophageal reflux disease without esophagitis: Secondary | ICD-10-CM

## 2020-04-05 MED ORDER — SERTRALINE HCL 25 MG PO TABS: 25.0000 mg | ORAL_TABLET | Freq: Every day | ORAL | 1 refills | Status: DC

## 2020-04-05 MED ORDER — ZOSTER VAC RECOMB ADJUVANTED 50 MCG/0.5ML IM SUSR: 0.5000 mL | Freq: Once | INTRAMUSCULAR | 1 refills | Status: AC

## 2020-04-05 NOTE — Assessment & Plan Note (Signed)
Continued and likely secondary to anxiety for which we will treat today. Continue PRN hydroxyzine. Initiating Zoloft 25 mg daily. Follow up in 6 weeks.

## 2020-04-05 NOTE — Assessment & Plan Note (Signed)
Infrequent symptoms. Continue to monitor.  

## 2020-04-05 NOTE — Assessment & Plan Note (Signed)
Continued and uncontrolled at this point.  GAD-7 score of 11 today.  Discussed options for treatment including medication and therapy, she elects for medication.  Prescription for Zoloft 25 mg sent to pharmacy. We discussed possible side effects of headache, GI upset, drowsiness, and SI/HI. If thoughts of SI/HI develop, we discussed to present to the emergency immediately. Patient verbalized understanding.   Follow up in 6 weeks for re-evaluation.

## 2020-04-05 NOTE — Progress Notes (Signed)
Subjective:    Patient ID: Kayla Gallegos, female    DOB: 28-Apr-1951, 69 y.o.   MRN: [id number]  HPI  This visit occurred during the SARS-CoV-2 public health emergency.  Safety protocols were in place, including screening questions prior to the visit, additional usage of staff PPE, and extensive cleaning of exam room while observing appropriate contact time as indicated for disinfecting solutions.   Kayla Gallegos is a 69 year old female who presents today for [place] Part 2.   She would like to discuss insomnia, anxiety, and depression symptoms. Symptoms include insomnia, feeling anxious, feeling nervous, increased stress. GAD 7 score of 11 and PHQ 9 score of 12 today. She is interested in treatment. She's never been for anxiety and depression.   Immunizations: -Tetanus: Declines -Influenza: Declines -Shingles: Declines -Pneumonia:  Declines   Mammogram: No recent imaging Dexa: No recent imaging  Colonoscopy: Completed in 2019, due in 2024 Hep C Screen: Negative  BP Readings from Last 3 Encounters:  04/05/20 124/80  11/27/19 140/90  11/20/19 126/65    The 10-year ASCVD risk score Mikey Bussing DC Jr., et al., 2013) is: 9%   Values used to calculate the score:     Age: 61 years     Sex: Female     Is Non-Hispanic African American: No     Diabetic: No     Tobacco smoker: No     Systolic Blood Pressure: [id number] mmHg     Is BP treated: No     HDL Cholesterol: 44 mg/dL     Total Cholesterol: 243 mg/dL   Review of Systems  Eyes: Negative for visual disturbance.  Respiratory: Negative for shortness of breath.   Cardiovascular: Negative for chest pain.  Neurological: Negative for dizziness and headaches.  Psychiatric/Behavioral: Positive for sleep disturbance. The patient is nervous/anxious.        See HPI       Past Medical History:  Diagnosis Date  . Hyperlipidemia   . Kidney stones   . Recurrent UTI (urinary tract infection)      Social History   Socioeconomic History  .  Marital status: Married    Spouse name: Not on file  . Number of children: Not on file  . Years of education: Not on file  . Highest education level: Not on file  Occupational History  . Not on file  Tobacco Use  . Smoking status: Never Smoker  . Smokeless tobacco: Never Used  Substance and Sexual Activity  . Alcohol use: No  . Drug use: No  . Sexual activity: Not on file  Other Topics Concern  . Not on file  Social History Narrative  . Not on file   Social Determinants of Health   Financial Resource Strain: Low Risk   . Difficulty of Paying Living Expenses: Not hard at all  Food Insecurity: No Food Insecurity  . Worried About Charity fundraiser in the Last Year: Never true  . Ran Out of Food in the Last Year: Never true  Transportation Needs: No Transportation Needs  . Lack of Transportation (Medical): No  . Lack of Transportation (Non-Medical): No  Physical Activity: Inactive  . Days of Exercise per Week: 0 days  . Minutes of Exercise per Session: 0 min  Stress: Stress Concern Present  . Feeling of Stress : To some extent  Social Connections:   . Frequency of Communication with Friends and Family:   . Frequency of Social Gatherings with Friends and Family:   .  Attends Religious Services:   . Active Member of Clubs or Organizations:   . Attends Archivist Meetings:   Marland Kitchen Marital Status:   Intimate Partner Violence: Not At Risk  . Fear of Current or Ex-Partner: No  . Emotionally Abused: No  . Physically Abused: No  . Sexually Abused: No    Past Surgical History:  Procedure Laterality Date  . ABDOMINAL HYSTERECTOMY  1976  . COLONOSCOPY    . LAPAROSCOPIC CHOLECYSTECTOMY  1990  . TONSILLECTOMY      Family History  Problem Relation Age of Onset  . Colon cancer Sister 46  . Colon cancer Brother 95  . Stomach cancer Neg Hx     Allergies  Allergen Reactions  . Codeine Nausea And Vomiting and Nausea Only    dizziness    Current Outpatient  Medications on File Prior to Visit  Medication Sig Dispense Refill  . ascorbic acid (VITAMIN C) 500 MG tablet Take by mouth daily.    . B Complex Vitamins (VITAMIN-B COMPLEX PO) Take by mouth daily.    Marland Kitchen FOLIC ACID PO Take by mouth daily.    . hydrOXYzine (ATARAX/VISTARIL) 10 MG tablet Take 1-2 tablets (10-20 mg total) by mouth at bedtime as needed for anxiety (sleep). 30 tablet 0  . lactulose (CEPHULAC) 10 g packet Take 1 packet (10 g total) by mouth 3 (three) times daily. 30 each 0  . MAGNESIUM CARBONATE PO Take 1 capsule by mouth daily.    . Probiotic Product (PROBIOTIC DAILY PO) Take by mouth daily.     Current Facility-Administered Medications on File Prior to Visit  Medication Dose Route Frequency Provider Last Rate Last Admin  . 0.9 %  sodium chloride infusion  500 mL Intravenous Once Nandigam, Kavitha V, MD        BP 124/80   Pulse 80   Temp (!) 96.5 F (35.8 C) (Temporal)   Ht 5' 2.5" (1.588 m)   Wt 139 lb (63 kg)   SpO2 98%   BMI 25.02 kg/m    Objective:   Physical Exam  Constitutional: She appears well-nourished.  Cardiovascular: Normal rate and regular rhythm.  Respiratory: Effort normal and breath sounds normal.  GI: Soft. Bowel sounds are normal. There is no abdominal tenderness.  Musculoskeletal:     Cervical back: Neck supple.  Skin: Skin is warm and dry.  Psychiatric: She has a normal mood and affect.           Assessment & Plan:  Health maintenance:  Colonoscopy up-to-date. Mammogram overdue, ordered. Bone density overdue, ordered.  Declines all vaccinations except for Shingrix.  Prescription provided.  Pleas Koch, NP

## 2020-04-05 NOTE — Patient Instructions (Signed)
Start sertraline (Zoloft) 25 mg for anxiety and depression. Start with 1/2 tablet daily for 6 days, then increase to 1 full tablet thereafter.  Call the [facility] to schedule your mammogram and bone density tests.  Start exercising. You should be getting 150 minutes of moderate intensity exercise weekly.  Continue to work on a healthy diet. Ensure you are consuming 64 ounces of water daily.  Take the Shingles vaccine to your pharmacy.  Please schedule a follow up visit for 6 weeks as discussed.   It was a pleasure to see you today!

## 2020-04-05 NOTE — Assessment & Plan Note (Addendum)
Evident during exam today.  PHQ-9 score of 12.  Discussed options for treatment including medication and therapy, she elects for medication.  Prescription for Zoloft 25 mg sent to pharmacy. We discussed possible side effects of headache, GI upset, drowsiness, and SI/HI. If thoughts of SI/HI develop, we discussed to present to the emergency immediately. Patient verbalized understanding.   Follow up in 6 weeks for re-evaluation.

## 2020-04-05 NOTE — Assessment & Plan Note (Signed)
Increase in LDL to 180 over the last two years, endorses increased stress and anxiety.  She declines treatment of hyperlipidemia despite ASCVD risk score of 9% and recommendations. Continue to monitor.

## 2020-04-05 NOTE — Assessment & Plan Note (Signed)
Infrequent symptoms, using Pepcid PRN. Continue to monitor.

## 2020-05-03 ENCOUNTER — Other Ambulatory Visit: Payer: Self-pay | Admitting: Primary Care

## 2020-05-13 ENCOUNTER — Ambulatory Visit
Admission: RE | Admit: 2020-05-13 | Discharge: 2020-05-13 | Disposition: A | Discharge status: Home / Self Care | Payer: Medicare Other | Source: Ambulatory Visit | Attending: Primary Care | Admitting: Primary Care

## 2020-05-13 ENCOUNTER — Other Ambulatory Visit: Payer: Self-pay

## 2020-05-13 DIAGNOSIS — E2839 Other primary ovarian failure: Secondary | ICD-10-CM

## 2020-05-13 DIAGNOSIS — Z1231 Encounter for screening mammogram for malignant neoplasm of breast: Secondary | ICD-10-CM

## 2020-05-17 ENCOUNTER — Other Ambulatory Visit: Payer: Self-pay

## 2020-05-17 ENCOUNTER — Other Ambulatory Visit: Payer: Medicare Other

## 2020-05-17 ENCOUNTER — Ambulatory Visit (INDEPENDENT_AMBULATORY_CARE_PROVIDER_SITE_OTHER): Payer: Medicare Other | Admitting: Primary Care

## 2020-05-17 ENCOUNTER — Encounter: Payer: Self-pay | Admitting: Primary Care

## 2020-05-17 DIAGNOSIS — F331 Major depressive disorder, recurrent, moderate: Secondary | ICD-10-CM

## 2020-05-17 DIAGNOSIS — F411 Generalized anxiety disorder: Secondary | ICD-10-CM

## 2020-05-17 DIAGNOSIS — G4700 Insomnia, unspecified: Secondary | ICD-10-CM

## 2020-05-17 MED ORDER — SERTRALINE HCL 25 MG PO TABS: 12.5000 mg | ORAL_TABLET | Freq: Every day | ORAL | 1 refills | Status: DC

## 2020-05-17 NOTE — Patient Instructions (Signed)
Continue sertraline (Zoloft) 12.5 mg once daily for anxiety.  It was a pleasure to see you today!

## 2020-05-17 NOTE — Assessment & Plan Note (Signed)
Moderate improvement in symptoms with addition of Zoloft 12.5 mg, bowel side effects have resolved. Given that she's doing so well we will continue Zoloft 12.5 mg. Refills provided.  She will update if anything changes.

## 2020-05-17 NOTE — Progress Notes (Signed)
Subjective:    Patient ID: Kayla Gallegos, female    DOB: [birth year], 69 y.o.   MRN: [id number]  HPI  This visit occurred during the SARS-CoV-2 public health emergency.  Safety protocols were in place, including screening questions prior to the visit, additional usage of staff PPE, and extensive cleaning of exam room while observing appropriate contact time as indicated for disinfecting solutions.   Kayla Gallegos is a 69 year old female with a history of GAD, insomnia, depression who presents today for follow up of anxiety, depression, insomnia.   She was last evaluated on 04/05/20 for her [place] Part 2 when she mentioned symptoms of insomnia, feeling anxious, increased stress, etc. GAD 7 score of 11 and PHQ 9 score of 12. She was already managed on hydroxyzine PRN for anxiety/sleep, but given her symptoms we initiated Zoloft 25 mg and asked her to follow up today.  Since her last visit she is doing better. Positive effects include sleeping better, feeling less "emotional", feeling less anxious. She is actually still taking 1/2 tablet of the Zoloft as she noticed an increase in bowel movements initially, was afraid it would get worse with the full tablet. Her bowels have returned to normal.   Review of Systems  Respiratory: Negative for shortness of breath.   Cardiovascular: Negative for chest pain.  Psychiatric/Behavioral: Negative for sleep disturbance. The patient is not nervous/anxious.        Past Medical History:  Diagnosis Date  . Hyperlipidemia   . Kidney stones   . Recurrent UTI (urinary tract infection)      Social History   Socioeconomic History  . Marital status: Married    Spouse name: Not on file  . Number of children: Not on file  . Years of education: Not on file  . Highest education level: Not on file  Occupational History  . Not on file  Tobacco Use  . Smoking status: Never Smoker  . Smokeless tobacco: Never Used  Vaping Use  . Vaping Use: Never used    Substance and Sexual Activity  . Alcohol use: No  . Drug use: No  . Sexual activity: Not on file  Other Topics Concern  . Not on file  Social History Narrative  . Not on file   Social Determinants of Health   Financial Resource Strain: Low Risk   . Difficulty of Paying Living Expenses: Not hard at all  Food Insecurity: No Food Insecurity  . Worried About Charity fundraiser in the Last Year: Never true  . Ran Out of Food in the Last Year: Never true  Transportation Needs: No Transportation Needs  . Lack of Transportation (Medical): No  . Lack of Transportation (Non-Medical): No  Physical Activity: Inactive  . Days of Exercise per Week: 0 days  . Minutes of Exercise per Session: 0 min  Stress: Stress Concern Present  . Feeling of Stress : To some extent  Social Connections:   . Frequency of Communication with Friends and Family:   . Frequency of Social Gatherings with Friends and Family:   . Attends Religious Services:   . Active Member of Clubs or Organizations:   . Attends Archivist Meetings:   Marland Kitchen Marital Status:   Intimate Partner Violence: Not At Risk  . Fear of Current or Ex-Partner: No  . Emotionally Abused: No  . Physically Abused: No  . Sexually Abused: No    Past Surgical History:  Procedure Laterality Date  . ABDOMINAL  HYSTERECTOMY  1976  . COLONOSCOPY    . LAPAROSCOPIC CHOLECYSTECTOMY  1990  . TONSILLECTOMY      Family History  Problem Relation Age of Onset  . Colon cancer Sister 3  . Colon cancer Brother 38  . Stomach cancer Neg Hx     Allergies  Allergen Reactions  . Codeine Nausea And Vomiting and Nausea Only    dizziness    Current Outpatient Medications on File Prior to Visit  Medication Sig Dispense Refill  . ascorbic acid (VITAMIN C) 500 MG tablet Take by mouth daily.    . B Complex Vitamins (VITAMIN-B COMPLEX PO) Take by mouth daily.    . Cyanocobalamin (VITAMIN B 12 PO) Take by mouth.    . FOLIC ACID PO Take by mouth  daily.    . hydrOXYzine (ATARAX/VISTARIL) 10 MG tablet Take 1-2 tablets (10-20 mg total) by mouth at bedtime as needed for anxiety (sleep). 30 tablet 0  . lactulose (CEPHULAC) 10 g packet Take 1 packet (10 g total) by mouth 3 (three) times daily. 30 each 0  . MAGNESIUM CARBONATE PO Take 1 capsule by mouth daily.    Marland Kitchen MAGNESIUM PO Take by mouth.    . Multiple Vitamins-Minerals (ZINC PO) Take by mouth.    . Probiotic Product (PROBIOTIC DAILY PO) Take by mouth daily.    . sertraline (ZOLOFT) 25 MG tablet Take 1 tablet (25 mg total) by mouth daily. For anxiety and depression. 30 tablet 1  . Vitamin D-Vitamin K (K2 PLUS D3) [phone] MCG-UNIT TABS Take by mouth.     Current Facility-Administered Medications on File Prior to Visit  Medication Dose Route Frequency Provider Last Rate Last Admin  . 0.9 %  sodium chloride infusion  500 mL Intravenous Once Nandigam, Kavitha V, MD        BP 122/78   Pulse 82   Temp (!) 95.6 F (35.3 C) (Temporal)   Ht 5' 2.5" (1.588 m)   Wt 138 lb 8 oz (62.8 kg)   SpO2 98%   BMI 24.93 kg/m    Objective:   Physical Exam Cardiovascular:     Rate and Rhythm: Normal rate and regular rhythm.  Pulmonary:     Effort: Pulmonary effort is normal.     Breath sounds: Normal breath sounds.  Musculoskeletal:     Cervical back: Neck supple.  Skin:    General: Skin is warm and dry.            Assessment & Plan:

## 2020-05-22 ENCOUNTER — Encounter: Payer: Self-pay | Admitting: Primary Care

## 2020-05-22 ENCOUNTER — Ambulatory Visit (INDEPENDENT_AMBULATORY_CARE_PROVIDER_SITE_OTHER): Payer: Medicare Other | Admitting: Primary Care

## 2020-05-22 ENCOUNTER — Other Ambulatory Visit: Payer: Self-pay

## 2020-05-22 VITALS — BP 120/70 | HR 80 | Temp 96.4°F | Ht 62.5 in | Wt 139.5 lb

## 2020-05-22 DIAGNOSIS — S81832A Puncture wound without foreign body, left lower leg, initial encounter: Secondary | ICD-10-CM | POA: Diagnosis not present

## 2020-05-22 DIAGNOSIS — T148XXA Other injury of unspecified body region, initial encounter: Secondary | ICD-10-CM | POA: Insufficient documentation

## 2020-05-22 DIAGNOSIS — Z23 Encounter for immunization: Secondary | ICD-10-CM

## 2020-05-22 NOTE — Progress Notes (Signed)
Subjective:    Patient ID: Kayla Gallegos, female    DOB: 11-10-1951, 69 y.o.   MRN: [id number]  HPI  This visit occurred during the SARS-CoV-2 public health emergency.  Safety protocols were in place, including screening questions prior to the visit, additional usage of staff PPE, and extensive cleaning of exam room while observing appropriate contact time as indicated for disinfecting solutions.   [name] is a 69 year old female with a history of lumbar radiculopathy, recurrent UTI, hyperlipidemia, paresthesia who presents today with a chief complaint of lower extremity injury.  She was working in the [employer] yesterday, came across a rusty cage that punctured her left anterior low extremity. She immediately cleansed the site well with alcohol and water, also applied oregano oil. She denies fevers. She's noticed mild erythema around the site, no worse than yesterday. She is unsure of her last tetanus vaccination.   Review of Systems  Constitutional: Negative for fever.  Skin: Positive for color change and wound.       Past Medical History:  Diagnosis Date   Hyperlipidemia    Kidney stones    Recurrent UTI (urinary tract infection)      Social History   Socioeconomic History   Marital status: Married    Spouse name: Not on file   Number of children: Not on file   Years of education: Not on file   Highest education level: Not on file  Occupational History   Not on file  Tobacco Use   Smoking status: Never Smoker   Smokeless tobacco: Never Used  Vaping Use   Vaping Use: Never used  Substance and Sexual Activity   Alcohol use: No   Drug use: No   Sexual activity: Not on file  Other Topics Concern   Not on file  Social History Narrative   Not on file   Social Determinants of Health   Financial Resource Strain: Low Risk    Difficulty of Paying Living Expenses: Not hard at all  Food Insecurity: No Food Insecurity   Worried About Charity fundraiser  in the Last Year: Never true   [place] in the Last Year: Never true  Transportation Needs: No Transportation Needs   Lack of Transportation (Medical): No   Lack of Transportation (Non-Medical): No  Physical Activity: Inactive   Days of Exercise per Week: 0 days   Minutes of Exercise per Session: 0 min  Stress: Stress Concern Present   Feeling of Stress : To some extent  Social Connections:    Frequency of Communication with Friends and Family:    Frequency of Social Gatherings with Friends and Family:    Attends Religious Services:    Active Member of Clubs or Organizations:    Attends Music therapist:    Marital Status:   Intimate Partner Violence: Not At Risk   Fear of Current or Ex-Partner: No   Emotionally Abused: No   Physically Abused: No   Sexually Abused: No    Past Surgical History:  Procedure Laterality Date   ABDOMINAL HYSTERECTOMY  1976   COLONOSCOPY     LAPAROSCOPIC CHOLECYSTECTOMY  1990   TONSILLECTOMY      Family History  Problem Relation Age of Onset   Colon cancer Sister 65   Colon cancer Brother 44   Stomach cancer Neg Hx     Allergies  Allergen Reactions   Codeine Nausea And Vomiting and Nausea Only    dizziness  Current Outpatient Medications on File Prior to Visit  Medication Sig Dispense Refill   ascorbic acid (VITAMIN C) 500 MG tablet Take by mouth daily.     B Complex Vitamins (VITAMIN-B COMPLEX PO) Take by mouth daily.     Cyanocobalamin (VITAMIN B 12 PO) Take by mouth.     FOLIC ACID PO Take by mouth daily.     hydrOXYzine (ATARAX/VISTARIL) 10 MG tablet Take 1-2 tablets (10-20 mg total) by mouth at bedtime as needed for anxiety (sleep). 30 tablet 0   lactulose (CEPHULAC) 10 g packet Take 1 packet (10 g total) by mouth 3 (three) times daily. 30 each 0   MAGNESIUM CARBONATE PO Take 1 capsule by mouth daily.     MAGNESIUM PO Take by mouth.     Multiple Vitamins-Minerals (ZINC PO)  Take by mouth.     Probiotic Product (PROBIOTIC DAILY PO) Take by mouth daily.     sertraline (ZOLOFT) 25 MG tablet Take 0.5 tablets (12.5 mg total) by mouth daily. For anxiety and depression. 45 tablet 1   Vitamin D-Vitamin K (K2 PLUS D3) [phone] MCG-UNIT TABS Take by mouth.     Current Facility-Administered Medications on File Prior to Visit  Medication Dose Route Frequency Provider Last Rate Last Admin   0.9 %  sodium chloride infusion  500 mL Intravenous Once Nandigam, Kavitha V, MD        BP 120/70    Pulse 80    Temp (!) 96.4 F (35.8 C) (Temporal)    Ht 5' 2.5" (1.588 m)    Wt 139 lb 8 oz (63.3 kg)    SpO2 97%    BMI 25.11 kg/m    Objective:   Physical Exam Skin:    General: Skin is warm and dry.          Comments: 0.25 cm puncture site to left lower extremity as noted. Slight erythema surrounding puncture site to left lower portion of lower extremity.  Non tender. No drainage. Puncture site scabbed over.             Assessment & Plan:

## 2020-05-22 NOTE — Addendum Note (Signed)
Addended by: Jacqualin Combes on: 05/22/2020 04:31 PM   Modules accepted: Orders

## 2020-05-22 NOTE — Patient Instructions (Signed)
Please monitor the site of your wound, notify me if you see increased redness, notice warmth, swelling.  It was a pleasure to see you today!

## 2020-05-22 NOTE — Assessment & Plan Note (Signed)
Acute to the left lower extremity, less than 24 hours ago. No obvious infection, appears to be healing well given time of occurrence yesterday.   Tetanus vaccination provided today. Home care and monitoring instructions provided.

## 2020-06-18 DIAGNOSIS — K219 Gastro-esophageal reflux disease without esophagitis: Secondary | ICD-10-CM

## 2020-06-18 DIAGNOSIS — R058 Other specified cough: Secondary | ICD-10-CM

## 2020-06-19 MED ORDER — OMEPRAZOLE 20 MG PO CPDR: 20.0000 mg | DELAYED_RELEASE_CAPSULE | Freq: Every day | ORAL | 0 refills | Status: DC

## 2020-07-11 ENCOUNTER — Encounter: Payer: Self-pay | Admitting: Gastroenterology

## 2020-07-11 ENCOUNTER — Ambulatory Visit (INDEPENDENT_AMBULATORY_CARE_PROVIDER_SITE_OTHER): Payer: Medicare Other | Admitting: Gastroenterology

## 2020-07-11 VITALS — BP 140/68 | HR 83 | Ht 62.5 in | Wt 136.2 lb

## 2020-07-11 DIAGNOSIS — J029 Acute pharyngitis, unspecified: Secondary | ICD-10-CM | POA: Insufficient documentation

## 2020-07-11 DIAGNOSIS — K219 Gastro-esophageal reflux disease without esophagitis: Secondary | ICD-10-CM | POA: Diagnosis not present

## 2020-07-11 DIAGNOSIS — R059 Cough, unspecified: Secondary | ICD-10-CM | POA: Insufficient documentation

## 2020-07-11 DIAGNOSIS — R05 Cough: Secondary | ICD-10-CM | POA: Diagnosis not present

## 2020-07-11 NOTE — Progress Notes (Signed)
07/11/2020 Kayla Gallegos [id number] [birth year]   HISTORY OF PRESENT ILLNESS: This is a pleasant 69 year old female who is now a patient of Dr. Woodward Ku.  She presents here today for evaluation regarding acid reflux related issues.  She says that she has always had acid reflux issues on and off since 1985 at least.  She has taken medication on and off over the years, but had not been on anything recently.  She said that recently she keeps waking up in the middle the night with sensation of stuff in her throat.  She says she has been to ENT 3 times for complaints of pain in her ears and also has a persistent cough.  She was given a prescription for Prilosec, but it was expensive so she began taking over-the-counter omeprazole 20 mg daily about 3 weeks ago.  Since beginning that medication she says that she has noticed some improvement.  She is currently taking that first thing in the morning.  She says that she tries to be very strict with her diet.  She usually does not eat any later than 3:30 in the afternoon and she really tries to avoid any offending type foods.    Past Medical History:  Diagnosis Date  . Hyperlipidemia   . Kidney stones   . Recurrent UTI (urinary tract infection)    Past Surgical History:  Procedure Laterality Date  . ABDOMINAL HYSTERECTOMY  1976  . BACK SURGERY  08/2018   [facility]  . COLONOSCOPY    . LAPAROSCOPIC CHOLECYSTECTOMY  1990  . TONSILLECTOMY      reports that she has never smoked. She has never used smokeless tobacco. She reports that she does not drink alcohol and does not use drugs. family history includes Colon cancer (age of onset: 38) in her brother; Colon cancer (age of onset: 77) in her sister. Allergies  Allergen Reactions  . Codeine Nausea And Vomiting and Nausea Only    dizziness      Outpatient Encounter Medications as of 07/11/2020  Medication Sig  . ascorbic acid (VITAMIN C) 500 MG tablet Take by mouth daily.  . Cyanocobalamin  (VITAMIN B 12 PO) Take by mouth.  . Famotidine (PEPCID PO) Take 1 tablet by mouth daily.  Marland Kitchen FOLIC ACID PO Take by mouth daily.  Marland Kitchen MAGNESIUM PO Take by mouth.  . Multiple Vitamins-Minerals (ZINC PO) Take by mouth.  Marland Kitchen NIACIN PO Take by mouth.  . Probiotic Product (PROBIOTIC DAILY PO) Take by mouth daily.  . SELENIUM PO Take 1 tablet by mouth daily.  . sertraline (ZOLOFT) 25 MG tablet Take 0.5 tablets (12.5 mg total) by mouth daily. For anxiety and depression.  . Vitamin D-Vitamin K (K2 PLUS D3) [phone] MCG-UNIT TABS Take by mouth.  . B Complex Vitamins (VITAMIN-B COMPLEX PO) Take by mouth daily. (Patient not taking: Reported on 07/11/2020)  . lactulose (CEPHULAC) 10 g packet Take 1 packet (10 g total) by mouth 3 (three) times daily.  . [DISCONTINUED] hydrOXYzine (ATARAX/VISTARIL) 10 MG tablet Take 1-2 tablets (10-20 mg total) by mouth at bedtime as needed for anxiety (sleep).  . [DISCONTINUED] MAGNESIUM CARBONATE PO Take 1 capsule by mouth daily.  . [DISCONTINUED] omeprazole (PRILOSEC) 20 MG capsule Take 1 capsule (20 mg total) by mouth at bedtime. For cough and reflux.   Facility-Administered Encounter Medications as of 07/11/2020  Medication  . 0.9 %  sodium chloride infusion     REVIEW OF SYSTEMS  : All other systems reviewed  and negative except where noted in the History of Present Illness.   PHYSICAL EXAM: BP 140/68   Pulse 83   Ht 5' 2.5" (1.588 m)   Wt 136 lb 4 oz (61.8 kg)   BMI 24.52 kg/m  General: Well developed white female in no acute distress Head: Normocephalic and atraumatic Eyes:  Sclerae anicteric, conjunctiva pink. Ears: Normal auditory acuity Lungs: Clear throughout to auscultation; no increased WOB. Heart: Regular rate and rhythm; no M/R/G. Abdomen: Soft, non-distended.  BS present.  Non-tender. Musculoskeletal: Symmetrical with no gross deformities  Skin: No lesions on visible extremities Extremities: No edema  Neurological: Alert oriented x 4, grossly  non-focal Psychological:  Alert and cooperative. Normal mood and affect  ASSESSMENT AND PLAN: *GERD/cough/sore throat:  Just started OTC omeprazole 20 mg daily about 3 weeks ago with some improvement.  Symptoms mostly in the evening and at night so I have asked her to move her medication to be taken before her evening meal.  Will plan for EGD with Dr. Silverio Decamp.  The risks, benefits, and alternatives to EGD were discussed with the patient and she consents to proceed.   **Patient wanted me to note that upon discussion today she was telling me how she had a lot of abdominal pain after her last colonoscopy.  It looks like she was suspected to have some diverticulitis findings during her procedure so Cipro and Flagyl were prescribed.  She says that she was never aware of the antibiotics and that they were never received although it does look like they were sent to her pharmacy.  I am not sure what happened in that situation as it was a few years ago, but I suspect that the abdominal pain she experienced after her procedure was related to those findings.  CC:  Pleas Koch, NP

## 2020-07-11 NOTE — Patient Instructions (Signed)
If you are age 70 or older, your body mass index should be between 23-30. Your Body mass index is 24.52 kg/m. If this is out of the aforementioned range listed, please consider follow up with your Primary Care Provider.  If you are age 20 or younger, your body mass index should be between 19-25. Your Body mass index is 24.52 kg/m. If this is out of the aformentioned range listed, please consider follow up with your Primary Care Provider.   You have been scheduled for an endoscopy. Please follow written instructions given to you at your visit today. If you use inhalers (even only as needed), please bring them with you on the day of your procedure.  Due to recent changes in healthcare laws, you may see the results of your imaging and laboratory studies on MyChart before your provider has had a chance to review them.  We understand that in some cases there may be results that are confusing or concerning to you. Not all laboratory results come back in the same time frame and the provider may be waiting for multiple results in order to interpret others.  Please give [place] 48 hours in order for your provider to thoroughly review all the results before contacting the office for clarification of your results.

## 2020-07-12 NOTE — Progress Notes (Signed)
Reviewed and agree with documentation and assessment and plan. K. Veena Audi Conover , MD   

## 2020-07-30 DIAGNOSIS — Z961 Presence of intraocular lens: Secondary | ICD-10-CM | POA: Diagnosis not present

## 2020-07-30 DIAGNOSIS — Z9841 Cataract extraction status, right eye: Secondary | ICD-10-CM | POA: Diagnosis not present

## 2020-07-30 DIAGNOSIS — H52213 Irregular astigmatism, bilateral: Secondary | ICD-10-CM | POA: Diagnosis not present

## 2020-07-30 DIAGNOSIS — Z9842 Cataract extraction status, left eye: Secondary | ICD-10-CM | POA: Diagnosis not present

## 2020-08-05 ENCOUNTER — Telehealth: Payer: Self-pay

## 2020-08-05 DIAGNOSIS — S71111A Laceration without foreign body, right thigh, initial encounter: Secondary | ICD-10-CM | POA: Diagnosis not present

## 2020-08-05 NOTE — Telephone Encounter (Signed)
I spoke with pt; pt cut rt lower leg on metal rods for tomato stakes (were rusty) this morning; pt said the cut is about 3 inches. [facility] is [facility] in [place] to [facility]ures for cut on leg. Pt said it is still bleeding. FYI to Gentry Fitz NP.

## 2020-08-05 NOTE — Telephone Encounter (Signed)
noted 

## 2020-08-06 ENCOUNTER — Telehealth: Payer: Self-pay

## 2020-08-06 NOTE — Telephone Encounter (Signed)
Please have her monitor the site for symptoms of infection that include redness, drainage, swelling, increased pain. I would like to see her for suture removal, or sooner if those symptoms above occur.

## 2020-08-06 NOTE — Telephone Encounter (Signed)
On 08/05/20 pt did go to [facility] in [place] and received 5 stitches on rt thigh about 4" above bend of knee on back of leg. Pt said about 2 hours ago pt is aware of a dull achy pain where sutures are located. Pt said it does hurt worse when up walking on leg. Pt does not see any drainage on gauze that pt put over bloody gauze last night; pt does not see any redness of skin and no swelling. Pt does not have a fever. Pt is also feeling really tired and pt said the UC did not give pt any abx. Pt wonders if she needs abx since the cut was deep. Pt said her husband will be home later and he is going to help her change the dressing when he gets home. Pt does not want to have problems with her leg and go back to hospital. Pt request cb after Gentry Fitz NP reviews the note. [facility].

## 2020-08-07 NOTE — Telephone Encounter (Signed)
Called patient she will let our office now if any symptoms of infection. If she has any she will give our office call made app for f/u and suture removal

## 2020-08-14 ENCOUNTER — Encounter: Payer: Self-pay | Admitting: Primary Care

## 2020-08-14 ENCOUNTER — Other Ambulatory Visit: Payer: Self-pay

## 2020-08-14 ENCOUNTER — Ambulatory Visit (INDEPENDENT_AMBULATORY_CARE_PROVIDER_SITE_OTHER): Payer: Medicare Other | Admitting: Primary Care

## 2020-08-14 VITALS — BP 138/64 | HR 98 | Temp 97.6°F | Ht 63.0 in | Wt 138.0 lb

## 2020-08-14 DIAGNOSIS — S71111A Laceration without foreign body, right thigh, initial encounter: Secondary | ICD-10-CM

## 2020-08-14 DIAGNOSIS — Z1152 Encounter for screening for COVID-19: Secondary | ICD-10-CM | POA: Diagnosis not present

## 2020-08-14 DIAGNOSIS — S71111S Laceration without foreign body, right thigh, sequela: Secondary | ICD-10-CM | POA: Insufficient documentation

## 2020-08-14 HISTORY — DX: Laceration without foreign body, right thigh, sequela: S71.111S

## 2020-08-14 LAB — SARS-COV-2 IGG: SARS-COV-2 IgG: 0.02

## 2020-08-14 NOTE — Assessment & Plan Note (Signed)
Sustained 9 days ago, sutured at [facility]. Site appears to be healing well. Removal of 5 sutures today, patient tolerated well. Site cleansed with betadine solution.

## 2020-08-14 NOTE — Progress Notes (Signed)
Subjective:    Patient ID: Kayla Gallegos, female    DOB: [birth year], 69 y.o.   MRN: [id number]  HPI  This visit occurred during the SARS-CoV-2 public health emergency.  Safety protocols were in place, including screening questions prior to the visit, additional usage of staff PPE, and extensive cleaning of exam room while observing appropriate contact time as indicated for disinfecting solutions.   Kayla Gallegos is a 69 year old female who presents today for suture removal. She would also like Covid-19 antibody testing. She has not been vaccinated, but would like to learn if she had Covid-19 earlier this year.   She presented to [facility] on 08/05/20 with a laceration to the right posterior thigh that she sustained a few hours prior to arrival. She was able to cleanse the site immediately. During her visit at urgent care she underwent suture placement with simple interrupted technique and with 5 sutures placed.   She is here today for removal. She denies erythema, drainage, swelling.   Review of Systems  Constitutional: Negative for fever.  Skin: Positive for wound. Negative for color change.       Past Medical History:  Diagnosis Date   Hyperlipidemia    Kidney stones    Recurrent UTI (urinary tract infection)      Social History   Socioeconomic History   Marital status: Married    Spouse name: Not on file   Number of children: Not on file   Years of education: Not on file   Highest education level: Not on file  Occupational History   Not on file  Tobacco Use   Smoking status: Never Smoker   Smokeless tobacco: Never Used  Vaping Use   Vaping Use: Never used  Substance and Sexual Activity   Alcohol use: No   Drug use: No   Sexual activity: Not on file  Other Topics Concern   Not on file  Social History Narrative   Not on file   Social Determinants of Health   Financial Resource Strain: Low Risk    Difficulty of Paying Living Expenses: Not  hard at all  Food Insecurity: No Food Insecurity   Worried About Charity fundraiser in the Last Year: Never true   [place] in the Last Year: Never true  Transportation Needs: No Transportation Needs   Lack of Transportation (Medical): No   Lack of Transportation (Non-Medical): No  Physical Activity: Inactive   Days of Exercise per Week: 0 days   Minutes of Exercise per Session: 0 min  Stress: Stress Concern Present   Feeling of Stress : To some extent  Social Connections:    Frequency of Communication with Friends and Family: Not on file   Frequency of Social Gatherings with Friends and Family: Not on file   Attends Religious Services: Not on Electrical engineer or Organizations: Not on file   Attends Archivist Meetings: Not on file   Marital Status: Not on file  Intimate Partner Violence: Not At Risk   Fear of Current or Ex-Partner: No   Emotionally Abused: No   Physically Abused: No   Sexually Abused: No    Past Surgical History:  Procedure Laterality Date   [place]  08/2018   [facility] [place] [facility]   COLONOSCOPY     LAPAROSCOPIC CHOLECYSTECTOMY  1990   TONSILLECTOMY      Family History  Problem Relation  Age of Onset   Colon cancer Sister 65   Colon cancer Brother 32   Stomach cancer Neg Hx     Allergies  Allergen Reactions   Codeine Nausea And Vomiting and Nausea Only    dizziness    Current Outpatient Medications on File Prior to Visit  Medication Sig Dispense Refill   ascorbic acid (VITAMIN C) 500 MG tablet Take by mouth daily.     B Complex Vitamins (VITAMIN-B COMPLEX PO) Take by mouth daily. (Patient not taking: Reported on 07/11/2020)     Cyanocobalamin (VITAMIN B 12 PO) Take by mouth.     FOLIC ACID PO Take by mouth daily.     lactulose (CEPHULAC) 10 g packet Take 1 packet (10 g total) by mouth 3 (three) times daily. 30 each 0   MAGNESIUM PO Take by mouth.      Multiple Vitamins-Minerals (ZINC PO) Take by mouth.     NIACIN PO Take by mouth.     omeprazole (PRILOSEC) 20 MG capsule Take 20 mg by mouth daily.     Probiotic Product (PROBIOTIC DAILY PO) Take by mouth daily.     SELENIUM PO Take 1 tablet by mouth daily.     sertraline (ZOLOFT) 25 MG tablet Take 0.5 tablets (12.5 mg total) by mouth daily. For anxiety and depression. 45 tablet 1   Vitamin D-Vitamin K (K2 PLUS D3) [phone] MCG-UNIT TABS Take by mouth.     Current Facility-Administered Medications on File Prior to Visit  Medication Dose Route Frequency Provider Last Rate Last Admin   0.9 %  sodium chloride infusion  500 mL Intravenous Once Nandigam, Kavitha V, MD        BP 138/64    Pulse 98    Temp 97.6 F (36.4 C) (Temporal)    Ht 5\' 3"  (1.6 m)    Wt 138 lb (62.6 kg)    SpO2 98%    BMI 24.45 kg/m    Objective:   Physical Exam Skin:    General: Skin is warm and dry.     Comments: 5 sutures intact to right posterior lower thigh. No surrounding erythema or drainage. Appropriate scabbing noted.  Neurological:     Mental Status: She is alert.            Assessment & Plan:

## 2020-08-14 NOTE — Patient Instructions (Signed)
Monitor the site for redness, swelling, drainage.   It was a pleasure to see you today!

## 2020-08-21 ENCOUNTER — Ambulatory Visit (INDEPENDENT_AMBULATORY_CARE_PROVIDER_SITE_OTHER): Payer: Medicare Other

## 2020-08-21 DIAGNOSIS — Z1159 Encounter for screening for other viral diseases: Secondary | ICD-10-CM

## 2020-08-23 ENCOUNTER — Other Ambulatory Visit: Payer: Self-pay

## 2020-08-23 ENCOUNTER — Ambulatory Visit (AMBULATORY_SURGERY_CENTER): Payer: Medicare Other | Admitting: Gastroenterology

## 2020-08-23 ENCOUNTER — Encounter: Payer: Self-pay | Admitting: Gastroenterology

## 2020-08-23 VITALS — BP 133/76 | HR 82 | Temp 97.3°F | Resp 14 | Ht 63.0 in | Wt 138.0 lb

## 2020-08-23 DIAGNOSIS — K2950 Unspecified chronic gastritis without bleeding: Secondary | ICD-10-CM | POA: Diagnosis not present

## 2020-08-23 DIAGNOSIS — K2970 Gastritis, unspecified, without bleeding: Secondary | ICD-10-CM | POA: Diagnosis not present

## 2020-08-23 DIAGNOSIS — K219 Gastro-esophageal reflux disease without esophagitis: Secondary | ICD-10-CM

## 2020-08-23 MED ORDER — SODIUM CHLORIDE 0.9 % IV SOLN: 500.0000 mL | Freq: Once | INTRAVENOUS | Status: DC

## 2020-08-23 NOTE — Progress Notes (Signed)
PT taken to PACU. Monitors in place. VSS. Report given to RN. 

## 2020-08-23 NOTE — Patient Instructions (Signed)
Read all handouts given to you by your recovery room nurse.  Cut back on the tumeric per Dr. Silverio Decamp.  Thank-you for choosing [place] for your healthcare needs today.  YOU HAD AN ENDOSCOPIC PROCEDURE TODAY AT [facility]:   Refer to the procedure report that was given to you for any specific questions about what was found during the examination.  If the procedure report does not answer your questions, please call your gastroenterologist to clarify.  If you requested that your care partner not be given the details of your procedure findings, then the procedure report has been included in a sealed envelope for you to review at your convenience later.  YOU SHOULD EXPECT: Some feelings of bloating in the abdomen. Passage of more gas than usual.  Walking can help get rid of the air that was put into your GI tract during the procedure and reduce the bloating.   Please Note:  You might notice some irritation and congestion in your nose or some drainage.  This is from the oxygen used during your procedure.  There is no need for concern and it should clear up in a day or so.  SYMPTOMS TO REPORT IMMEDIATELY:    Following upper endoscopy (EGD)  Vomiting of blood or coffee ground material  New chest pain or pain under the shoulder blades  Painful or persistently difficult swallowing  New shortness of breath  Fever of 100F or higher  Black, tarry-looking stools  For urgent or emergent issues, a gastroenterologist can be reached at any hour by calling [phone]. Do not use MyChart messaging for urgent concerns.    DIET:  We do recommend a small meal at first, but then you may proceed to your regular diet.  Drink plenty of fluids but you should avoid alcoholic beverages for 24 hours.  ACTIVITY:  You should plan to take it easy for the rest of today and you should NOT DRIVE or use heavy machinery until tomorrow (because of the sedation medicines used during the test).    FOLLOW  UP: Our staff will call the number listed on your records 48-72 hours following your procedure to check on you and address any questions or concerns that you may have regarding the information given to you following your procedure. If we do not reach you, we will leave a message.  We will attempt to reach you two times.  During this call, we will ask if you have developed any symptoms of COVID 19. If you develop any symptoms (ie: fever, flu-like symptoms, shortness of breath, cough etc.) before then, please call [phone].  If you test positive for Covid 19 in the 2 weeks post procedure, please call and report this information to [place].    If any biopsies were taken you will be contacted by phone or by letter within the next 1-3 weeks.  Please call us at [phone] if you have not heard about the biopsies in 3 weeks.    SIGNATURES/CONFIDENTIALITY: You and/or your care partner have signed paperwork which will be entered into your electronic medical record.  These signatures attest to the fact that that the information above on your After Visit Summary has been reviewed and is understood.  Full responsibility of the confidentiality of this discharge information lies with you and/or your care-partner.

## 2020-08-23 NOTE — Op Note (Signed)
[place] Patient Name: Kayla Gallegos Procedure Date: 08/23/2020 10:06 AM MRN: [id number] Endoscopist: Mauri Pole , MD Age: 69 Referring MD:  Date of Birth: Apr 13, 1951 Gender: Female Account #: [id number] Procedure:                Upper GI endoscopy Indications:              Suspected reflux esophagitis, Esophageal reflux                            symptoms that persist despite appropriate therapy Medicines:                Monitored Anesthesia Care Procedure:                Pre-Anesthesia Assessment:                           - Prior to the procedure, a History and Physical                            was performed, and patient medications and                            allergies were reviewed. The patient's tolerance of                            previous anesthesia was also reviewed. The risks                            and benefits of the procedure and the sedation                            options and risks were discussed with the patient.                            All questions were answered, and informed consent                            was obtained. Prior Anticoagulants: The patient has                            taken no previous anticoagulant or antiplatelet                            agents. ASA Grade Assessment: II - A patient with                            mild systemic disease. After reviewing the risks                            and benefits, the patient was deemed in                            satisfactory condition to undergo the procedure.  After obtaining informed consent, the endoscope was                            passed under direct vision. Throughout the                            procedure, the patient's blood pressure, pulse, and                            oxygen saturations were monitored continuously. The                            Endoscope was introduced through the mouth, and                             advanced to the second part of duodenum. The upper                            GI endoscopy was accomplished without difficulty.                            The patient tolerated the procedure well. Scope In: Scope Out: Findings:                 The Z-line was regular and was found 35 cm from the                            incisors.                           The examined esophagus was normal.                           The gastroesophageal flap valve was visualized                            endoscopically and classified as Hill Grade II                            (fold present, opens with respiration).                           Striped mildly erythematous mucosa without bleeding                            was found in the prepyloric region of the stomach.                            Biopsies were taken with a cold forceps for                            Helicobacter pylori testing.                           The cardia and gastric fundus were normal on  retroflexion.                           The examined duodenum was normal. Complications:            No immediate complications. Estimated Blood Loss:     Estimated blood loss was minimal. Impression:               - Z-line regular, 35 cm from the incisors.                           - Normal esophagus.                           - Gastroesophageal flap valve classified as Hill                            Grade II (fold present, opens with respiration).                           - Erythematous mucosa in the prepyloric region of                            the stomach. Biopsied.                           - Normal examined duodenum. Recommendation:           - Patient has a contact number available for                            emergencies. The signs and symptoms of potential                            delayed complications were discussed with the                            patient. Return to normal activities tomorrow.                             Written discharge instructions were provided to the                            patient.                           - Resume previous diet.                           - Continue present medications.                           - Await pathology results.                           - Follow an antireflux regimen indefinitely.                           -  Return to GI office in 3 months. Mauri Pole, MD 08/23/2020 10:40:10 AM This report has been signed electronically.

## 2020-08-23 NOTE — Progress Notes (Signed)
JB- Check-in  CW - VS   

## 2020-08-23 NOTE — Progress Notes (Signed)
Called to room to assist during endoscopic procedure.  Patient ID and intended procedure confirmed with present staff. Received instructions for my participation in the procedure from the performing physician.  

## 2020-08-23 NOTE — Progress Notes (Signed)
Patient complaints of abdominal pain after colonoscopy in April 2019.  She had findings suspicious of acute diverticulitis, was prescribed antibiotics.  Patient didn't fill the antibiotics as pharmacy didn't call but she didn't check either.  States that she was unaware, she was given a copy of the report and I discussed the findings with both her and her husband after the procedure. Abdominal pain has since improved, and she is currently asymptomatic.  Damaris Hippo , MD [phone]

## 2020-08-27 ENCOUNTER — Telehealth: Payer: Self-pay | Admitting: *Deleted

## 2020-08-27 NOTE — Telephone Encounter (Signed)
  Follow up Call-  Call back number 08/23/2020 03/14/2018  Post procedure Call Back phone  # [phone] cell [phone]  Permission to leave phone message Yes Yes  Some recent data might be hidden     Patient questions:  Do you have a fever, pain , or abdominal swelling? No. Pain Score  0 *  Have you tolerated food without any problems? Yes.    Have you been able to return to your normal activities? Yes.    Do you have any questions about your discharge instructions: Diet   No. Medications  No. Follow up visit  No.  Do you have questions or concerns about your Care? No.  Actions: * If pain score is 4 or above: No action needed, pain <4.  1. Have you developed a fever since your procedure? no  2.   Have you had an respiratory symptoms (SOB or cough) since your procedure? no  3.   Have you tested positive for COVID 19 since your procedure no  4.   Have you had any family members/close contacts diagnosed with the COVID 19 since your procedure?  no   If yes to any of these questions please route to Joylene John, RN and Joella Prince, RN

## 2020-08-27 NOTE — Telephone Encounter (Signed)
  Follow up Call-  Call back number 08/23/2020 03/14/2018  Post procedure Call Back phone  # [phone] cell [phone]  Permission to leave phone message Yes Yes  Some recent data might be hidden     Patient questions:  Message left to call us if necessary.

## 2020-09-03 ENCOUNTER — Encounter: Payer: Self-pay | Admitting: Gastroenterology

## 2020-09-13 ENCOUNTER — Encounter: Payer: Self-pay | Admitting: *Deleted

## 2020-09-13 ENCOUNTER — Other Ambulatory Visit: Payer: Self-pay | Admitting: *Deleted

## 2020-09-13 DIAGNOSIS — N898 Other specified noninflammatory disorders of vagina: Secondary | ICD-10-CM

## 2020-09-13 NOTE — Progress Notes (Signed)
erroneous

## 2020-11-27 ENCOUNTER — Other Ambulatory Visit: Payer: Self-pay

## 2020-11-27 ENCOUNTER — Encounter: Payer: Self-pay | Admitting: Family Medicine

## 2020-11-27 ENCOUNTER — Ambulatory Visit (INDEPENDENT_AMBULATORY_CARE_PROVIDER_SITE_OTHER): Payer: Medicare Other | Admitting: Family Medicine

## 2020-11-27 VITALS — BP 146/77 | HR 74 | Wt 141.6 lb

## 2020-11-27 DIAGNOSIS — Z780 Asymptomatic menopausal state: Secondary | ICD-10-CM

## 2020-11-27 DIAGNOSIS — Z1289 Encounter for screening for malignant neoplasm of other sites: Secondary | ICD-10-CM

## 2020-11-27 DIAGNOSIS — N898 Other specified noninflammatory disorders of vagina: Secondary | ICD-10-CM | POA: Diagnosis not present

## 2020-11-27 DIAGNOSIS — Z124 Encounter for screening for malignant neoplasm of cervix: Secondary | ICD-10-CM

## 2020-11-27 MED ORDER — ESTRADIOL 0.1 MG/GM VA CREA: TOPICAL_CREAM | VAGINAL | 12 refills | Status: DC

## 2020-11-27 NOTE — Patient Instructions (Addendum)
   [email or web address]  For assistance in scheduling a COVID-19 test, please call [phone].

## 2020-11-27 NOTE — Progress Notes (Signed)
   GYNECOLOGY ANNUAL PREVENTATIVE CARE ENCOUNTER NOTE  Subjective:   Kayla Gallegos is a 70 y.o. Marland Kitchen female here for a routine annual gynecologic exam.  Current complaints: none.   Denies abnormal vaginal bleeding, discharge, pelvic pain, problems with intercourse or other gynecologic concerns.    Gynecologic History No LMP recorded. Patient has had a hysterectomy. Contraception: status post hysterectomy Last Pap: NA.  Last mammogram: 04/2020  There are no preventive care reminders to display for this patient.  The following portions of the patient's history were reviewed and updated as appropriate: allergies, current medications, past family history, past medical history, past social history, past surgical history and problem list.  Review of Systems Pertinent items are noted in HPI.   Objective:  BP (!) 146/77   Pulse 74   Wt 141 lb 9.6 oz (64.2 kg)   BMI 25.08 kg/m  CONSTITUTIONAL: Well-developed, well-nourished female in no acute distress.  HENT:  Normocephalic, atraumatic, External right and left ear normal. Oropharynx is clear and moist EYES:  No scleral icterus.  NECK: Normal range of motion, supple, no masses.  Normal thyroid.  SKIN: Skin is warm and dry. No rash noted. Not diaphoretic. No erythema. No pallor. NEUROLOGIC: Alert and oriented to person, place, and time. Normal reflexes, muscle tone coordination. No cranial nerve deficit noted. PSYCHIATRIC: Normal mood and affect. Normal behavior. Normal judgment and thought content. CARDIOVASCULAR: Normal heart rate noted, regular rhythm. 2+ distal pulses. RESPIRATORY: Effort and breath sounds normal, no problems with respiration noted. BREASTS: Declined- UTD on mammogram. ABDOMEN: Soft,  no distention noted.  No tenderness, rebound or guarding.  PELVIC: Not indicated MUSCULOSKELETAL: Normal range of motion.    Assessment and Plan:  1) Annual gynecologic examination: Routine preventative health maintenance measures  emphasized. Due for blood work screenigns in May 2022- cholesterol etc All cancer screenings are UTD Reviewed suncare Reivewed transition to PCP only care unless GYN/vaginal complaint.   1. Vaginal dryness - estradiol (ESTRACE) 0.1 MG/GM vaginal cream; Apply 1 gram per vagina every night for 2 weeks, then apply three times a week  Dispense: 30 g; Refill: 12  2. Post-menopausal - estradiol (ESTRACE) 0.1 MG/GM vaginal cream; Apply 1 gram per vagina every night for 2 weeks, then apply three times a week  Dispense: 30 g; Refill: 12  Please refer to After Visit Summary for other counseling recommendations.   Return in about 1 year (around 11/27/2021).  Caren Macadam, MD, MPH, ABFM Attending [facility]

## 2020-11-29 ENCOUNTER — Other Ambulatory Visit: Payer: Self-pay | Admitting: *Deleted

## 2020-11-29 DIAGNOSIS — Z780 Asymptomatic menopausal state: Secondary | ICD-10-CM

## 2020-11-29 DIAGNOSIS — N898 Other specified noninflammatory disorders of vagina: Secondary | ICD-10-CM

## 2020-11-29 MED ORDER — ESTRADIOL 0.1 MG/GM VA CREA: TOPICAL_CREAM | VAGINAL | 12 refills | Status: DC

## 2020-11-29 NOTE — Progress Notes (Signed)
RX sent to wrong pharmacy.

## 2020-12-04 ENCOUNTER — Telehealth: Payer: Self-pay

## 2020-12-04 NOTE — Telephone Encounter (Signed)
Pt left v/m requesting cb that was entire message. I left v/m requesting pt to cb.

## 2020-12-04 NOTE — Telephone Encounter (Signed)
Left message to return call to our office.  

## 2020-12-05 ENCOUNTER — Other Ambulatory Visit: Payer: Medicare Other

## 2020-12-05 ENCOUNTER — Other Ambulatory Visit: Payer: Self-pay

## 2020-12-05 DIAGNOSIS — R059 Cough, unspecified: Secondary | ICD-10-CM

## 2020-12-06 ENCOUNTER — Encounter: Payer: Self-pay | Admitting: Family Medicine

## 2020-12-06 ENCOUNTER — Telehealth (INDEPENDENT_AMBULATORY_CARE_PROVIDER_SITE_OTHER): Payer: Medicare Other | Admitting: Family Medicine

## 2020-12-06 DIAGNOSIS — J069 Acute upper respiratory infection, unspecified: Secondary | ICD-10-CM | POA: Diagnosis not present

## 2020-12-06 NOTE — Assessment & Plan Note (Signed)
Pt had covid test yesterday- PCR pending result (husband also)  She cannot take covid vaccine (h/o severe allergic rxn in family to vaccines) Disc treating symptoms as she has been (mucinex dm as directed)  Fluids/rest  Watch temp Watch for sob/wheeze (ER parameters discussed)  Will update

## 2020-12-06 NOTE — Patient Instructions (Signed)
Drink fluids and rest  mucinex DM is good for cough and congestion  Nasal saline for congestion as needed  Tylenol for fever or pain or headache  Please alert [place] if symptoms worsen (if severe or short of breath please go to the ER)    We will see how covid test turns out  Continue to isolate yourself

## 2020-12-06 NOTE — Progress Notes (Signed)
Virtual Visit via Video Note  I connected with Kayla Gallegos on 12/06/20 at  8:30 AM EST by a video enabled telemedicine application and verified that I am speaking with the correct person using two identifiers.  Location: Patient: home Provider: office   I discussed the limitations of evaluation and management by telemedicine and the availability of in person appointments. The patient expressed understanding and agreed to proceed.  Parties involved in encounter  Patient: Kayla Gallegos  Provider:  Loura Pardon MD     History of Present Illness: 70 yo pt of [facility] presents for loss of taste/smell and cough   Had covid testing yesterday   Symptoms started 6 d ago  Husband was sick first   Runny nose  Congestion  Ear pressure  Then cough - with some phlegm - colored mucous  No wheezing or sob  Cough is hacking  No sinus pain  Had slt headache on day 2   Low grade temp  No aches   No n/v/d  Did loose smell /taste   She did a rapid test- neg 3 days in   imm status  No flu or covid vaccines  Considering pna vaccines   otc vics  mucinex DM  Tylenol es  Drinks lots of fluids  Saline ns    some fatigue   Patient Active Problem List   Diagnosis Date Noted  . Viral URI with cough 12/06/2020  . Laceration of skin of thigh, right, sequela 08/14/2020  . Cough 07/11/2020  . Sore throat 07/11/2020  . Puncture wound 05/22/2020  . Moderate episode of recurrent major depressive disorder ([place]) 04/05/2020  . Lumbar radiculopathy 06/27/2018  . Vaginal dryness 10/18/2017  . Recurrent UTI 09/30/2016  . Encounter for annual general medical examination with abnormal findings in adult 09/30/2016  . PARESTHESIA 04/01/2009  . HEMATURIA UNSPECIFIED 05/30/2008  . Hyperlipidemia 05/29/2008  . GAD (generalized anxiety disorder) 05/29/2008  . ALLERGIC RHINITIS 05/29/2008  . GERD 05/29/2008  . DIVERTICULOSIS, COLON 05/29/2008  . IBS 05/29/2008  . INSOMNIA-SLEEP  DISORDER-UNSPEC 05/29/2008   Past Medical History:  Diagnosis Date  . Allergy   . Anxiety   . Cataract    bil cateracts removed  . GERD (gastroesophageal reflux disease)   . Hyperlipidemia   . Kidney stones   . Recurrent UTI (urinary tract infection)    Past Surgical History:  Procedure Laterality Date  . ABDOMINAL HYSTERECTOMY  1976  . APPENDECTOMY    . BACK SURGERY  08/2018   [facility]  . COLONOSCOPY    . LAPAROSCOPIC CHOLECYSTECTOMY  1990  . TONSILLECTOMY    . UPPER GASTROINTESTINAL ENDOSCOPY     Social History   Tobacco Use  . Smoking status: Never Smoker  . Smokeless tobacco: Never Used  Vaping Use  . Vaping Use: Never used  Substance Use Topics  . Alcohol use: No  . Drug use: No   Family History  Problem Relation Age of Onset  . Colon cancer Sister 68  . Colon cancer Brother 41  . Stomach cancer Neg Hx   . Esophageal cancer Neg Hx   . Rectal cancer Neg Hx    Allergies  Allergen Reactions  . Codeine Nausea And Vomiting and Nausea Only    dizziness   Current Outpatient Medications on File Prior to Visit  Medication Sig Dispense Refill  . ascorbic acid (VITAMIN C) 500 MG tablet Take by mouth daily.    . B Complex Vitamins (VITAMIN-B COMPLEX PO)  Take by mouth daily.    . Cyanocobalamin (VITAMIN B 12 PO) Take by mouth.    . estradiol (ESTRACE) 0.1 MG/GM vaginal cream Apply 1 gram per vagina every night for 2 weeks, then apply three times a week 30 g 12  . FOLIC ACID PO Take by mouth daily.    Javier Docker Oil 500 MG CAPS Take by mouth daily.    . Magnesium Carbonate Heavy POWD Take by mouth.    Marland Kitchen MAGNESIUM PO Take by mouth.    Marland Kitchen NIACIN PO Take by mouth.    Marland Kitchen omeprazole (PRILOSEC) 20 MG capsule Take 20 mg by mouth daily.    . Probiotic Product (PROBIOTIC DAILY PO) Take by mouth daily.    . SELENIUM PO Take 1 tablet by mouth daily.    . sertraline (ZOLOFT) 25 MG tablet Take 0.5 tablets (12.5 mg total) by mouth daily. For anxiety and depression. 45 tablet 1  .  Vitamin D-Vitamin K (K2 PLUS D3) [id number] MCG-UNIT TABS Take by mouth.     No current facility-administered medications on file prior to visit.   Review of Systems  Constitutional: Negative for chills, fever and malaise/fatigue.       Low grade temp  HENT: Positive for congestion and sore throat. Negative for ear pain and sinus pain.   Eyes: Negative for blurred vision, discharge and redness.  Respiratory: Positive for cough and sputum production. Negative for shortness of breath, wheezing and stridor.   Cardiovascular: Negative for chest pain, palpitations and leg swelling.  Gastrointestinal: Negative for abdominal pain, diarrhea, nausea and vomiting.  Musculoskeletal: Negative for myalgias.  Skin: Negative for rash.  Neurological: Positive for headaches. Negative for dizziness.     Observations/Objective: Patient appears well, in no distress Weight is baseline  No facial swelling or asymmetry Voice is mildly hoarse No obvious tremor or mobility impairment Moving neck and UEs normally Able to hear the call well  No cough or shortness of breath during interview  Talkative and mentally sharp with no cognitive changes No skin changes on face or neck , no rash or pallor Affect is normal    Assessment and Plan: Problem List Items Addressed This Visit      Respiratory   Viral URI with cough    Pt had covid test yesterday- PCR pending result (husband also)  She cannot take covid vaccine (h/o severe allergic rxn in family to vaccines) Disc treating symptoms as she has been (mucinex dm as directed)  Fluids/rest  Watch temp Watch for sob/wheeze (ER parameters discussed)  Will update            Follow Up Instructions: Drink fluids and rest  mucinex DM is good for cough and congestion  Nasal saline for congestion as needed  Tylenol for fever or pain or headache  Please alert [place] if symptoms worsen (if severe or short of breath please go to the ER)    We will see how covid  test turns out  Continue to isolate yourself    I discussed the assessment and treatment plan with the patient. The patient was provided an opportunity to ask questions and all were answered. The patient agreed with the plan and demonstrated an understanding of the instructions.   The patient was advised to call back or seek an in-person evaluation if the symptoms worsen or if the condition fails to improve as anticipated.    Loura Pardon, MD

## 2020-12-07 LAB — SARS-COV-2, NAA 2 DAY TAT

## 2020-12-07 LAB — NOVEL CORONAVIRUS, NAA: SARS-CoV-2, NAA: NOT DETECTED

## 2020-12-10 NOTE — Telephone Encounter (Signed)
Per chart review tab pt had video visit with Dr Glori Bickers on 12/06/20.

## 2021-01-21 ENCOUNTER — Other Ambulatory Visit: Payer: Self-pay | Admitting: Primary Care

## 2021-01-21 DIAGNOSIS — F331 Major depressive disorder, recurrent, moderate: Secondary | ICD-10-CM

## 2021-01-21 DIAGNOSIS — F411 Generalized anxiety disorder: Secondary | ICD-10-CM

## 2021-01-29 ENCOUNTER — Other Ambulatory Visit: Payer: Self-pay | Admitting: Primary Care

## 2021-01-29 DIAGNOSIS — F331 Major depressive disorder, recurrent, moderate: Secondary | ICD-10-CM

## 2021-01-29 DIAGNOSIS — F411 Generalized anxiety disorder: Secondary | ICD-10-CM

## 2021-03-18 DIAGNOSIS — D3617 Benign neoplasm of peripheral nerves and autonomic nervous system of trunk, unspecified: Secondary | ICD-10-CM | POA: Diagnosis not present

## 2021-03-18 DIAGNOSIS — D1801 Hemangioma of skin and subcutaneous tissue: Secondary | ICD-10-CM | POA: Diagnosis not present

## 2021-03-18 DIAGNOSIS — B070 Plantar wart: Secondary | ICD-10-CM | POA: Diagnosis not present

## 2021-03-18 DIAGNOSIS — L570 Actinic keratosis: Secondary | ICD-10-CM | POA: Diagnosis not present

## 2021-03-18 DIAGNOSIS — L245 Irritant contact dermatitis due to other chemical products: Secondary | ICD-10-CM | POA: Diagnosis not present

## 2021-03-18 DIAGNOSIS — L738 Other specified follicular disorders: Secondary | ICD-10-CM | POA: Diagnosis not present

## 2021-04-21 ENCOUNTER — Ambulatory Visit (INDEPENDENT_AMBULATORY_CARE_PROVIDER_SITE_OTHER): Payer: Medicare Other

## 2021-04-21 ENCOUNTER — Other Ambulatory Visit: Payer: Self-pay

## 2021-04-21 DIAGNOSIS — Z0000 Encounter for general adult medical examination without abnormal findings: Secondary | ICD-10-CM | POA: Diagnosis not present

## 2021-04-21 NOTE — Patient Instructions (Signed)
Kayla Gallegos , Thank you for taking time to come for your Medicare Wellness Visit. I appreciate your ongoing commitment to your health goals. Please review the following plan we discussed and let me know if I can assist you in the future.   Screening recommendations/referrals: Colonoscopy: Up to date, completed 03/14/2018, due 02/2023 Mammogram: Up to date, completed 05/13/2020, due 04/2021 Bone Density: Up to date, completed 05/13/2020, due 04/2022 Recommended yearly ophthalmology/optometry visit for glaucoma screening and checkup Recommended yearly dental visit for hygiene and checkup  Vaccinations: Influenza vaccine: due Fall 2022  Pneumococcal vaccine: declined  Tdap vaccine: Up to date, completed 05/22/2020, due 05/2030 Shingles vaccine: declined    Covid-19:declined   Advanced directives: Advance directive discussed with you today. Even though you declined this today please call our office should you change your mind and we can give you the proper paperwork for you to fill out.   Conditions/risks identified: hyperlipidemia   Next appointment: Follow up in one year for your annual wellness visit    Preventive Care 65 Years and Older, Female Preventive care refers to lifestyle choices and visits with your health care provider that can promote health and wellness. What does preventive care include?  A yearly physical exam. This is also called an annual well check.  Dental exams once or twice a year.  Routine eye exams. Ask your health care provider how often you should have your eyes checked.  Personal lifestyle choices, including:  Daily care of your teeth and gums.  Regular physical activity.  Eating a healthy diet.  Avoiding tobacco and drug use.  Limiting alcohol use.  Practicing safe sex.  Taking low-dose aspirin every day.  Taking vitamin and mineral supplements as recommended by your health care provider. What happens during an annual well check? The services and  screenings done by your health care provider during your annual well check will depend on your age, overall health, lifestyle risk factors, and family history of disease. Counseling  Your health care provider may ask you questions about your:  Alcohol use.  Tobacco use.  Drug use.  Emotional well-being.  Home and relationship well-being.  Sexual activity.  Eating habits.  History of falls.  Memory and ability to understand (cognition).  Work and work Statistician.  Reproductive health. Screening  You may have the following tests or measurements:  Height, weight, and BMI.  Blood pressure.  Lipid and cholesterol levels. These may be checked every 5 years, or more frequently if you are over 55 years old.  Skin check.  Lung cancer screening. You may have this screening every year starting at age 19 if you have a 30-pack-year history of smoking and currently smoke or have quit within the past 15 years.  Fecal occult blood test (FOBT) of the stool. You may have this test every year starting at age 54.  Flexible sigmoidoscopy or colonoscopy. You may have a sigmoidoscopy every 5 years or a colonoscopy every 10 years starting at age 36.  Hepatitis C blood test.  Hepatitis B blood test.  Sexually transmitted disease (STD) testing.  Diabetes screening. This is done by checking your blood sugar (glucose) after you have not eaten for a while (fasting). You may have this done every 1-3 years.  Bone density scan. This is done to screen for osteoporosis. You may have this done starting at age 84.  Mammogram. This may be done every 1-2 years. Talk to your health care provider about how often you should have regular  mammograms. Talk with your health care provider about your test results, treatment options, and if necessary, the need for more tests. Vaccines  Your health care provider may recommend certain vaccines, such as:  Influenza vaccine. This is recommended every  year.  Tetanus, diphtheria, and acellular pertussis (Tdap, Td) vaccine. You may need a Td booster every 10 years.  Zoster vaccine. You may need this after age 85.  Pneumococcal 13-valent conjugate (PCV13) vaccine. One dose is recommended after age 48.  Pneumococcal polysaccharide (PPSV23) vaccine. One dose is recommended after age 40. Talk to your health care provider about which screenings and vaccines you need and how often you need them. This information is not intended to replace advice given to you by your health care provider. Make sure you discuss any questions you have with your health care provider. Document Released: 11/29/2015 Document Revised: 07/22/2016 Document Reviewed: 09/03/2015 Elsevier Interactive Patient Education  2017 [place] Prevention in the Home Falls can cause injuries. They can happen to people of all ages. There are many things you can do to make your home safe and to help prevent falls. What can I do on the outside of my home?  Regularly fix the edges of walkways and driveways and fix any cracks.  Remove anything that might make you trip as you walk through a door, such as a raised step or threshold.  Trim any bushes or trees on the path to your home.  Use bright outdoor lighting.  Clear any walking paths of anything that might make someone trip, such as rocks or tools.  Regularly check to see if handrails are loose or broken. Make sure that both sides of any steps have handrails.  Any raised decks and porches should have guardrails on the edges.  Have any leaves, snow, or ice cleared regularly.  Use sand or salt on walking paths during winter.  Clean up any spills in your garage right away. This includes oil or grease spills. What can I do in the bathroom?  Use night lights.  Install grab bars by the toilet and in the tub and shower. Do not use towel bars as grab bars.  Use non-skid mats or decals in the tub or shower.  If you  need to sit down in the shower, use a plastic, non-slip stool.  Keep the floor dry. Clean up any water that spills on the floor as soon as it happens.  Remove soap buildup in the tub or shower regularly.  Attach bath mats securely with double-sided non-slip rug tape.  Do not have throw rugs and other things on the floor that can make you trip. What can I do in the bedroom?  Use night lights.  Make sure that you have a light by your bed that is easy to reach.  Do not use any sheets or blankets that are too big for your bed. They should not hang down onto the floor.  Have a firm chair that has side arms. You can use this for support while you get dressed.  Do not have throw rugs and other things on the floor that can make you trip. What can I do in the kitchen?  Clean up any spills right away.  Avoid walking on wet floors.  Keep items that you use a lot in easy-to-reach places.  If you need to reach something above you, use a strong step stool that has a grab bar.  Keep electrical cords out of the way.  Do not use floor polish or wax that makes floors slippery. If you must use wax, use non-skid floor wax.  Do not have throw rugs and other things on the floor that can make you trip. What can I do with my stairs?  Do not leave any items on the stairs.  Make sure that there are handrails on both sides of the stairs and use them. Fix handrails that are broken or loose. Make sure that handrails are as long as the stairways.  Check any carpeting to make sure that it is firmly attached to the stairs. Fix any carpet that is loose or worn.  Avoid having throw rugs at the top or bottom of the stairs. If you do have throw rugs, attach them to the floor with carpet tape.  Make sure that you have a light switch at the top of the stairs and the bottom of the stairs. If you do not have them, ask someone to add them for you. What else can I do to help prevent falls?  Wear shoes  that:  Do not have high heels.  Have rubber bottoms.  Are comfortable and fit you well.  Are closed at the toe. Do not wear sandals.  If you use a stepladder:  Make sure that it is fully opened. Do not climb a closed stepladder.  Make sure that both sides of the stepladder are locked into place.  Ask someone to hold it for you, if possible.  Clearly mark and make sure that you can see:  Any grab bars or handrails.  First and last steps.  Where the edge of each step is.  Use tools that help you move around (mobility aids) if they are needed. These include:  Canes.  Walkers.  Scooters.  Crutches.  Turn on the lights when you go into a dark area. Replace any light bulbs as soon as they burn out.  Set up your furniture so you have a clear path. Avoid moving your furniture around.  If any of your floors are uneven, fix them.  If there are any pets around you, be aware of where they are.  Review your medicines with your doctor. Some medicines can make you feel dizzy. This can increase your chance of falling. Ask your doctor what other things that you can do to help prevent falls. This information is not intended to replace advice given to you by your health care provider. Make sure you discuss any questions you have with your health care provider. Document Released: 08/29/2009 Document Revised: 04/09/2016 Document Reviewed: 12/07/2014 Elsevier Interactive Patient Education  2017 [employer].

## 2021-04-21 NOTE — Progress Notes (Signed)
PCP notes:  Health Maintenance: Declined all vaccines    Abnormal Screenings: None    Patient concerns: trouble sleeping,   No longer taking sertraline due to headaches   Nurse concerns: none   Next PCP appt.: none

## 2021-04-21 NOTE — Progress Notes (Signed)
Subjective:   Kayla Gallegos is a 70 y.o. female who presents for Medicare Annual (Subsequent) preventive examination.  Review of Systems: N/A      I connected with the patient today by telephone and verified that I am speaking with the correct person using two identifiers. Location patient: home Location nurse: work Persons participating in the telephone visit: patient, nurse.   I discussed the limitations, risks, security and privacy concerns of performing an evaluation and management service by telephone and the availability of in person appointments. I also discussed with the patient that there may be a patient responsible charge related to this service. The patient expressed understanding and verbally consented to this telephonic visit.        Cardiac Risk Factors include: advanced age (>47men, >6 women);Other (see comment), Risk factor comments: hyperlipidemia     Objective:    Today's Vitals   There is no height or weight on file to calculate BMI.  Advanced Directives 04/21/2021 03/11/2020 07/27/2018  Does Patient Have a Medical Advance Directive? No No No  Would patient like information on creating a medical advance directive? No - Patient declined No - Patient declined -    Current Medications (verified) Outpatient Encounter Medications as of 04/21/2021  Medication Sig  . ascorbic acid (VITAMIN C) 500 MG tablet Take by mouth daily.  . B Complex Vitamins (VITAMIN-B COMPLEX PO) Take by mouth daily.  . Cyanocobalamin (VITAMIN B 12 PO) Take by mouth.  . estradiol (ESTRACE) 0.1 MG/GM vaginal cream Apply 1 gram per vagina every night for 2 weeks, then apply three times a week  . FOLIC ACID PO Take by mouth daily.  Javier Docker Oil 500 MG CAPS Take by mouth daily.  . Magnesium Carbonate Heavy POWD Take by mouth.  Marland Kitchen MAGNESIUM PO Take by mouth.  Marland Kitchen NIACIN PO Take by mouth.  Marland Kitchen omeprazole (PRILOSEC) 20 MG capsule Take 20 mg by mouth daily.  . Probiotic Product (PROBIOTIC DAILY PO)  Take by mouth daily.  . SELENIUM PO Take 1 tablet by mouth daily.  . Vitamin D-Vitamin K (K2 PLUS D3) [phone] MCG-UNIT TABS Take by mouth.  . sertraline (ZOLOFT) 25 MG tablet TAKE 1/2 TABLET BY MOUTH ONCE DAILY FOR ANXIETY AND DEPRESSION (Patient not taking: Reported on 04/21/2021)   No facility-administered encounter medications on file as of 04/21/2021.    Allergies (verified) Codeine   History: Past Medical History:  Diagnosis Date  . Allergy   . Anxiety   . Cataract    bil cateracts removed  . GERD (gastroesophageal reflux disease)   . Hyperlipidemia   . Kidney stones   . Recurrent UTI (urinary tract infection)    Past Surgical History:  Procedure Laterality Date  . ABDOMINAL HYSTERECTOMY  1976  . APPENDECTOMY    . BACK SURGERY  08/2018   [facility]  . COLONOSCOPY    . LAPAROSCOPIC CHOLECYSTECTOMY  1990  . TONSILLECTOMY    . UPPER GASTROINTESTINAL ENDOSCOPY     Family History  Problem Relation Age of Onset  . Colon cancer Sister 8  . Colon cancer Brother 11  . Stomach cancer Neg Hx   . Esophageal cancer Neg Hx   . Rectal cancer Neg Hx    Social History   Socioeconomic History  . Marital status: Married    Spouse name: Not on file  . Number of children: Not on file  . Years of education: Not on file  . Highest education level: Not on  file  Occupational History  . Not on file  Tobacco Use  . Smoking status: Never Smoker  . Smokeless tobacco: Never Used  Vaping Use  . Vaping Use: Never used  Substance and Sexual Activity  . Alcohol use: No  . Drug use: No  . Sexual activity: Not on file  Other Topics Concern  . Not on file  Social History Narrative  . Not on file   Social Determinants of Health   Financial Resource Strain: Low Risk   . Difficulty of Paying Living Expenses: Not hard at all  Food Insecurity: No Food Insecurity  . Worried About Charity fundraiser in the Last Year: Never true  . Ran Out of Food in the Last Year: Never true   Transportation Needs: No Transportation Needs  . Lack of Transportation (Medical): No  . Lack of Transportation (Non-Medical): No  Physical Activity: Sufficiently Active  . Days of Exercise per Week: 7 days  . Minutes of Exercise per Session: 60 min  Stress: Stress Concern Present  . Feeling of Stress : To some extent  Social Connections: Not on file    Tobacco Counseling Counseling given: Not Answered   Clinical Intake:  Pre-visit preparation completed: Yes  Pain : No/denies pain     Nutritional Risks: None Diabetes: No  How often do you need to have someone help you when you read instructions, pamphlets, or other written materials from your doctor or pharmacy?: 1 - Never  Diabetic: No Nutrition Risk Assessment:  Has the patient had any N/V/D within the last 2 months?  No  Does the patient have any non-healing wounds?  No  Has the patient had any unintentional weight loss or weight gain?  No   Diabetes:  Is the patient diabetic?  No  If diabetic, was a CBG obtained today?  N/A Did the patient bring in their glucometer from home?  N/A How often do you monitor your CBG's? N/A.   Financial Strains and Diabetes Management:  Are you having any financial strains with the device, your supplies or your medication? N/A.  Does the patient want to be seen by Chronic Care Management for management of their diabetes?  N/A Would the patient like to be referred to a Nutritionist or for Diabetic Management?  N/A  Interpreter Needed?: No  Information entered by :: CJohnson, LPN   Activities of Daily Living In your present state of health, do you have any difficulty performing the following activities: 04/21/2021  Hearing? N  Vision? N  Difficulty concentrating or making decisions? N  Walking or climbing stairs? N  Dressing or bathing? N  Doing errands, shopping? N  Preparing Food and eating ? N  Using the Toilet? N  In the past six months, have you accidently leaked  urine? N  Do you have problems with loss of bowel control? N  Managing your Medications? N  Managing your Finances? N  Housekeeping or managing your Housekeeping? N  Some recent data might be hidden    Patient Care Team: Pleas Koch, NP as PCP - General (Internal Medicine)  Indicate any recent Medical Services you may have received from other than [facility] providers in the past year (date may be approximate).     Assessment:   This is a routine wellness examination for Alfrieda.  Hearing/Vision screen  Hearing Screening   125Hz  250Hz  500Hz  1000Hz  2000Hz  3000Hz  4000Hz  6000Hz  8000Hz   Right ear:  Left ear:           Vision Screening Comments: Patient gets annual eye exams   Dietary issues and exercise activities discussed: Current Exercise Habits: Home exercise routine, Type of exercise: walking, Time (Minutes): 60, Frequency (Times/Week): 7, Weekly Exercise (Minutes/Week): 420, Intensity: Moderate, Exercise limited by: None identified  Goals Addressed            This Visit's Progress   . Patient Stated       04/21/2021, I will continue to walk 40,000 steps daily.       Depression Screen PHQ 2/9 Scores 04/21/2021 08/14/2020 04/05/2020 03/11/2020  PHQ - 2 Score 2 3 4 4   PHQ- 9 Score 3 3 12 4     Fall Risk Fall Risk  04/21/2021 03/11/2020 10/11/2019 06/19/2019 01/21/2018  Falls in the past year? 0 0 0 (No Data) No  Comment - - Emmi Telephone Survey: data to providers prior to load Emmi Telephone Survey: data to providers prior to load -  Number falls in past yr: 0 0 - (No Data) -  Comment - - - Emmi Telephone Survey Actual Response =  -  Injury with Fall? 0 0 - - -  Risk for fall due to : Medication side effect No Fall Risks - - -  Follow up Falls evaluation completed;Falls prevention discussed Falls evaluation completed;Falls prevention discussed - - -    FALL RISK PREVENTION PERTAINING TO THE HOME:  Any stairs in or around the home? Yes  If so, are there any without  handrails? No  Home free of loose throw rugs in walkways, pet beds, electrical cords, etc? Yes  Adequate lighting in your home to reduce risk of falls? Yes   ASSISTIVE DEVICES UTILIZED TO PREVENT FALLS:  Life alert? No  Use of a cane, walker or w/c? No  Grab bars in the bathroom? No  Shower chair or bench in shower? No  Elevated toilet seat or a handicapped toilet? No   TIMED UP AND GO:  Was the test performed? N/A telephone visit .    Cognitive Function: MMSE - Mini Mental State Exam 04/21/2021 03/11/2020  Not completed: Refused -  Orientation to time - 5  Orientation to Place - 5  Registration - 3  Attention/ Calculation - 5  Recall - 3  Language- repeat - 1  Mini Cog  Mini-Cog screen was not completed. Patient refused. Maximum score is 22. A value of 0 denotes this part of the MMSE was not completed or the patient failed this part of the Mini-Cog screening.       Immunizations Immunization History  Administered Date(s) Administered  . Tdap 05/22/2020    TDAP status: Up to date  Flu Vaccine status: due Fall 2022  Pneumococcal vaccine status: Declined,  Education has been provided regarding the importance of this vaccine but patient still declined. Advised may receive this vaccine at local pharmacy or Health Dept. Aware to provide a copy of the vaccination record if obtained from local pharmacy or Health Dept. Verbalized acceptance and understanding.   Covid-19 vaccine status: Declined, Education has been provided regarding the importance of this vaccine but patient still declined. Advised may receive this vaccine at local pharmacy or Health Dept.or vaccine clinic. Aware to provide a copy of the vaccination record if obtained from local pharmacy or Health Dept. Verbalized acceptance and understanding.  Qualifies for Shingles Vaccine? Yes   Zostavax completed No   Shingrix Completed?: No.    Education has been  provided regarding the importance of this vaccine. Patient  has been advised to call insurance company to determine out of pocket expense if they have not yet received this vaccine. Advised may also receive vaccine at local pharmacy or Health Dept. Verbalized acceptance and understanding.  Screening Tests Health Maintenance  Topic Date Due  . COVID-19 Vaccine (1) 05/07/2022 (Originally 07/09/1956)  . Zoster Vaccines- Shingrix (1 of 2) 07/22/2022 (Originally 07/08/2000)  . PNA vac Low Risk Adult (1 of 2 - PCV13) 03/11/2024 (Originally 07/09/2015)  . INFLUENZA VACCINE  06/16/2021  . MAMMOGRAM  05/13/2022  . COLONOSCOPY (Pts 45-43yrs Insurance coverage will need to be confirmed)  03/15/2023  . TETANUS/TDAP  05/22/2030  . DEXA SCAN  Completed  . Hepatitis C Screening  Completed  . Pneumococcal Vaccine 33-109 Years old  Aged Out  . HPV VACCINES  Aged Out    Health Maintenance  There are no preventive care reminders to display for this patient.  Colorectal cancer screening: Type of screening: Colonoscopy. Completed 03/14/2018. Repeat every 5 years  Mammogram status: Completed 05/13/2020. Repeat every year  Bone Density status: Completed 05/13/2020. Results reflect: Bone density results: OSTEOPENIA. Repeat every 2 years.  Lung Cancer Screening: (Low Dose CT Chest recommended if Age 39-80 years, 30 pack-year currently smoking OR have quit w/in 15years.) does not qualify.  Additional Screening:  Hepatitis C Screening: does qualify; Completed 09/30/2016  Vision Screening: Recommended annual ophthalmology exams for early detection of glaucoma and other disorders of the eye. Is the patient up to date with their annual eye exam?  Yes  Who is the provider or what is the name of the office in which the patient attends annual eye exams? Dr. Rick Duff If pt is not established with a provider, would they like to be referred to a provider to establish care? No .   Dental Screening: Recommended annual dental exams for proper oral hygiene  Community Resource  Referral / Chronic Care Management: CRR required this visit?  No   CCM required this visit?  No      Plan:     I have personally reviewed and noted the following in the patient's chart:   . Medical and social history . Use of alcohol, tobacco or illicit drugs  . Current medications and supplements including opioid prescriptions.  . Functional ability and status . Nutritional status . Physical activity . Advanced directives . List of other physicians . Hospitalizations, surgeries, and ER visits in previous 12 months . Vitals . Screenings to include cognitive, depression, and falls . Referrals and appointments  In addition, I have reviewed and discussed with patient certain preventive protocols, quality metrics, and best practice recommendations. A written personalized care plan for preventive services as well as general preventive health recommendations were provided to patient.   Due to this being a telephonic visit, the after visit summary with patients personalized plan was offered to patient via office or my-chart. Patient preferred to pick up at office at next visit or via mychart.   Andrez Grime, LPN   07/23/6733

## 2021-04-24 ENCOUNTER — Other Ambulatory Visit: Payer: Self-pay | Admitting: Primary Care

## 2021-04-24 DIAGNOSIS — F411 Generalized anxiety disorder: Secondary | ICD-10-CM

## 2021-04-24 DIAGNOSIS — F331 Major depressive disorder, recurrent, moderate: Secondary | ICD-10-CM

## 2021-04-24 NOTE — Telephone Encounter (Signed)
Office visit required in July for further refills.

## 2021-04-30 ENCOUNTER — Telehealth: Payer: Self-pay

## 2021-04-30 NOTE — Telephone Encounter (Signed)
Noted. Have her notify us if she develops increased pain, particularly to the calf itself, redness/swelling/warmth.  Otherwise will see her as scheduled.

## 2021-04-30 NOTE — Telephone Encounter (Signed)
Sounds like she needs an office visit. Will await triage assessment.

## 2021-04-30 NOTE — Telephone Encounter (Signed)
[facility] Day - Client TELEPHONE ADVICE RECORD AccessNurse Patient Name: Tamsen Meek A PPLE Gender: Female DOB: 1951/01/26 Age: 70 Y 9 M 23 D Return Phone Number: [phone] (Primary) Address: City/ State/ Zip: [place] [facility] - Client Client Site [place] - Day Physician Alma Friendly - NP Contact Type Call Who Is Calling Patient / Member / Family / Caregiver Call Type Triage / Clinical Relationship To Patient Self Return Phone Number [phone] (Primary) Chief Complaint Leg Pain Reason for Call Symptomatic / Request for [place] states she is having calf pain x1 day. Translation No Nurse Assessment Nurse: Laqueta Due, RN, Metallurgist (Eastern Time): 04/30/2021 3:34:12 PM Confirm and document reason for call. If symptomatic, describe symptoms. ---calf pain-right below the calf x1 day-on one leg only. Does the patient have any new or worsening symptoms? ---Yes Will a triage be completed? ---Yes Related visit to physician within the last 2 weeks? ---N/A Does the PT have any chronic conditions? (i.e. diabetes, asthma, this includes High risk factors for pregnancy, etc.) ---Unknown Is this a behavioral health or substance abuse call? ---No Guidelines Guideline Title Affirmed Question Affirmed Notes Nurse Date/Time Eilene Ghazi Time) Leg Pain Leg pain Laqueta Due, RN, [employer] 04/30/2021 3:34:18 PM Disp. Time Eilene Ghazi Time) Disposition Final User 04/30/2021 3:47:38 [place], RN, Management consultant Understands Yes PreDisposition Did not know what to do PLEASE NOTE: All timestamps contained within this report are represented as [place] Standard Time. CONFIDENTIALTY NOTICE: This fax transmission is intended only for the addressee. It contains information that is legally privileged, confidential  or otherwise protected from use or disclosure. If you are not the intended recipient, you are strictly prohibited from reviewing, disclosing, copying using or disseminating any of this information or taking any action in reliance on or regarding this information. If you have received this fax in error, please notify us immediately by telephone so that we can arrange for its return to [place]. Phone: [phone], Toll-Free: [phone], Fax: [phone] Page: 2 of 2 Call Id: [id number] Care Advice Given Per Guideline HOME CARE: * You should be able to treat this at home. * Moderate pain (e.g., limping) lasts over 3 days CALL BACK IF: * Signs of infection occur (e.g., spreading redness, warmth, fever) * You become worse CARE ADVICE given per Leg Pain (Adult) guideline. PAIN MEDICINES: * For pain relief, you can take either acetaminophen, ibuprofen, or naproxen. Comments User: Letta Moynahan, RN Date/Time Eilene Ghazi Time): 04/30/2021 3:35:47 PM pain comes and goes User: Letta Moynahan, RN Date/Time (Eastern Time): 04/30/2021 3:50:48 PM advised to try drinking powerade for electrolytes, increase water intake due to increased heat wave, try walking around when having the pain, advised can try heat to the area. [email or web address]- stop-leg-muscle-cramps

## 2021-04-30 NOTE — Telephone Encounter (Signed)
Patient called states she is having new onset of leg pain x 1 day. Very uncomfortable. Denies any injury, redness or swelling. I have transferred patient to access nurse for triage

## 2021-04-30 NOTE — Telephone Encounter (Signed)
Patient states that she is no longer taking medication due to it causing headaches. Is scheduled to come in next Friday to discuss other options.

## 2021-05-01 NOTE — Telephone Encounter (Signed)
Called patient reviewed all information and repeated back to me. Will call if any questions.  ? ?

## 2021-05-07 ENCOUNTER — Other Ambulatory Visit (INDEPENDENT_AMBULATORY_CARE_PROVIDER_SITE_OTHER): Payer: Medicare Other

## 2021-05-07 ENCOUNTER — Other Ambulatory Visit: Payer: Self-pay

## 2021-05-07 ENCOUNTER — Telehealth: Payer: Self-pay | Admitting: *Deleted

## 2021-05-07 DIAGNOSIS — R350 Frequency of micturition: Secondary | ICD-10-CM | POA: Diagnosis not present

## 2021-05-07 LAB — POC URINALSYSI DIPSTICK (AUTOMATED)
Bilirubin, UA: NEGATIVE
Glucose, UA: NEGATIVE
Ketones, UA: NEGATIVE
Leukocytes, UA: NEGATIVE
Nitrite, UA: NEGATIVE
Protein, UA: NEGATIVE
Spec Grav, UA: 1.01 (ref 1.010–1.025)
Urobilinogen, UA: 0.2 E.U./dL
pH, UA: 7.5 (ref 5.0–8.0)

## 2021-05-07 NOTE — Telephone Encounter (Signed)
I am okay if she wants to come to our office to provide [place] with a clean-catch urine, we will also need to collect a culture.  She will need to keep her appointment scheduled for this Friday.  I will order both.

## 2021-05-07 NOTE — Telephone Encounter (Signed)
I called the patient and scheduled a lab appointment for a urine sample for today.  [name],cma

## 2021-05-07 NOTE — Telephone Encounter (Signed)
Patient called stating that she started about 3 weeks ago with UTI symptoms so she took some AZO. Patient stated that the AZO helped and the symptoms got better. Patient stated about 2 days ago she stated back with urine frequency and low back pain. Patient stated that she has started back on AZO which helps some. Patient denies a fever, burning or pain. Patient stated that she has an appointment with Kayla Bossier NP Friday but was wanting to know if Kayla Gallegos would allow her to come in and just do a UA today. Patient was advised that our providers usually require an office visit to treat UTI's. Patient requested that message go back to [place]. There are no appointments available today this at this office. Please reach out to patient.

## 2021-05-07 NOTE — Telephone Encounter (Signed)
[facility] Night - Client Nonclinical Telephone Record [facility] Client [facility] Night - Client Client Site [place] - Night Physician Alma Friendly - NP Contact Type Call Who Is Calling Patient / Member / Family / Caregiver Caller Name Kayla Gallegos Phone Number [phone] Patient Name Kayla Gallegos Patient DOB 1951/01/23 Call Type Message Only Information Provided Reason for Call Request for General Office Information Initial Comment Caller states has a UTI, she has an appointment on Friday and would like to have urine sample tested today. Additional Comment Declined triage, provided office hours. Disp. Time Disposition Final User 05/07/2021 7:42:35 AM General Information Provided Yes Allena Napoleon Call Closed By: Allena Napoleon Transaction Date/Time: 05/07/2021 7:38:58 AM (ET)

## 2021-05-07 NOTE — Addendum Note (Signed)
Addended by: Francella Solian on: 05/07/2021 04:11 PM   Modules accepted: Orders

## 2021-05-07 NOTE — Telephone Encounter (Signed)
Please see note below access nurse note. 

## 2021-05-08 LAB — URINE CULTURE
MICRO NUMBER:: 12037411
Result:: NO GROWTH
SPECIMEN QUALITY:: ADEQUATE

## 2021-05-09 ENCOUNTER — Other Ambulatory Visit: Payer: Self-pay

## 2021-05-09 ENCOUNTER — Other Ambulatory Visit: Payer: Self-pay | Admitting: Primary Care

## 2021-05-09 ENCOUNTER — Ambulatory Visit (INDEPENDENT_AMBULATORY_CARE_PROVIDER_SITE_OTHER): Payer: Medicare Other | Admitting: Primary Care

## 2021-05-09 ENCOUNTER — Encounter: Payer: Self-pay | Admitting: Primary Care

## 2021-05-09 VITALS — BP 120/64 | HR 81 | Temp 98.3°F | Ht 63.0 in | Wt 139.0 lb

## 2021-05-09 DIAGNOSIS — R739 Hyperglycemia, unspecified: Secondary | ICD-10-CM

## 2021-05-09 DIAGNOSIS — F411 Generalized anxiety disorder: Secondary | ICD-10-CM

## 2021-05-09 DIAGNOSIS — N898 Other specified noninflammatory disorders of vagina: Secondary | ICD-10-CM | POA: Diagnosis not present

## 2021-05-09 DIAGNOSIS — E785 Hyperlipidemia, unspecified: Secondary | ICD-10-CM | POA: Diagnosis not present

## 2021-05-09 DIAGNOSIS — Z87898 Personal history of other specified conditions: Secondary | ICD-10-CM | POA: Diagnosis not present

## 2021-05-09 DIAGNOSIS — K219 Gastro-esophageal reflux disease without esophagitis: Secondary | ICD-10-CM

## 2021-05-09 DIAGNOSIS — F331 Major depressive disorder, recurrent, moderate: Secondary | ICD-10-CM

## 2021-05-09 LAB — LIPID PANEL
Cholesterol: 271 mg/dL — ABNORMAL HIGH (ref 0–200)
HDL: 43 mg/dL (ref >39.00)
LDL Cholesterol: 200 mg/dL — ABNORMAL HIGH (ref 0–99)
NonHDL: 228.2
Total CHOL/HDL Ratio: 6
Triglycerides: 141 mg/dL (ref 0.0–149.0)
VLDL: 28.2 mg/dL (ref 0.0–40.0)

## 2021-05-09 LAB — HEMOGLOBIN A1C: Hgb A1c MFr Bld: 5.8 % (ref 4.6–6.5)

## 2021-05-09 LAB — COMPREHENSIVE METABOLIC PANEL
ALT: 19 U/L (ref 0–35)
AST: 21 U/L (ref 0–37)
Albumin: 4.5 g/dL (ref 3.5–5.2)
Alkaline Phosphatase: 70 U/L (ref 39–117)
BUN: 10 mg/dL (ref 6–23)
CO2: 30 mEq/L (ref 19–32)
Calcium: 10.3 mg/dL (ref 8.4–10.5)
Chloride: 101 mEq/L (ref 96–112)
Creatinine, Ser: 0.85 mg/dL (ref 0.40–1.20)
GFR: 69.21 mL/min (ref >60.00)
Glucose, Bld: 91 mg/dL (ref 70–99)
Potassium: 4.7 mEq/L (ref 3.5–5.1)
Sodium: 139 mEq/L (ref 135–145)
Total Bilirubin: 0.6 mg/dL (ref 0.2–1.2)
Total Protein: 7.2 g/dL (ref 6.0–8.3)

## 2021-05-09 LAB — CBC
HCT: 41.3 % (ref 36.0–46.0)
Hemoglobin: 14.2 g/dL (ref 12.0–15.0)
MCHC: 34.5 g/dL (ref 30.0–36.0)
MCV: 88 fl (ref 78.0–100.0)
Platelets: 228 10*3/uL (ref 150.0–400.0)
RBC: 4.69 Mil/uL (ref 3.87–5.11)
RDW: 13.6 % (ref 11.5–15.5)
WBC: 6.3 10*3/uL (ref 4.0–10.5)

## 2021-05-09 MED ORDER — MECLIZINE HCL 25 MG PO TABS: 25.0000 mg | ORAL_TABLET | Freq: Three times a day (TID) | ORAL | 0 refills | Status: DC | PRN

## 2021-05-09 MED ORDER — ESCITALOPRAM OXALATE 5 MG PO TABS: 5.0000 mg | ORAL_TABLET | Freq: Every day | ORAL | 0 refills | Status: DC

## 2021-05-09 NOTE — Assessment & Plan Note (Signed)
Long history, is needing supply of PRN meclizine for which she takes infrequently. Refill provided.

## 2021-05-09 NOTE — Assessment & Plan Note (Signed)
Denies concerns today, is not on any treatment.

## 2021-05-09 NOTE — Assessment & Plan Note (Signed)
No longer on Zoloft due to headaches.   Continues to struggle with anxiety and difficulty sleeping. She would like to try another option, will trial Lexapro 5 mg. She will update.

## 2021-05-09 NOTE — Progress Notes (Signed)
Subjective:    Patient ID: Kayla Gallegos, female    DOB: [birth year], 70 y.o.   MRN: [id number]  HPI  Kayla Gallegos is a very pleasant 70 y.o. female with a history of GAD, hyperlipidemia, recurrent UTI, MDD who presents today for follow up of chronic conditions.  History of vertigo, previously managed on Meclizine with improvement. She is needing an updated prescription.   She stopped Zoloft due to headaches which stopped when she stopped taking Zoloft. She continues to struggle with anxiety and would like to try something different. She will also need something PRN to take when she travels to see family this year. She will call when ready.  She called a few days ago with symptoms of dysuria and suprapubic pain. History of kidney stones, this felt like one. UA with trace blood, negative urine culture. She believes that she passed it last night.   BP Readings from Last 3 Encounters:  05/09/21 120/64  11/27/20 (!) 146/77  08/23/20 133/76     Immunizations: -Tetanus: 2021 -Influenza: Has not completed -Covid-19: Has not completed -Shingles: Declines -Pneumonia: Declines  Mammogram: Completed in June 2021, declines today. Dexa: Completed in June 2021, taking vitamin D and calcium  Colonoscopy: Completed in 2019, due 2024      Review of Systems  Respiratory:  Negative for shortness of breath.   Cardiovascular:  Negative for chest pain.  Genitourinary:  Negative for difficulty urinating and hematuria.  Psychiatric/Behavioral:  Positive for sleep disturbance. The patient is nervous/anxious.         Past Medical History:  Diagnosis Date   Allergy    Anxiety    Cataract    bil cateracts removed   GERD (gastroesophageal reflux disease)    Hyperlipidemia    Kidney stones    Recurrent UTI (urinary tract infection)     Social History   Socioeconomic History   Marital status: Married    Spouse name: Not on file   Number of children: Not on file   Years of  education: Not on file   Highest education level: Not on file  Occupational History   Not on file  Tobacco Use   Smoking status: Never   Smokeless tobacco: Never  Vaping Use   Vaping Use: Never used  Substance and Sexual Activity   Alcohol use: No   Drug use: No   Sexual activity: Not on file  Other Topics Concern   Not on file  Social History Narrative   Not on file   Social Determinants of Health   Financial Resource Strain: Low Risk    Difficulty of Paying Living Expenses: Not hard at all  Food Insecurity: No Food Insecurity   Worried About Charity fundraiser in the Last Year: Never true   Ran Out of Food in the Last Year: Never true  Transportation Needs: No Transportation Needs   Lack of Transportation (Medical): No   Lack of Transportation (Non-Medical): No  Physical Activity: Sufficiently Active   Days of Exercise per Week: 7 days   Minutes of Exercise per Session: 60 min  Stress: Stress Concern Present   Feeling of Stress : To some extent  Social Connections: Not on file  Intimate Partner Violence: Not At Risk   Fear of Current or Ex-Partner: No   Emotionally Abused: No   Physically Abused: No   Sexually Abused: No    Past Surgical History:  Procedure Laterality Date   ABDOMINAL HYSTERECTOMY  1976  APPENDECTOMY     BACK SURGERY  08/2018   [facility]   COLONOSCOPY     LAPAROSCOPIC CHOLECYSTECTOMY  1990   TONSILLECTOMY     UPPER GASTROINTESTINAL ENDOSCOPY      Family History  Problem Relation Age of Onset   Colon cancer Sister 31   Colon cancer Brother 28   Stomach cancer Neg Hx    Esophageal cancer Neg Hx    Rectal cancer Neg Hx     Allergies  Allergen Reactions   Codeine Nausea And Vomiting and Nausea Only    dizziness    Current Outpatient Medications on File Prior to Visit  Medication Sig Dispense Refill   Acetylcysteine (NAC 600 PO) Take by mouth.     ascorbic acid (VITAMIN C) 500 MG tablet Take by mouth daily.     B Complex  Vitamins (VITAMIN-B COMPLEX PO) Take by mouth daily.     Cyanocobalamin (VITAMIN B 12 PO) Take by mouth.     FOLIC ACID PO Take by mouth daily.     Krill Oil 500 MG CAPS Take by mouth daily.     Magnesium Carbonate Heavy POWD Take by mouth.     MAGNESIUM PO Take by mouth.     NIACIN PO Take by mouth.     Probiotic Product (PROBIOTIC DAILY PO) Take by mouth daily.     SELENIUM PO Take 1 tablet by mouth daily.     Succinylcholine Chloride (QUELICIN IJ) Inject as directed.     Vitamin D-Vitamin K (K2 PLUS D3) [phone] MCG-UNIT TABS Take by mouth.     estradiol (ESTRACE) 0.1 MG/GM vaginal cream Apply 1 gram per vagina every night for 2 weeks, then apply three times a week (Patient not taking: Reported on 05/09/2021) 30 g 12   omeprazole (PRILOSEC) 20 MG capsule Take 20 mg by mouth daily. (Patient not taking: Reported on 05/09/2021)     sertraline (ZOLOFT) 25 MG tablet TAKE 1/2 TABLET BY MOUTH ONCE DAILY FOR ANXIETY AND DEPRESSION (Patient not taking: Reported on 05/09/2021) 15 tablet 0   No current facility-administered medications on file prior to visit.    BP 120/64   Pulse 81   Temp 98.3 F (36.8 C) (Temporal)   Ht 5\' 3"  (1.6 m)   Wt 139 lb (63 kg)   SpO2 96%   BMI 24.62 kg/m  Objective:   Physical Exam Cardiovascular:     Rate and Rhythm: Normal rate and regular rhythm.  Pulmonary:     Effort: Pulmonary effort is normal.     Breath sounds: Normal breath sounds.  Musculoskeletal:     Cervical back: Neck supple.  Skin:    General: Skin is warm and dry.  Psychiatric:        Mood and Affect: Mood normal.          Assessment & Plan:      This visit occurred during the SARS-CoV-2 public health emergency.  Safety protocols were in place, including screening questions prior to the visit, additional usage of staff PPE, and extensive cleaning of exam room while observing appropriate contact time as indicated for disinfecting solutions.

## 2021-05-09 NOTE — Patient Instructions (Signed)
Stop by the lab prior to leaving today. I will notify you of your results once received.   Start escitalopram (Lexapro) 5 mg once daily for anxiety. Please update me via My Chart in 3-4 weeks.  It was a pleasure to see you today!

## 2021-05-09 NOTE — Assessment & Plan Note (Signed)
Discussed the importance of a healthy diet and regular exercise in order for weight loss, and to reduce the risk of further co-morbidity.  Repeat lipid panel pending. 

## 2021-05-09 NOTE — Assessment & Plan Note (Signed)
Denies concerns for depression, mostly just anxiety. Will be initiating Lexapro 5 mg. She will update. See notes under GAD.

## 2021-05-09 NOTE — Assessment & Plan Note (Signed)
Using Estrace cream PRN, doing well. Continue same.

## 2021-05-12 ENCOUNTER — Other Ambulatory Visit: Payer: Self-pay | Admitting: Primary Care

## 2021-05-12 DIAGNOSIS — F331 Major depressive disorder, recurrent, moderate: Secondary | ICD-10-CM

## 2021-05-12 DIAGNOSIS — F411 Generalized anxiety disorder: Secondary | ICD-10-CM

## 2021-05-12 MED ORDER — ESCITALOPRAM OXALATE 5 MG PO TABS: 5.0000 mg | ORAL_TABLET | Freq: Every day | ORAL | 0 refills | Status: DC

## 2021-05-12 NOTE — Telephone Encounter (Signed)
I called [facility] and left a message to cancel the rx that was sent 05-09-21 and sent rx to [facility] as it was sent to [facility] per Anda Kraft 05-09-21.

## 2021-07-03 NOTE — Telephone Encounter (Signed)
Just want to let you know I tested positive for covid on Saturday. I started with a bad headache on Friday around 4pm, test Friday night was nagative. I woke up Saturday morning with my whole body hurting.  On Sunday was feeling much better, never lost my taste or smell, no appetite. I tested again yesterday, still positive, I am now out of test. Will try and have a friend get me a couple more. My question is how long after testing nagative I am still contagious? Is they anything I should be doing or taking? Thanks, hope you're doing well.  Above is pt message. I spoke with pt; pt started with symptoms of H/A on 06/26/21 and body aches pt tested covid 06/28/21. Now pt does not have fever. Today oxygen level is 99% on room air. Pt continues with dry cough on and off. Some sinus congestion.Pt [place] and got SOB.No CP and no diarrhea or vomiting. No S/T or runny nose, no loss of taste or smell. No distress with breathing. Pt is feeling tired and does not have appetite. Pt does not want to go to UC. Pt will continue to self quarantine, drink plenty of fluid, rest and take tylenol ilf needed. Pt scheduled video visit with Dr Diona Browner on 07/04/21 at 10 AM.pt will have vitals ready when [place] calls. UC & ED precatuions given and pt voiced understanding. Sending note to Dr Dionicio Stall CMA and Gentry Fitz NP as PCP who is out of office.

## 2021-07-04 ENCOUNTER — Encounter: Payer: Self-pay | Admitting: Family Medicine

## 2021-07-04 ENCOUNTER — Telehealth: Payer: Medicare Other | Admitting: Family Medicine

## 2021-07-04 VITALS — BP 128/67 | HR 72

## 2021-07-08 NOTE — Telephone Encounter (Signed)
She will need an appointment.  We can add her on at 3:20 pm today if she's available.

## 2021-07-08 NOTE — Telephone Encounter (Signed)
Called patient set up with [facility] for tomorrow. Will call If any changes.

## 2021-07-08 NOTE — Telephone Encounter (Signed)
Can she come in office or does she need to be seen at urgent care?

## 2021-07-09 ENCOUNTER — Ambulatory Visit (INDEPENDENT_AMBULATORY_CARE_PROVIDER_SITE_OTHER): Payer: Medicare Other | Admitting: Nurse Practitioner

## 2021-07-09 ENCOUNTER — Encounter: Payer: Self-pay | Admitting: Nurse Practitioner

## 2021-07-09 ENCOUNTER — Other Ambulatory Visit: Payer: Self-pay

## 2021-07-09 VITALS — BP 130/76 | HR 78 | Temp 98.3°F | Resp 12 | Ht 63.0 in | Wt 134.5 lb

## 2021-07-09 DIAGNOSIS — R350 Frequency of micturition: Secondary | ICD-10-CM | POA: Diagnosis not present

## 2021-07-09 LAB — POCT URINALYSIS DIP (CLINITEK)
Bilirubin, UA: NEGATIVE
Blood, UA: NEGATIVE
Glucose, UA: NEGATIVE mg/dL
Ketones, POC UA: NEGATIVE mg/dL
Nitrite, UA: NEGATIVE
POC PROTEIN,UA: NEGATIVE
Spec Grav, UA: 1.01 (ref 1.010–1.025)
Urobilinogen, UA: 0.2 E.U./dL
pH, UA: 7 (ref 5.0–8.0)

## 2021-07-09 NOTE — Patient Instructions (Signed)
Nice to see you today.  I will be in touch with the lab results. Follow up as needed.

## 2021-07-09 NOTE — Progress Notes (Signed)
Acute Office Visit  Subjective:    Patient ID: Kayla Gallegos, female    DOB: Mar 10, 1951, 70 y.o.   MRN: [id number]  Chief Complaint  Patient presents with   Urinary Frequency    Urgency, pelvic pressure at night time, wakes up with having to urinate. She has recurrent issues with UTIs per patient.    HPI Patient is in today for urinary complaints. Followed by urology in [place]. Because she is having recurrent. States that she has stones.  Frequency. States she feels like the bladder is going to pop open. Mostly at night. States she does go to the restroom during the day but has a good intake of fluids throughout the day. Has been using AZO OTC intermittently and not sure if that has helped or not. She has been on two rounds of amoxicillin along with having covid over the past 2-3 weeks.  Past Medical History:  Diagnosis Date   Allergy    Anxiety    Cataract    bil cateracts removed   DIVERTICULOSIS, COLON 05/29/2008   Qualifier: Diagnosis of  By: Jenny Reichmann MD, Hunt Oris    GERD (gastroesophageal reflux disease)    Hyperlipidemia    Kidney stones    PARESTHESIA 04/01/2009   Qualifier: Diagnosis of  By: Jenny Reichmann MD, Hunt Oris    Recurrent UTI (urinary tract infection)     Past Surgical History:  Procedure Laterality Date   ABDOMINAL HYSTERECTOMY  1976   APPENDECTOMY     BACK SURGERY  08/2018   [facility]   COLONOSCOPY     LAPAROSCOPIC CHOLECYSTECTOMY  1990   TONSILLECTOMY     UPPER GASTROINTESTINAL ENDOSCOPY      Family History  Problem Relation Age of Onset   Colon cancer Sister 80   Colon cancer Brother 19   Stomach cancer Neg Hx    Esophageal cancer Neg Hx    Rectal cancer Neg Hx     Social History   Socioeconomic History   Marital status: Married    Spouse name: Not on file   Number of children: Not on file   Years of education: Not on file   Highest education level: Not on file  Occupational History   Not on file  Tobacco Use   Smoking status: Never    Smokeless tobacco: Never  Vaping Use   Vaping Use: Never used  Substance and Sexual Activity   Alcohol use: No   Drug use: No   Sexual activity: Not on file  Other Topics Concern   Not on file  Social History Narrative   Not on file   Social Determinants of Health   Financial Resource Strain: Low Risk    Difficulty of Paying Living Expenses: Not hard at all  Food Insecurity: No Food Insecurity   Worried About Charity fundraiser in the Last Year: Never true   Ran Out of Food in the Last Year: Never true  Transportation Needs: No Transportation Needs   Lack of Transportation (Medical): No   Lack of Transportation (Non-Medical): No  Physical Activity: Sufficiently Active   Days of Exercise per Week: 7 days   Minutes of Exercise per Session: 60 min  Stress: Stress Concern Present   Feeling of Stress : To some extent  Social Connections: Not on file  Intimate Partner Violence: Not At Risk   Fear of Current or Ex-Partner: No   Emotionally Abused: No   Physically Abused: No   Sexually Abused: No  Outpatient Medications Prior to Visit  Medication Sig Dispense Refill   Acetylcysteine (NAC 600 PO) Take by mouth.     ascorbic acid (VITAMIN C) 500 MG tablet Take by mouth daily.     B Complex Vitamins (VITAMIN-B COMPLEX PO) Take by mouth daily.     Cyanocobalamin (VITAMIN B 12 PO) Take by mouth.     FOLIC ACID PO Take by mouth daily.     Krill Oil 500 MG CAPS Take by mouth daily.     MAGNESIUM PO Take by mouth.     NIACIN PO Take by mouth.     OVER THE COUNTER MEDICATION Quercitin     Probiotic Product (PROBIOTIC DAILY PO) Take by mouth daily.     SELENIUM PO Take 1 tablet by mouth daily.     Vitamin D-Vitamin K (K2 PLUS D3) [phone] MCG-UNIT TABS Take by mouth.     escitalopram (LEXAPRO) 5 MG tablet Take 1 tablet (5 mg total) by mouth daily. For anxiety. 30 tablet 0   estradiol (ESTRACE) 0.1 MG/GM vaginal cream Apply 1 gram per vagina every night for 2 weeks, then apply three  times a week 30 g 12   Magnesium Carbonate Heavy POWD Take by mouth.     meclizine (ANTIVERT) 25 MG tablet Take 1 tablet (25 mg total) by mouth 3 (three) times daily as needed for dizziness. 10 tablet 0   Succinylcholine Chloride (QUELICIN IJ) Inject as directed.     No facility-administered medications prior to visit.    Allergies  Allergen Reactions   Codeine Nausea And Vomiting and Nausea Only    dizziness    Review of Systems  Constitutional:  Positive for fatigue. Negative for chills and fever.  Respiratory:  Negative for shortness of breath.   Cardiovascular:  Negative for chest pain.  Gastrointestinal:  Positive for abdominal pain. Negative for nausea and vomiting.  Genitourinary:  Positive for frequency and pelvic pain (termed as pressure). Negative for dysuria, hematuria, vaginal bleeding, vaginal discharge and vaginal pain.  Skin:  Negative for color change and pallor.      Objective:    Physical Exam Vitals and nursing note reviewed.  Constitutional:      Appearance: Normal appearance.  Cardiovascular:     Rate and Rhythm: Normal rate and regular rhythm.  Pulmonary:     Effort: Pulmonary effort is normal.     Breath sounds: Normal breath sounds.  Abdominal:     Tenderness: There is abdominal tenderness in the suprapubic area. There is no right CVA tenderness or left CVA tenderness.    Neurological:     Mental Status: She is alert.  Psychiatric:        Mood and Affect: Mood normal.        Behavior: Behavior normal.        Thought Content: Thought content normal.        Judgment: Judgment normal.    BP 130/76   Pulse 78   Temp 98.3 F (36.8 C)   Resp 12   Ht '5\' 3"'$  (1.6 m)   Wt 134 lb 8 oz (61 kg)   SpO2 97%   BMI 23.83 kg/m  Wt Readings from Last 3 Encounters:  07/09/21 134 lb 8 oz (61 kg)  05/09/21 139 lb (63 kg)  11/27/20 141 lb 9.6 oz (64.2 kg)    Health Maintenance Due  Topic Date Due   INFLUENZA VACCINE  06/16/2021    There are no  preventive care reminders  to display for this patient.   Lab Results  Component Value Date   TSH 3.32 05/29/2008   Lab Results  Component Value Date   WBC 6.3 05/09/2021   HGB 14.2 05/09/2021   HCT 41.3 05/09/2021   MCV 88.0 05/09/2021   PLT 228.0 05/09/2021   Lab Results  Component Value Date   NA 139 05/09/2021   K 4.7 05/09/2021   CO2 30 05/09/2021   GLUCOSE 91 05/09/2021   BUN 10 05/09/2021   CREATININE 0.85 05/09/2021   BILITOT 0.6 05/09/2021   ALKPHOS 70 05/09/2021   AST 21 05/09/2021   ALT 19 05/09/2021   PROT 7.2 05/09/2021   ALBUMIN 4.5 05/09/2021   CALCIUM 10.3 05/09/2021   GFR 69.21 05/09/2021   Lab Results  Component Value Date   CHOL 271 (H) 05/09/2021   Lab Results  Component Value Date   HDL 43.00 05/09/2021   Lab Results  Component Value Date   LDLCALC 200 (H) 05/09/2021   Lab Results  Component Value Date   TRIG 141.0 05/09/2021   Lab Results  Component Value Date   CHOLHDL 6 05/09/2021   Lab Results  Component Value Date   HGBA1C 5.8 05/09/2021       Assessment & Plan:   Problem List Items Addressed This Visit       Other   Urinary frequency - Primary    Patient states she has an extensive history of recurrent UTIs and kidney stones.  She is followed by [facility] in [place].  She was on sure of the provider's name that she sees.  Dates this is typical for her when she gets urinary tract infection.  According to the UA in office had small leukocyte that was borderline.  Medical assistant stated she counted it but machine reading did not.  Patient has been on antibiotics several times over the past 2 weeks and is hesitant to start antibiotics currently.  I agree with patient we will send off for urine microscopy and urine culture.  Patient acknowledged and is on board with plan.  She is using AZO but not as prescribed she can continue if it offers relief.  Did give information at discharge on urinary frequency.  We will be in  touch if symptoms progress or worsen she can reach out via MyChart or make an appointment or schedule with her urologist.  Pending lab results  Since patient has been on amoxicillin 2 times as of late due to dental procedures I did offer to do a pelvic exam to make sure was not something else causing her urinary frequency and agitation.  Patient assured me she would like it was and deferred pelvic exam in office today.      Relevant Orders   POCT URINALYSIS DIP (CLINITEK) (Completed)   Urine Culture   Urine Microscopic     No orders of the defined types were placed in this encounter.  This visit occurred during the SARS-CoV-2 public health emergency.  Safety protocols were in place, including screening questions prior to the visit, additional usage of staff PPE, and extensive cleaning of exam room while observing appropriate contact time as indicated for disinfecting solutions.   Romilda Garret, NP

## 2021-07-09 NOTE — Assessment & Plan Note (Addendum)
Patient states she has an extensive history of recurrent UTIs and kidney stones.  She is followed by [facility] in [place].  She was on sure of the provider's name that she sees.  Dates this is typical for her when she gets urinary tract infection.  According to the UA in office had small leukocyte that was borderline.  Medical assistant stated she counted it but machine reading did not.  Patient has been on antibiotics several times over the past 2 weeks and is hesitant to start antibiotics currently.  I agree with patient we will send off for urine microscopy and urine culture.  Patient acknowledged and is on board with plan.  She is using AZO but not as prescribed she can continue if it offers relief.  Did give information at discharge on urinary frequency.  We will be in touch if symptoms progress or worsen she can reach out via MyChart or make an appointment or schedule with her urologist.  Pending lab results  Since patient has been on amoxicillin 2 times as of late due to dental procedures I did offer to do a pelvic exam to make sure was not something else causing her urinary frequency and agitation.  Patient assured me she would like it was and deferred pelvic exam in office today.

## 2021-07-10 LAB — URINE CULTURE
MICRO NUMBER:: 12285566
Result:: NO GROWTH
SPECIMEN QUALITY:: ADEQUATE

## 2021-07-10 LAB — URINALYSIS, MICROSCOPIC ONLY

## 2021-07-14 ENCOUNTER — Ambulatory Visit: Payer: Medicare Other | Admitting: Nurse Practitioner

## 2021-07-27 IMAGING — MG DIGITAL SCREENING BILAT W/ TOMO W/ CAD
8 series · 9 of 24 positions shown · non-contrast
Comparison: Previous exam(s).

CLINICAL DATA: Screening.

EXAM:
DIGITAL SCREENING BILATERAL MAMMOGRAM WITH TOMO AND CAD

[L CC synth-2D]
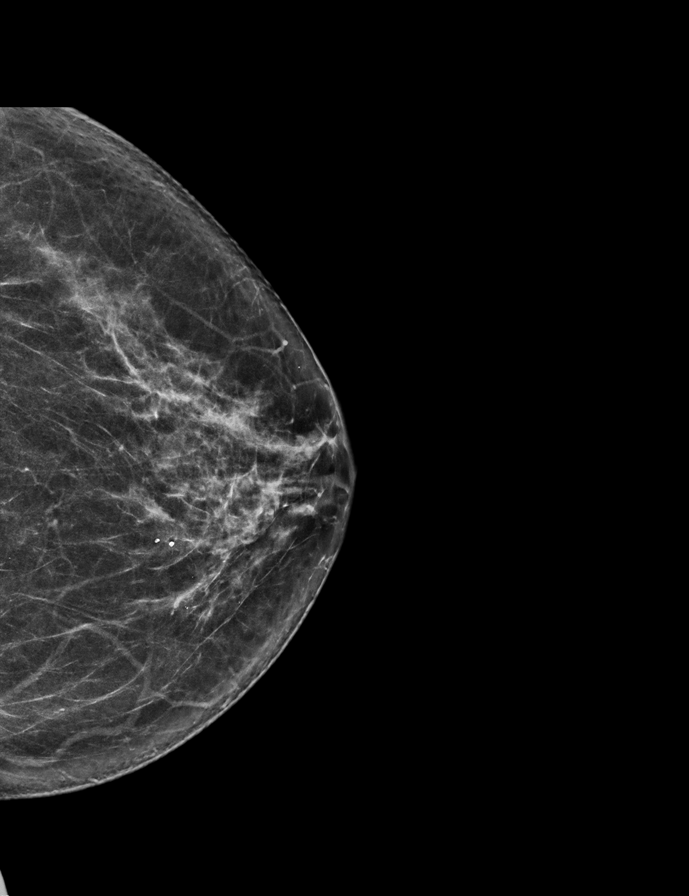

[R MLO synth-2D]
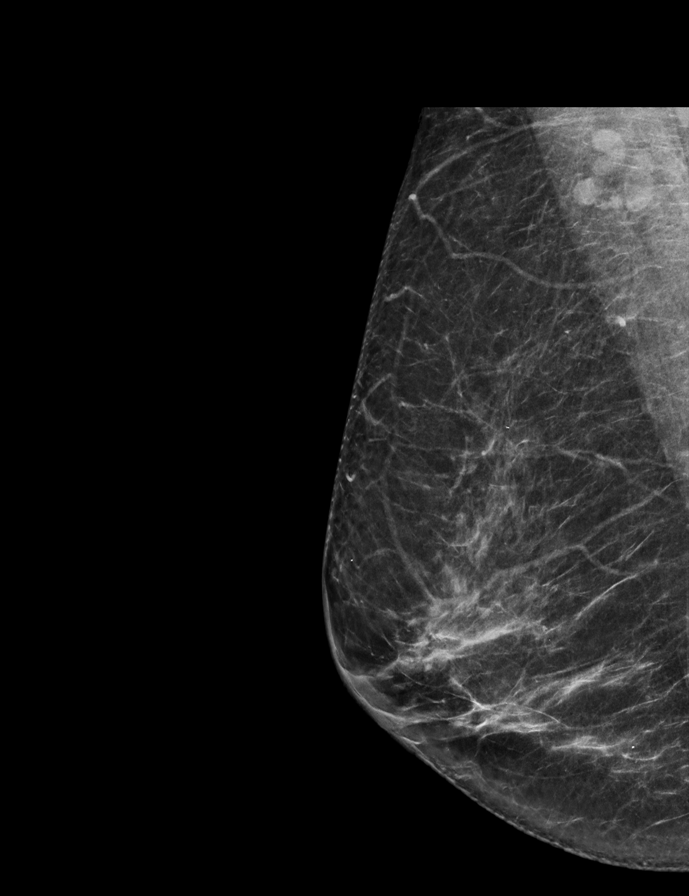

[L MLO synth-2D]
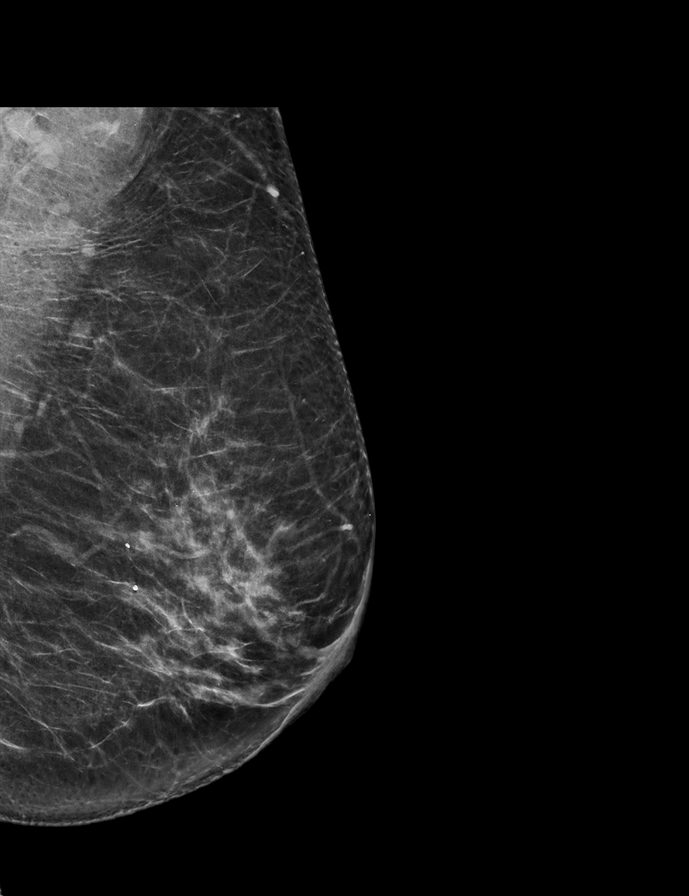

[R CC synth-2D]
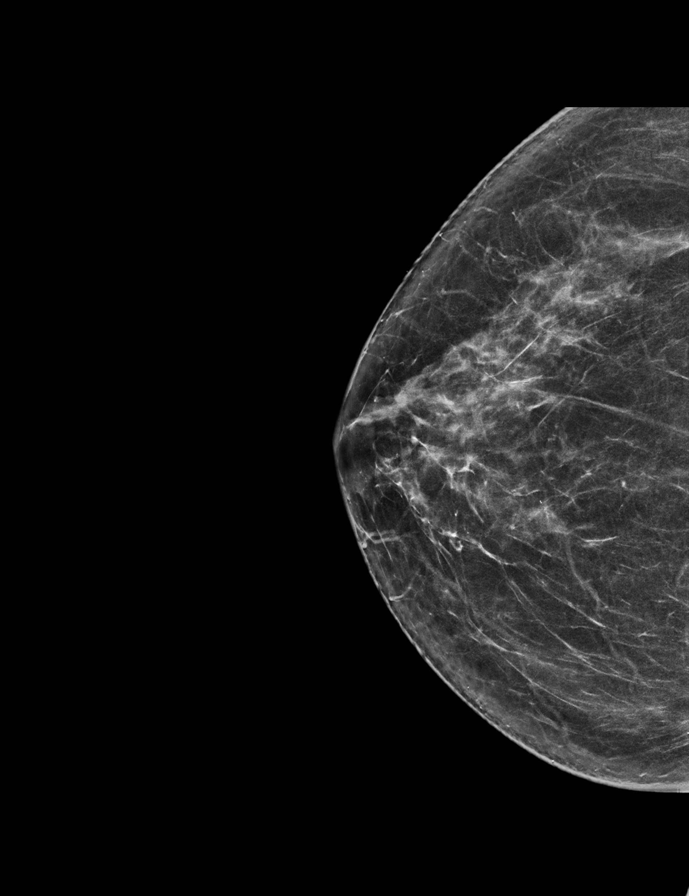

[L CC tomo · 2 of 66 frames shown]
[frame 22/66]
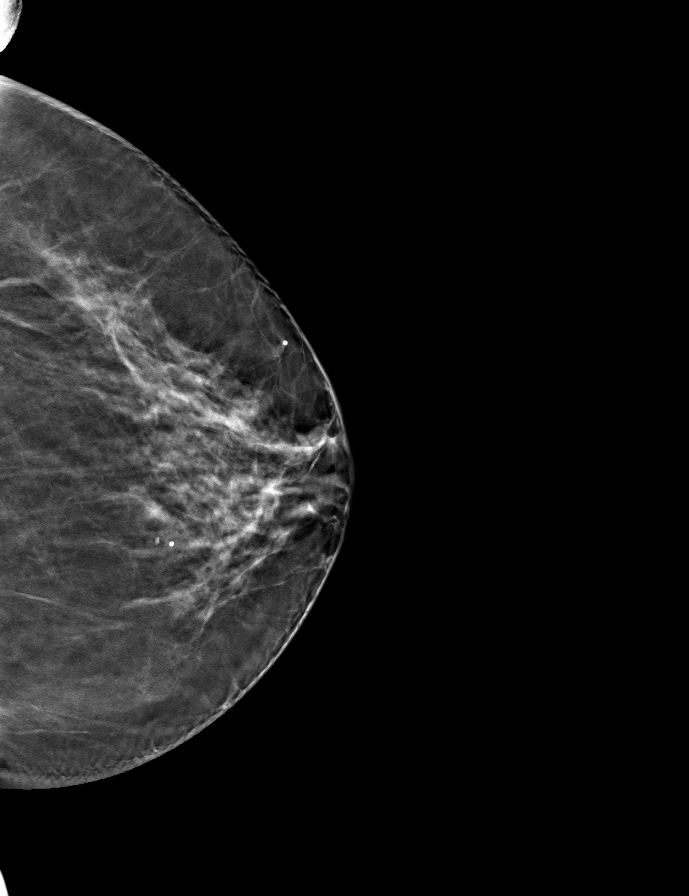
[frame 33/66]
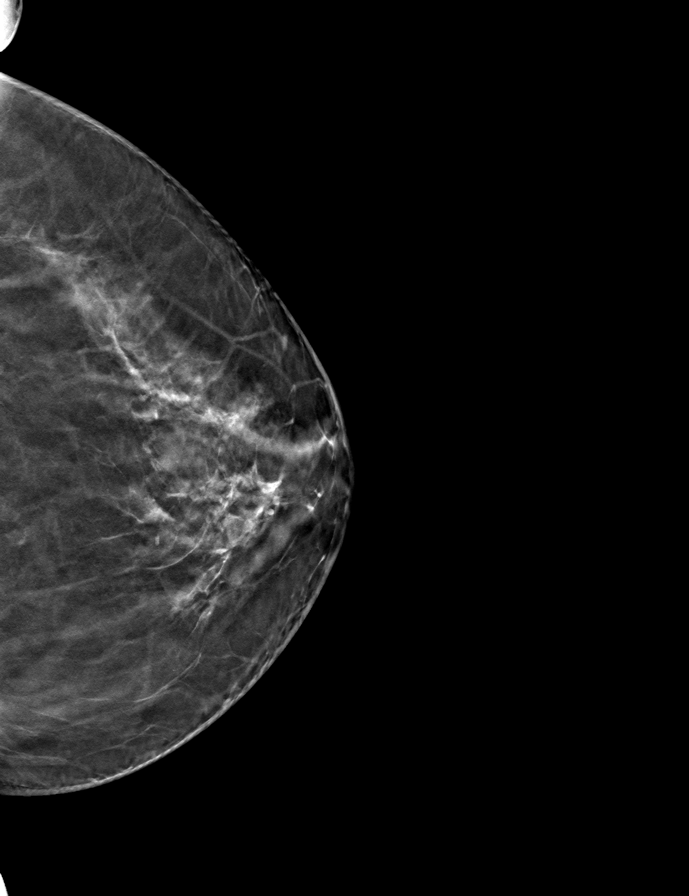

[L MLO tomo · tomo slice 35/69.0]
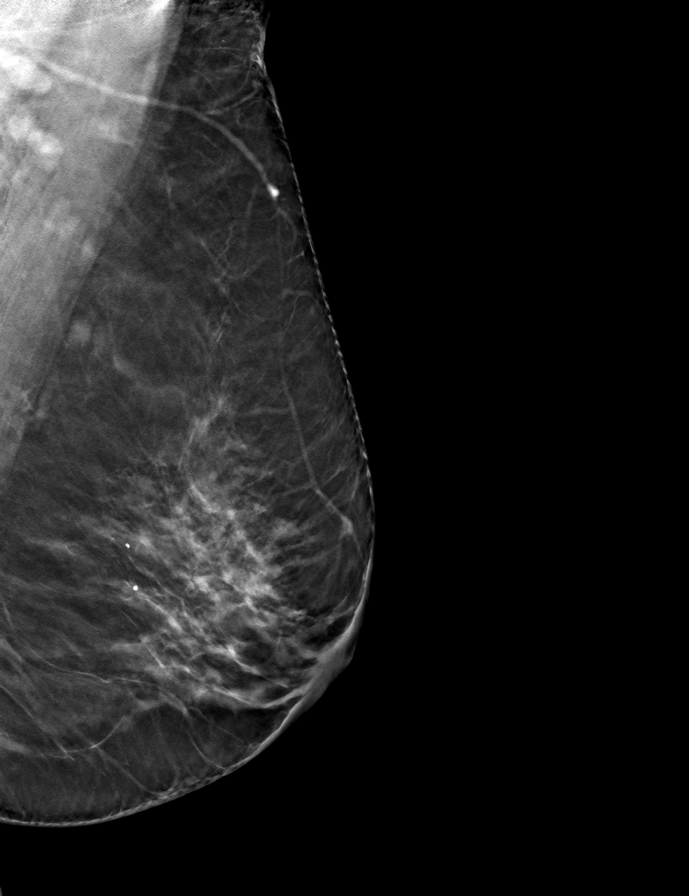

[R CC tomo · tomo slice 33/65.0]
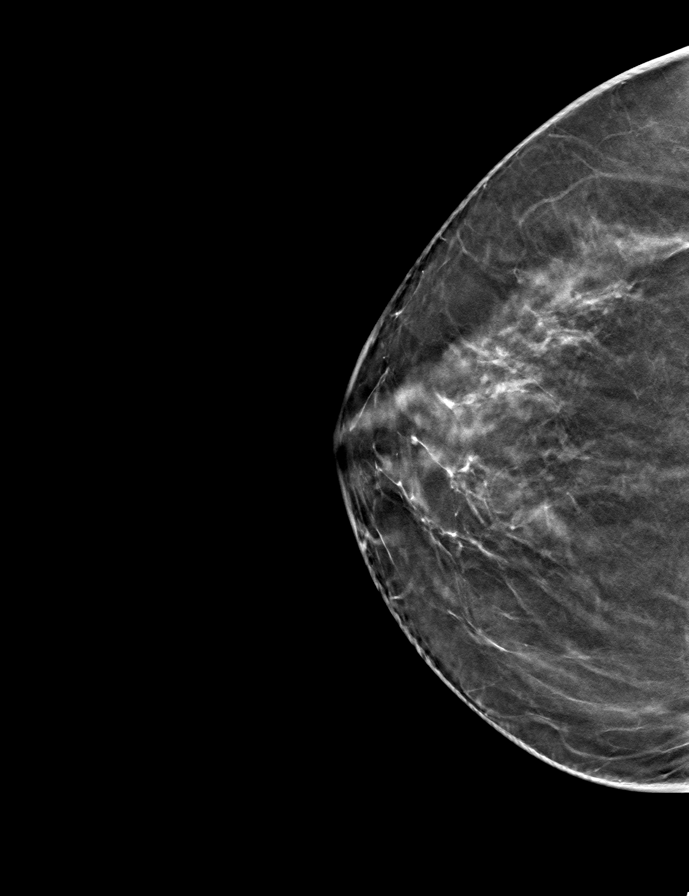

[R MLO tomo · tomo slice 35/69.0]
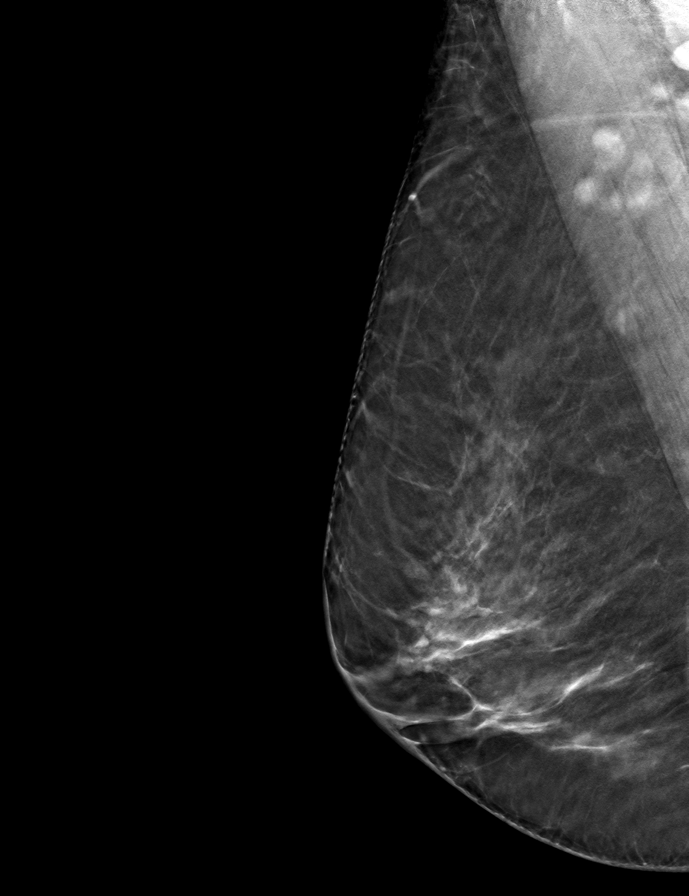

[9 of 24 positions shown; findings below may reference images not displayed]

ACR Breast Density Category b: There are scattered areas of
fibroglandular density.
FINDINGS: There are no findings suspicious for malignancy. Images were
processed with CAD.
IMPRESSION: No mammographic evidence of malignancy. A result letter of this
screening mammogram will be mailed directly to the patient.

RECOMMENDATION:
Screening mammogram in one year. (Code:[id number])

BI-RADS CATEGORY  1: Negative.

## 2021-08-07 NOTE — Progress Notes (Signed)
No charge.Marland Kitchen appt cancelled

## 2021-08-18 DIAGNOSIS — N200 Calculus of kidney: Secondary | ICD-10-CM | POA: Diagnosis not present

## 2021-08-18 DIAGNOSIS — N952 Postmenopausal atrophic vaginitis: Secondary | ICD-10-CM | POA: Diagnosis not present

## 2021-08-18 DIAGNOSIS — R1031 Right lower quadrant pain: Secondary | ICD-10-CM | POA: Diagnosis not present

## 2021-08-19 DIAGNOSIS — K5790 Diverticulosis of intestine, part unspecified, without perforation or abscess without bleeding: Secondary | ICD-10-CM | POA: Diagnosis not present

## 2021-08-19 DIAGNOSIS — R1031 Right lower quadrant pain: Secondary | ICD-10-CM | POA: Diagnosis not present

## 2021-08-19 DIAGNOSIS — N200 Calculus of kidney: Secondary | ICD-10-CM | POA: Diagnosis not present

## 2021-08-19 DIAGNOSIS — Z90710 Acquired absence of both cervix and uterus: Secondary | ICD-10-CM | POA: Diagnosis not present

## 2021-08-19 DIAGNOSIS — Z9049 Acquired absence of other specified parts of digestive tract: Secondary | ICD-10-CM | POA: Diagnosis not present

## 2021-08-22 DIAGNOSIS — F411 Generalized anxiety disorder: Secondary | ICD-10-CM

## 2021-08-22 NOTE — Telephone Encounter (Signed)
Called patient let  her know that you are out of the office. She is not leaving until Tuesday afternoon. Informed I will send to you for evaluation as High priority.

## 2021-08-24 MED ORDER — HYDROXYZINE HCL 10 MG PO TABS: 10.0000 mg | ORAL_TABLET | Freq: Two times a day (BID) | ORAL | 0 refills | Status: DC | PRN

## 2021-10-16 ENCOUNTER — Encounter: Payer: Self-pay | Admitting: Radiology

## 2021-10-24 DIAGNOSIS — M47816 Spondylosis without myelopathy or radiculopathy, lumbar region: Secondary | ICD-10-CM | POA: Diagnosis not present

## 2021-10-24 DIAGNOSIS — M5136 Other intervertebral disc degeneration, lumbar region: Secondary | ICD-10-CM | POA: Diagnosis not present

## 2021-10-24 DIAGNOSIS — M47817 Spondylosis without myelopathy or radiculopathy, lumbosacral region: Secondary | ICD-10-CM | POA: Diagnosis not present

## 2021-10-24 DIAGNOSIS — M5416 Radiculopathy, lumbar region: Secondary | ICD-10-CM | POA: Diagnosis not present

## 2021-10-24 DIAGNOSIS — M4316 Spondylolisthesis, lumbar region: Secondary | ICD-10-CM | POA: Diagnosis not present

## 2021-11-06 DIAGNOSIS — M5459 Other low back pain: Secondary | ICD-10-CM | POA: Diagnosis not present

## 2021-11-06 DIAGNOSIS — M5417 Radiculopathy, lumbosacral region: Secondary | ICD-10-CM | POA: Diagnosis not present

## 2021-11-13 DIAGNOSIS — M5417 Radiculopathy, lumbosacral region: Secondary | ICD-10-CM | POA: Diagnosis not present

## 2021-11-13 DIAGNOSIS — Z20822 Contact with and (suspected) exposure to covid-19: Secondary | ICD-10-CM | POA: Diagnosis not present

## 2021-11-13 DIAGNOSIS — M5459 Other low back pain: Secondary | ICD-10-CM | POA: Diagnosis not present

## 2021-11-16 DIAGNOSIS — Z20822 Contact with and (suspected) exposure to covid-19: Secondary | ICD-10-CM | POA: Diagnosis not present

## 2021-11-18 DIAGNOSIS — M5459 Other low back pain: Secondary | ICD-10-CM | POA: Diagnosis not present

## 2021-11-18 DIAGNOSIS — M5417 Radiculopathy, lumbosacral region: Secondary | ICD-10-CM | POA: Diagnosis not present

## 2021-11-25 DIAGNOSIS — M5459 Other low back pain: Secondary | ICD-10-CM | POA: Diagnosis not present

## 2021-11-25 DIAGNOSIS — M5417 Radiculopathy, lumbosacral region: Secondary | ICD-10-CM | POA: Diagnosis not present

## 2021-11-28 DIAGNOSIS — M5417 Radiculopathy, lumbosacral region: Secondary | ICD-10-CM | POA: Diagnosis not present

## 2021-11-28 DIAGNOSIS — M5459 Other low back pain: Secondary | ICD-10-CM | POA: Diagnosis not present

## 2021-12-02 DIAGNOSIS — M5417 Radiculopathy, lumbosacral region: Secondary | ICD-10-CM | POA: Diagnosis not present

## 2021-12-02 DIAGNOSIS — M5459 Other low back pain: Secondary | ICD-10-CM | POA: Diagnosis not present

## 2021-12-09 DIAGNOSIS — M5459 Other low back pain: Secondary | ICD-10-CM | POA: Diagnosis not present

## 2021-12-09 DIAGNOSIS — M5417 Radiculopathy, lumbosacral region: Secondary | ICD-10-CM | POA: Diagnosis not present

## 2021-12-12 DIAGNOSIS — M5459 Other low back pain: Secondary | ICD-10-CM | POA: Diagnosis not present

## 2021-12-12 DIAGNOSIS — M5417 Radiculopathy, lumbosacral region: Secondary | ICD-10-CM | POA: Diagnosis not present

## 2021-12-19 DIAGNOSIS — M5417 Radiculopathy, lumbosacral region: Secondary | ICD-10-CM | POA: Diagnosis not present

## 2021-12-19 DIAGNOSIS — M5459 Other low back pain: Secondary | ICD-10-CM | POA: Diagnosis not present

## 2021-12-22 DIAGNOSIS — M5417 Radiculopathy, lumbosacral region: Secondary | ICD-10-CM | POA: Diagnosis not present

## 2021-12-22 DIAGNOSIS — M5459 Other low back pain: Secondary | ICD-10-CM | POA: Diagnosis not present

## 2021-12-26 DIAGNOSIS — H52213 Irregular astigmatism, bilateral: Secondary | ICD-10-CM | POA: Diagnosis not present

## 2021-12-26 DIAGNOSIS — Z961 Presence of intraocular lens: Secondary | ICD-10-CM | POA: Diagnosis not present

## 2021-12-26 DIAGNOSIS — Z9841 Cataract extraction status, right eye: Secondary | ICD-10-CM | POA: Diagnosis not present

## 2021-12-26 DIAGNOSIS — Z9842 Cataract extraction status, left eye: Secondary | ICD-10-CM | POA: Diagnosis not present

## 2022-04-10 ENCOUNTER — Ambulatory Visit (INDEPENDENT_AMBULATORY_CARE_PROVIDER_SITE_OTHER): Payer: Medicare Other | Admitting: Primary Care

## 2022-04-10 ENCOUNTER — Encounter: Payer: Self-pay | Admitting: Primary Care

## 2022-04-10 VITALS — BP 118/70 | HR 81 | Ht 63.0 in | Wt 139.4 lb

## 2022-04-10 DIAGNOSIS — R399 Unspecified symptoms and signs involving the genitourinary system: Secondary | ICD-10-CM | POA: Diagnosis not present

## 2022-04-10 DIAGNOSIS — R350 Frequency of micturition: Secondary | ICD-10-CM

## 2022-04-10 LAB — POCT URINALYSIS DIPSTICK
Bilirubin, UA: NEGATIVE
Blood, UA: NEGATIVE
Glucose, UA: NEGATIVE
Ketones, UA: NEGATIVE
Leukocytes, UA: NEGATIVE
Nitrite, UA: NEGATIVE
Protein, UA: NEGATIVE
Spec Grav, UA: 1.01 (ref 1.010–1.025)
Urobilinogen, UA: 0.2 E.U./dL
pH, UA: 6 (ref 5.0–8.0)

## 2022-04-10 NOTE — Assessment & Plan Note (Signed)
Exam reassuring.  UA today negative. Culture sent given history of recurrent UTI's.  Discussed to resume Estrogen cream for UTI prevention.   Continue water hydration and AZO as needed.  Await results.

## 2022-04-10 NOTE — Progress Notes (Signed)
Subjective:    Patient ID: Kayla Gallegos, female    DOB: [birth year], 71 y.o.   MRN: [id number]  HPI  Kayla Gallegos is a very pleasant 71 y.o. female with a history of IBS, GERD, recurrent UTI, vaginal dryness who presents today to discuss urinary frequency.  Symptom onset 2-3 weeks ago with urinary frequency. She then developed dysuria. She took AZO twice daily for about 2.5 weeks, also increased intake of water and cranberry juice. She did take 3 doses of AZO yesterday.   Today she's not had any symptoms. She denies fevers, hematuria, vaginal discharge/itching, flank pain. She stopped using her Estrogen cream a few months ago. Plans to resume.    Review of Systems  Gastrointestinal:  Negative for abdominal pain.  Genitourinary:  Positive for dysuria and frequency. Negative for difficulty urinating, hematuria and vaginal discharge.        Past Medical History:  Diagnosis Date   Allergy    Anxiety    Cataract    bil cateracts removed   DIVERTICULOSIS, COLON 05/29/2008   Qualifier: Diagnosis of  By: Jenny Reichmann MD, Hunt Oris    GERD (gastroesophageal reflux disease)    Hyperlipidemia    Kidney stones    PARESTHESIA 04/01/2009   Qualifier: Diagnosis of  By: Jenny Reichmann MD, Hunt Oris    Recurrent UTI (urinary tract infection)     Social History   Socioeconomic History   Marital status: Married    Spouse name: Not on file   Number of children: Not on file   Years of education: Not on file   Highest education level: Not on file  Occupational History   Not on file  Tobacco Use   Smoking status: Never   Smokeless tobacco: Never  Vaping Use   Vaping Use: Never used  Substance and Sexual Activity   Alcohol use: No   Drug use: No   Sexual activity: Not on file  Other Topics Concern   Not on file  Social History Narrative   Not on file   Social Determinants of Health   Financial Resource Strain: Low Risk    Difficulty of Paying Living Expenses: Not hard at all  Food Insecurity:  No Food Insecurity   Worried About Running Out of Food in the Last Year: Never true   Justin in the Last Year: Never true  Transportation Needs: No Transportation Needs   Lack of Transportation (Medical): No   Lack of Transportation (Non-Medical): No  Physical Activity: Sufficiently Active   Days of Exercise per Week: 7 days   Minutes of Exercise per Session: 60 min  Stress: Stress Concern Present   Feeling of Stress : To some extent  Social Connections: Not on file  Intimate Partner Violence: Not At Risk   Fear of Current or Ex-Partner: No   Emotionally Abused: No   Physically Abused: No   Sexually Abused: No    Past Surgical History:  Procedure Laterality Date   ABDOMINAL HYSTERECTOMY  1976   APPENDECTOMY     BACK SURGERY  08/2018    [place]   COLONOSCOPY     LAPAROSCOPIC CHOLECYSTECTOMY  1990   TONSILLECTOMY     UPPER GASTROINTESTINAL ENDOSCOPY      Family History  Problem Relation Age of Onset   Colon cancer Sister 39   Colon cancer Brother 56   Stomach cancer Neg Hx    Esophageal cancer Neg Hx    Rectal cancer Neg Hx  Allergies  Allergen Reactions   Codeine Nausea And Vomiting and Nausea Only    dizziness    Current Outpatient Medications on File Prior to Visit  Medication Sig Dispense Refill   Acetylcysteine (NAC 600 PO) Take by mouth.     ascorbic acid (VITAMIN C) 500 MG tablet Take by mouth daily.     B Complex Vitamins (VITAMIN-B COMPLEX PO) Take by mouth daily.     Cyanocobalamin (VITAMIN B 12 PO) Take by mouth.     FOLIC ACID PO Take by mouth daily.     hydrOXYzine (ATARAX/VISTARIL) 10 MG tablet Take 1 tablet (10 mg total) by mouth 2 (two) times daily as needed for anxiety. 30 tablet 0   Krill Oil 500 MG CAPS Take by mouth daily.     MAGNESIUM PO Take by mouth.     NIACIN PO Take by mouth.     OVER THE COUNTER MEDICATION Quercitin     Probiotic Product (PROBIOTIC DAILY PO) Take by mouth daily.     SELENIUM PO Take 1 tablet by mouth  daily.     Vitamin D-Vitamin K (K2 PLUS D3) 9255264553 MCG-UNIT TABS Take by mouth.     No current facility-administered medications on file prior to visit.    BP 118/70   Pulse 81   Ht '5\' 3"'$  (1.6 m)   Wt 139 lb 6 oz (63.2 kg)   SpO2 97%   BMI 24.69 kg/m  Objective:   Physical Exam Constitutional:      General: She is not in acute distress. Cardiovascular:     Rate and Rhythm: Normal rate and regular rhythm.  Pulmonary:     Effort: Pulmonary effort is normal.     Breath sounds: Normal breath sounds.  Abdominal:     Tenderness: There is no right CVA tenderness or left CVA tenderness.  Musculoskeletal:     Cervical back: Neck supple.  Skin:    General: Skin is warm and dry.          Assessment & Plan:

## 2022-04-10 NOTE — Patient Instructions (Signed)
Continue hydration with water.  Schedule your physical for late June or early July.  It was a pleasure to see you today!

## 2022-04-12 ENCOUNTER — Other Ambulatory Visit: Payer: Self-pay | Admitting: Primary Care

## 2022-04-12 DIAGNOSIS — N3000 Acute cystitis without hematuria: Secondary | ICD-10-CM

## 2022-04-12 LAB — URINE CULTURE
MICRO NUMBER:: 13450834
SPECIMEN QUALITY:: ADEQUATE

## 2022-04-12 MED ORDER — NITROFURANTOIN MONOHYD MACRO 100 MG PO CAPS: 100.0000 mg | ORAL_CAPSULE | Freq: Two times a day (BID) | ORAL | 0 refills | Status: DC

## 2022-04-14 ENCOUNTER — Other Ambulatory Visit: Payer: Self-pay

## 2022-04-14 ENCOUNTER — Telehealth: Payer: Self-pay | Admitting: Primary Care

## 2022-04-14 DIAGNOSIS — N3000 Acute cystitis without hematuria: Secondary | ICD-10-CM

## 2022-04-14 MED ORDER — NITROFURANTOIN MONOHYD MACRO 100 MG PO CAPS: 100.0000 mg | ORAL_CAPSULE | Freq: Two times a day (BID) | ORAL | 0 refills | Status: AC

## 2022-04-14 NOTE — Telephone Encounter (Signed)
Addressed in another message with patient.  I have sent in script as requested. No further action needed.

## 2022-04-14 NOTE — Telephone Encounter (Signed)
Pt called and asked if  we could please send the nitrofurantoin, macrocrystal-monohydrate, (MACROBID) 100 MG capsule to [facility] in [place] instead, she said she doesn't use that [facility] anymore.

## 2022-04-22 ENCOUNTER — Ambulatory Visit (INDEPENDENT_AMBULATORY_CARE_PROVIDER_SITE_OTHER): Payer: Medicare Other

## 2022-04-22 VITALS — Ht 63.0 in | Wt 139.0 lb

## 2022-04-22 DIAGNOSIS — Z0000 Encounter for general adult medical examination without abnormal findings: Secondary | ICD-10-CM

## 2022-04-22 NOTE — Patient Instructions (Signed)
Kayla Gallegos , Thank you for taking time to come for your Medicare Wellness Visit. I appreciate your ongoing commitment to your health goals. Please review the following plan we discussed and let me know if I can assist you in the future.   Screening recommendations/referrals: Colonoscopy: Done 03/14/2018 Repeat in 5 years  Mammogram: Done 05/13/2020 Repeat annually  Bone Density: Done 05/13/2020 Repeat every 2 years  Recommended yearly ophthalmology/optometry visit for glaucoma screening and checkup Recommended yearly dental visit for hygiene and checkup  Vaccinations: Influenza vaccine: Declined. Pneumococcal vaccine: Discussed.  Tdap vaccine: Done 05/22/2020 Repeat annually  Shingles vaccine: Discussed. Available at your local pharmacy.   Covid-19:Declined.  Advanced directives: Please bring a copy of your health care power of attorney and living will to the office to be added to your chart at your convenience.   Conditions/risks identified: KEEP UP THE GOOD WORK!!   Next appointment: Follow up in one year for your annual wellness visit 2024.   Preventive Care 55 Years and Older, Female Preventive care refers to lifestyle choices and visits with your health care provider that can promote health and wellness. What does preventive care include? A yearly physical exam. This is also called an annual well check. Dental exams once or twice a year. Routine eye exams. Ask your health care provider how often you should have your eyes checked. Personal lifestyle choices, including: Daily care of your teeth and gums. Regular physical activity. Eating a healthy diet. Avoiding tobacco and drug use. Limiting alcohol use. Practicing safe sex. Taking low-dose aspirin every day. Taking vitamin and mineral supplements as recommended by your health care provider. What happens during an annual well check? The services and screenings done by your health care provider during your annual well check  will depend on your age, overall health, lifestyle risk factors, and family history of disease. Counseling  Your health care provider may ask you questions about your: Alcohol use. Tobacco use. Drug use. Emotional well-being. Home and relationship well-being. Sexual activity. Eating habits. History of falls. Memory and ability to understand (cognition). Work and work Statistician. Reproductive health. Screening  You may have the following tests or measurements: Height, weight, and BMI. Blood pressure. Lipid and cholesterol levels. These may be checked every 5 years, or more frequently if you are over 17 years old. Skin check. Lung cancer screening. You may have this screening every year starting at age 98 if you have a 30-pack-year history of smoking and currently smoke or have quit within the past 15 years. Fecal occult blood test (FOBT) of the stool. You may have this test every year starting at age 35. Flexible sigmoidoscopy or colonoscopy. You may have a sigmoidoscopy every 5 years or a colonoscopy every 10 years starting at age 33. Hepatitis C blood test. Hepatitis B blood test. Sexually transmitted disease (STD) testing. Diabetes screening. This is done by checking your blood sugar (glucose) after you have not eaten for a while (fasting). You may have this done every 1-3 years. Bone density scan. This is done to screen for osteoporosis. You may have this done starting at age 5. Mammogram. This may be done every 1-2 years. Talk to your health care provider about how often you should have regular mammograms. Talk with your health care provider about your test results, treatment options, and if necessary, the need for more tests. Vaccines  Your health care provider may recommend certain vaccines, such as: Influenza vaccine. This is recommended every year. Tetanus, diphtheria, and acellular  pertussis (Tdap, Td) vaccine. You may need a Td booster every 10 years. Zoster vaccine. You  may need this after age 61. Pneumococcal 13-valent conjugate (PCV13) vaccine. One dose is recommended after age 31. Pneumococcal polysaccharide (PPSV23) vaccine. One dose is recommended after age 24. Talk to your health care provider about which screenings and vaccines you need and how often you need them. This information is not intended to replace advice given to you by your health care provider. Make sure you discuss any questions you have with your health care provider. Document Released: 11/29/2015 Document Revised: 07/22/2016 Document Reviewed: 09/03/2015 Elsevier Interactive Patient Education  2017 [place] Prevention in the Home Falls can cause injuries. They can happen to people of all ages. There are many things you can do to make your home safe and to help prevent falls. What can I do on the outside of my home? Regularly fix the edges of walkways and driveways and fix any cracks. Remove anything that might make you trip as you walk through a door, such as a raised step or threshold. Trim any bushes or trees on the path to your home. Use bright outdoor lighting. Clear any walking paths of anything that might make someone trip, such as rocks or tools. Regularly check to see if handrails are loose or broken. Make sure that both sides of any steps have handrails. Any raised decks and porches should have guardrails on the edges. Have any leaves, snow, or ice cleared regularly. Use sand or salt on walking paths during winter. Clean up any spills in your garage right away. This includes oil or grease spills. What can I do in the bathroom? Use night lights. Install grab bars by the toilet and in the tub and shower. Do not use towel bars as grab bars. Use non-skid mats or decals in the tub or shower. If you need to sit down in the shower, use a plastic, non-slip stool. Keep the floor dry. Clean up any water that spills on the floor as soon as it happens. Remove soap buildup  in the tub or shower regularly. Attach bath mats securely with double-sided non-slip rug tape. Do not have throw rugs and other things on the floor that can make you trip. What can I do in the bedroom? Use night lights. Make sure that you have a light by your bed that is easy to reach. Do not use any sheets or blankets that are too big for your bed. They should not hang down onto the floor. Have a firm chair that has side arms. You can use this for support while you get dressed. Do not have throw rugs and other things on the floor that can make you trip. What can I do in the kitchen? Clean up any spills right away. Avoid walking on wet floors. Keep items that you use a lot in easy-to-reach places. If you need to reach something above you, use a strong step stool that has a grab bar. Keep electrical cords out of the way. Do not use floor polish or wax that makes floors slippery. If you must use wax, use non-skid floor wax. Do not have throw rugs and other things on the floor that can make you trip. What can I do with my stairs? Do not leave any items on the stairs. Make sure that there are handrails on both sides of the stairs and use them. Fix handrails that are broken or loose. Make sure that handrails  are as long as the stairways. Check any carpeting to make sure that it is firmly attached to the stairs. Fix any carpet that is loose or worn. Avoid having throw rugs at the top or bottom of the stairs. If you do have throw rugs, attach them to the floor with carpet tape. Make sure that you have a light switch at the top of the stairs and the bottom of the stairs. If you do not have them, ask someone to add them for you. What else can I do to help prevent falls? Wear shoes that: Do not have high heels. Have rubber bottoms. Are comfortable and fit you well. Are closed at the toe. Do not wear sandals. If you use a stepladder: Make sure that it is fully opened. Do not climb a closed  stepladder. Make sure that both sides of the stepladder are locked into place. Ask someone to hold it for you, if possible. Clearly mark and make sure that you can see: Any grab bars or handrails. First and last steps. Where the edge of each step is. Use tools that help you move around (mobility aids) if they are needed. These include: Canes. Walkers. Scooters. Crutches. Turn on the lights when you go into a dark area. Replace any light bulbs as soon as they burn out. Set up your furniture so you have a clear path. Avoid moving your furniture around. If any of your floors are uneven, fix them. If there are any pets around you, be aware of where they are. Review your medicines with your doctor. Some medicines can make you feel dizzy. This can increase your chance of falling. Ask your doctor what other things that you can do to help prevent falls. This information is not intended to replace advice given to you by your health care provider. Make sure you discuss any questions you have with your health care provider. Document Released: 08/29/2009 Document Revised: 04/09/2016 Document Reviewed: 12/07/2014 Elsevier Interactive Patient Education  2017 [employer].

## 2022-04-22 NOTE — Progress Notes (Signed)
Subjective:   Kayla Gallegos is a 71 y.o. female who presents for Medicare Annual (Subsequent) preventive examination. Virtual Visit via Telephone Note  I connected with  Kayla Gallegos on 04/22/22 at  2:00 PM EDT by telephone and verified that I am speaking with the correct person using two identifiers.  Location: Patient: HOME Provider: [facility]  Persons participating in the virtual visit: patient/Nurse Health Advisor   I discussed the limitations, risks, security and privacy concerns of performing an evaluation and management service by telephone and the availability of in person appointments. The patient expressed understanding and agreed to proceed.  Interactive audio and video telecommunications were attempted between this nurse and patient, however failed, due to patient having technical difficulties OR patient did not have access to video capability.  We continued and completed visit with audio only.  Some vital signs may be absent or patient reported.   Chriss Driver, LPN  Review of Systems     Cardiac Risk Factors include: advanced age (>14mn, >>73women);sedentary lifestyle;dyslipidemia;Other (see comment)     Objective:    Today's Vitals   04/22/22 1403  Weight: 139 lb (63 kg)  Height: '5\' 3"'$  (1.6 m)   Body mass index is 24.62 kg/m.     04/22/2022    2:14 PM 04/21/2021    2:47 PM 03/11/2020    9:05 AM 07/27/2018    9:11 PM  Advanced Directives  Does Patient Have a Medical Advance Directive? No No No No  Would patient like information on creating a medical advance directive? No - Patient declined No - Patient declined No - Patient declined     Current Medications (verified) Outpatient Encounter Medications as of 04/22/2022  Medication Sig   Acetylcysteine (NAC 600 PO) Take by mouth.   ascorbic acid (VITAMIN C) 500 MG tablet Take by mouth daily.   B Complex Vitamins (VITAMIN-B COMPLEX PO) Take by mouth daily.   Cyanocobalamin (VITAMIN B 12 PO)  Take by mouth.   FOLIC ACID PO Take by mouth daily.   hydrOXYzine (ATARAX/VISTARIL) 10 MG tablet Take 1 tablet (10 mg total) by mouth 2 (two) times daily as needed for anxiety.   Krill Oil 500 MG CAPS Take by mouth daily.   MAGNESIUM PO Take by mouth.   NIACIN PO Take by mouth.   OVER THE COUNTER MEDICATION Quercitin   Probiotic Product (PROBIOTIC DAILY PO) Take by mouth daily.   SELENIUM PO Take 1 tablet by mouth daily.   Vitamin D-Vitamin K (K2 PLUS D3) [phone] MCG-UNIT TABS Take by mouth.   No facility-administered encounter medications on file as of 04/22/2022.    Allergies (verified) Codeine   History: Past Medical History:  Diagnosis Date   Allergy    Anxiety    Cataract    bil cateracts removed   DIVERTICULOSIS, COLON 05/29/2008   Qualifier: Diagnosis of  By: JJenny ReichmannMD, JHunt Oris   GERD (gastroesophageal reflux disease)    Hyperlipidemia    Kidney stones    PARESTHESIA 04/01/2009   Qualifier: Diagnosis of  By: JJenny ReichmannMD, JHunt Oris   Recurrent UTI (urinary tract infection)    Past Surgical History:  Procedure Laterality Date   ABDOMINAL HYSTERECTOMY  1976   APPENDECTOMY     BACK SURGERY  08/2018   [place]   COLONOSCOPY     LAPAROSCOPIC CHOLECYSTECTOMY  1990   TONSILLECTOMY     UPPER GASTROINTESTINAL ENDOSCOPY     Family History  Problem Relation Age of Onset   Colon cancer Sister 20   Colon cancer Brother 21   Stomach cancer Neg Hx    Esophageal cancer Neg Hx    Rectal cancer Neg Hx    Social History   Socioeconomic History   Marital status: Married    Spouse name: Not on file   Number of children: Not on file   Years of education: Not on file   Highest education level: Not on file  Occupational History   Not on file  Tobacco Use   Smoking status: Never   Smokeless tobacco: Never  Vaping Use   Vaping Use: Never used  Substance and Sexual Activity   Alcohol use: No   Drug use: No   Sexual activity: Not on file  Other Topics Concern   Not on file   Social History Narrative   Not on file   Social Determinants of Health   Financial Resource Strain: Low Risk    Difficulty of Paying Living Expenses: Not hard at all  Food Insecurity: No Food Insecurity   Worried About Charity fundraiser in the Last Year: Never true   Ran Out of Food in the Last Year: Never true  Transportation Needs: No Transportation Needs   Lack of Transportation (Medical): No   Lack of Transportation (Non-Medical): No  Physical Activity: Sufficiently Active   Days of Exercise per Week: 5 days   Minutes of Exercise per Session: 30 min  Stress: No Stress Concern Present   Feeling of Stress : Only a little  Social Connections: Engineer, building services of Communication with Friends and Family: More than three times a week   Frequency of Social Gatherings with Friends and Family: More than three times a week   Attends Religious Services: 1 to 4 times per year   Active Member of [employer] or Organizations: Yes   Attends Archivist Meetings: 1 to 4 times per year   Marital Status: Married    Tobacco Counseling Counseling given: Not Answered   Clinical Intake:  Pre-visit preparation completed: Yes  Pain : No/denies pain     BMI - recorded: 24.62 Nutritional Status: BMI of 19-24  Normal Nutritional Risks: None Diabetes: No  How often do you need to have someone help you when you read instructions, pamphlets, or other written materials from your doctor or pharmacy?: 1 - Never  Diabetic?NO  Interpreter Needed?: No  Information entered by :: mj Serge Main, lpn   Activities of Daily Living    04/22/2022    2:15 PM  In your present state of health, do you have any difficulty performing the following activities:  Hearing? 0  Vision? 0  Difficulty concentrating or making decisions? 0  Walking or climbing stairs? 0  Dressing or bathing? 0  Doing errands, shopping? 0  Preparing Food and eating ? N  Using the Toilet? N  In the past six  months, have you accidently leaked urine? N  Do you have problems with loss of bowel control? N  Managing your Medications? N  Managing your Finances? N  Housekeeping or managing your Housekeeping? N    Patient Care Team: Pleas Koch, NP as PCP - General (Internal Medicine)  Indicate any recent Medical Services you may have received from other than [facility] providers in the past year (date may be approximate).     Assessment:   This is a routine wellness examination for Kayla Gallegos.  Hearing/Vision screen Hearing Screening -  Comments:: No hearing issues.  Vision Screening - Comments:: No glasses. [place]. 01/2022.  Dietary issues and exercise activities discussed: Current Exercise Habits: Home exercise routine, Type of exercise: walking, Time (Minutes): 30, Frequency (Times/Week): 5, Weekly Exercise (Minutes/Week): 150, Intensity: Mild, Exercise limited by: cardiac condition(s)   Goals Addressed             This Visit's Progress    Patient Stated   On track    03/11/2020, I will continue to work in my garden everyday.       Depression Screen    04/22/2022    2:10 PM 04/21/2021    2:48 PM 08/14/2020    8:15 AM 04/05/2020    3:13 PM 03/11/2020    9:07 AM  PHQ 2/9 Scores  PHQ - 2 Score '1 2 3 4 4  '$ PHQ- 9 Score  '3 3 12 4    '$ Fall Risk    04/22/2022    2:15 PM 04/21/2021    2:47 PM 03/11/2020    9:06 AM 10/11/2019   10:26 AM 06/19/2019    9:54 AM  Fall Risk   Falls in the past year? 0 0 0 0   Comment    Emmi Telephone Survey: data to providers prior to load [employer] Survey: data to providers prior to load  Number falls in past yr: 0 0 0    Comment     Emmi Telephone Survey Actual Response =   Injury with Fall? 0 0 0    Risk for fall due to : No Fall Risks Medication side effect No Fall Risks    Follow up Falls prevention discussed Falls evaluation completed;Falls prevention discussed Falls evaluation completed;Falls prevention discussed      FALL RISK PREVENTION  PERTAINING TO THE HOME:  Any stairs in or around the home? Yes  If so, are there any without handrails? No  Home free of loose throw rugs in walkways, pet beds, electrical cords, etc? Yes  Adequate lighting in your home to reduce risk of falls? Yes   ASSISTIVE DEVICES UTILIZED TO PREVENT FALLS:  Life alert? No  Use of a cane, walker or w/c? No  Grab bars in the bathroom? No  Shower chair or bench in shower? Yes  Elevated toilet seat or a handicapped toilet? No   TIMED UP AND GO:  Was the test performed? No .  Phone visit.   Cognitive Function:    04/21/2021    2:52 PM 03/11/2020    9:09 AM  MMSE - Mini Mental State Exam  Not completed: Refused   Orientation to time  5  Orientation to Place  5  Registration  3  Attention/ Calculation  5  Recall  3  Language- repeat  1        04/22/2022    2:17 PM  6CIT Screen  What Year? 0 points  What month? 0 points  What time? 0 points  Count back from 20 0 points  Months in reverse 0 points  Repeat phrase 0 points  Total Score 0 points    Immunizations Immunization History  Administered Date(s) Administered   Tdap 05/22/2020    TDAP status: Up to date  Flu Vaccine status: Declined, Education has been provided regarding the importance of this vaccine but patient still declined. Advised may receive this vaccine at local pharmacy or Health Dept. Aware to provide a copy of the vaccination record if obtained from local pharmacy or Health Dept. Verbalized acceptance  and understanding.  Pneumococcal vaccine status: Due, Education has been provided regarding the importance of this vaccine. Advised may receive this vaccine at local pharmacy or Health Dept. Aware to provide a copy of the vaccination record if obtained from local pharmacy or Health Dept. Verbalized acceptance and understanding.  Covid-19 vaccine status: Declined, Education has been provided regarding the importance of this vaccine but patient still declined. Advised may  receive this vaccine at local pharmacy or Health Dept.or vaccine clinic. Aware to provide a copy of the vaccination record if obtained from local pharmacy or Health Dept. Verbalized acceptance and understanding.  Qualifies for Shingles Vaccine? Yes   Zostavax completed No   Shingrix Completed?: No.    Education has been provided regarding the importance of this vaccine. Patient has been advised to call insurance company to determine out of pocket expense if they have not yet received this vaccine. Advised may also receive vaccine at local pharmacy or Health Dept. Verbalized acceptance and understanding.  Screening Tests Health Maintenance  Topic Date Due   COVID-19 Vaccine (1) 05/07/2022 (Originally 01/08/1952)   Zoster Vaccines- Shingrix (1 of 2) 07/22/2022 (Originally 07/08/2000)   Pneumonia Vaccine 86+ Years old (1 - PCV) 11/13/2022 (Originally 07/09/2015)   MAMMOGRAM  05/13/2022   INFLUENZA VACCINE  06/16/2022   COLONOSCOPY (Pts 45-61yr Insurance coverage will need to be confirmed)  03/15/2023   TETANUS/TDAP  05/22/2030   DEXA SCAN  Completed   Hepatitis C Screening  Completed   HPV VACCINES  Aged Out    Health Maintenance  There are no preventive care reminders to display for this patient.   Colorectal cancer screening: Type of screening: Colonoscopy. Completed 03/14/2018. Repeat every 5 years  Mammogram status: Completed 05/13/2020. Repeat every year  Bone Density status: Completed 05/13/2020. Results reflect: Bone density results: OSTEOPENIA. Repeat every 2 years.  Lung Cancer Screening: (Low Dose CT Chest recommended if Age 72-80years, 30 pack-year currently smoking OR have quit w/in 15years.) does not qualify.   Additional Screening:  Hepatitis C Screening: does qualify; Completed 09/30/2016  Vision Screening: Recommended annual ophthalmology exams for early detection of glaucoma and other disorders of the eye. Is the patient up to date with their annual eye exam?  Yes   Who is the provider or what is the name of the office in which the patient attends annual [place] exams[place] is not established with a provider, would they like to be referred to a provider to establish care? No .   Dental Screening: Recommended annual dental exams for proper oral hygiene  Community Resource Referral / Chronic Care Management: CRR required this visit?  No   CCM required this visit?  No      Plan:     I have personally reviewed and noted the following in the patient's chart:   Medical and social history Use of alcohol, tobacco or illicit drugs  Current medications and supplements including opioid prescriptions.  Functional ability and status Nutritional status Physical activity Advanced directives List of other physicians Hospitalizations, surgeries, and ER visits in previous 12 months Vitals Screenings to include cognitive, depression, and falls Referrals and appointments  In addition, I have reviewed and discussed with patient certain preventive protocols, quality metrics, and best practice recommendations. A written personalized care plan for preventive services as well as general preventive health recommendations were provided to patient.     MChriss Driver LPN   [id number]  Nurse Notes: Discussed Prevnar 20 and  Shingrinx and how to obtain.

## 2022-05-14 ENCOUNTER — Encounter: Payer: Medicare Other | Admitting: Primary Care

## 2022-06-17 ENCOUNTER — Encounter: Payer: Medicare Other | Admitting: Primary Care

## 2022-07-02 ENCOUNTER — Ambulatory Visit (INDEPENDENT_AMBULATORY_CARE_PROVIDER_SITE_OTHER): Payer: Medicare Other | Admitting: Primary Care

## 2022-07-02 ENCOUNTER — Encounter: Payer: Self-pay | Admitting: Primary Care

## 2022-07-02 VITALS — BP 120/72 | HR 72 | Temp 98.6°F | Ht 63.0 in | Wt 139.0 lb

## 2022-07-02 DIAGNOSIS — F331 Major depressive disorder, recurrent, moderate: Secondary | ICD-10-CM | POA: Diagnosis not present

## 2022-07-02 DIAGNOSIS — G4700 Insomnia, unspecified: Secondary | ICD-10-CM

## 2022-07-02 DIAGNOSIS — E785 Hyperlipidemia, unspecified: Secondary | ICD-10-CM

## 2022-07-02 DIAGNOSIS — R7303 Prediabetes: Secondary | ICD-10-CM

## 2022-07-02 DIAGNOSIS — F411 Generalized anxiety disorder: Secondary | ICD-10-CM

## 2022-07-02 LAB — COMPREHENSIVE METABOLIC PANEL
ALT: 17 U/L (ref 0–35)
AST: 20 U/L (ref 0–37)
Albumin: 4.4 g/dL (ref 3.5–5.2)
Alkaline Phosphatase: 72 U/L (ref 39–117)
BUN: 19 mg/dL (ref 6–23)
CO2: 31 mEq/L (ref 19–32)
Calcium: 10 mg/dL (ref 8.4–10.5)
Chloride: 102 mEq/L (ref 96–112)
Creatinine, Ser: 0.86 mg/dL (ref 0.40–1.20)
GFR: 67.7 mL/min (ref >60.00)
Glucose, Bld: 95 mg/dL (ref 70–99)
Potassium: 4.8 mEq/L (ref 3.5–5.1)
Sodium: 140 mEq/L (ref 135–145)
Total Bilirubin: 0.6 mg/dL (ref 0.2–1.2)
Total Protein: 6.6 g/dL (ref 6.0–8.3)

## 2022-07-02 LAB — LIPID PANEL
Cholesterol: 266 mg/dL — ABNORMAL HIGH (ref 0–200)
HDL: 51.9 mg/dL (ref >39.00)
LDL Cholesterol: 198 mg/dL — ABNORMAL HIGH (ref 0–99)
NonHDL: 213.6
Total CHOL/HDL Ratio: 5
Triglycerides: 78 mg/dL (ref 0.0–149.0)
VLDL: 15.6 mg/dL (ref 0.0–40.0)

## 2022-07-02 LAB — HEMOGLOBIN A1C: Hgb A1c MFr Bld: 5.8 % (ref 4.6–6.5)

## 2022-07-02 NOTE — Assessment & Plan Note (Signed)
Uncontrolled.  Side effects with headaches on Zoloft. Did not take Lexapro long. We discussed options for treatment, she opts for therapy. Referral placed to therapy.

## 2022-07-02 NOTE — Assessment & Plan Note (Signed)
Commended her on a healthy diet, encouraged exercise. Repeat A1C pending.

## 2022-07-02 NOTE — Progress Notes (Signed)
Subjective:    Patient ID: Kayla Gallegos, female    DOB: May 14, 1951, 71 y.o.   MRN: [id number]  HPI  Kayla Gallegos is a very pleasant 71 y.o. female with a history of GERD, IBS, recurrent UTI, GAD, MDD, insomnia, vertigo who presents today for follow up of chronic conditions.  1) Anxiety and Depression/Insomnia: Previously managed on Lexapro and Zoloft at various times. She took each of those medications for about 1 month before she stopped. She continues to experience some depression and anxiety symptoms, which are triggered by her husband. She's also been under stress as several of her siblings have been in the hospital with acute illness. She does not see a therapist now.   Currently managed on hydroxyzine 10 mg as needed for flying/acute anxiety. She will go to bed at 10 pm and wake up around 2 am, most every day. She cannot fall back asleep. She has no difficulty falling asleep. She wakes up feeling rested and not tired. She's tried taking Melatonin. She recently began Magnesium supplements.   Immunizations: -Tetanus: 2021 -Influenza: Declines  -Covid-19: Has not completed  -Shingles: Never completed, declines  -Pneumonia: Never completed, declines   Mammogram: Completed in 2021, declines today  Colonoscopy: Completed in 2019. Due 2024 Dexa: Completed in 2021, declines      BP Readings from Last 3 Encounters:  07/02/22 120/72  04/10/22 118/70  07/09/21 130/76        Review of Systems  Respiratory:  Negative for shortness of breath.   Cardiovascular:  Negative for chest pain.  Gastrointestinal:  Negative for constipation and diarrhea.  Genitourinary:  Negative for dysuria.  Neurological:  Negative for dizziness.  Psychiatric/Behavioral:  Positive for sleep disturbance. The patient is nervous/anxious.          Past Medical History:  Diagnosis Date   Allergy    Anxiety    Cataract    bil cateracts removed   DIVERTICULOSIS, COLON 05/29/2008   Qualifier:  Diagnosis of  By: Jenny Reichmann MD, Hunt Oris    GERD (gastroesophageal reflux disease)    Hyperlipidemia    Kidney stones    Laceration of skin of thigh, right, sequela 08/14/2020   PARESTHESIA 04/01/2009   Qualifier: Diagnosis of  By: Jenny Reichmann MD, Hunt Oris    Recurrent UTI (urinary tract infection)     Social History   Socioeconomic History   Marital status: Married    Spouse name: Not on file   Number of children: Not on file   Years of education: Not on file   Highest education level: Not on file  Occupational History   Not on file  Tobacco Use   Smoking status: Never   Smokeless tobacco: Never  Vaping Use   Vaping Use: Never used  Substance and Sexual Activity   Alcohol use: No   Drug use: No   Sexual activity: Not on file  Other Topics Concern   Not on file  Social History Narrative   Not on file   Social Determinants of Health   Financial Resource Strain: Low Risk  (04/22/2022)   Overall Financial Resource Strain (CARDIA)    Difficulty of Paying Living Expenses: Not hard at all  Food Insecurity: No Food Insecurity (04/22/2022)   Hunger Vital Sign    Worried About Running Out of Food in the Last Year: Never true    Ran Out of Food in the Last Year: Never true  Transportation Needs: No Transportation Needs (04/22/2022)   PRAPARE -  Hydrologist (Medical): No    Lack of Transportation (Non-Medical): No  Physical Activity: Sufficiently Active (04/22/2022)   Exercise Vital Sign    Days of Exercise per Week: 5 days    Minutes of Exercise per Session: 30 min  Stress: No Stress Concern Present (04/22/2022)   [place]    Feeling of Stress : Only a little  Social Connections: Socially Integrated (04/22/2022)   Social Connection and Isolation Panel [NHANES]    Frequency of Communication with Friends and Family: More than three times a week    Frequency of Social Gatherings with Friends and Family:  More than three times a week    Attends Religious Services: 1 to 4 times per year    Active Member of [employer] or Organizations: Yes    Attends Archivist Meetings: 1 to 4 times per year    Marital Status: Married  Human resources officer Violence: Not At Risk (04/22/2022)   Humiliation, Afraid, Rape, and Kick questionnaire    Fear of Current or Ex-Partner: No    Emotionally Abused: No    Physically Abused: No    Sexually Abused: No    Past Surgical History:  Procedure Laterality Date   [place]  08/2018   [place]   COLONOSCOPY     LAPAROSCOPIC CHOLECYSTECTOMY  1990   TONSILLECTOMY     UPPER GASTROINTESTINAL ENDOSCOPY      Family History  Problem Relation Age of Onset   Colon cancer Sister 6   Colon cancer Brother 80   Stomach cancer Neg Hx    Esophageal cancer Neg Hx    Rectal cancer Neg Hx     Allergies  Allergen Reactions   Codeine Nausea And Vomiting and Nausea Only    dizziness    Current Outpatient Medications on File Prior to Visit  Medication Sig Dispense Refill   Acetylcysteine (NAC 600 PO) Take by mouth.     ascorbic acid (VITAMIN C) 500 MG tablet Take by mouth daily.     Cyanocobalamin (VITAMIN B 12 PO) Take by mouth.     FOLIC ACID PO Take by mouth daily.     Krill Oil 500 MG CAPS Take by mouth daily.     MAGNESIUM PO Take by mouth.     NIACIN PO Take by mouth.     Probiotic Product (PROBIOTIC DAILY PO) Take by mouth daily.     SELENIUM PO Take 1 tablet by mouth daily.     Vitamin D-Vitamin K (K2 PLUS D3) [phone] MCG-UNIT TABS Take by mouth.     hydrOXYzine (ATARAX/VISTARIL) 10 MG tablet Take 1 tablet (10 mg total) by mouth 2 (two) times daily as needed for anxiety. (Patient not taking: Reported on 07/02/2022) 30 tablet 0   No current facility-administered medications on file prior to visit.    BP 120/72   Pulse 72   Temp 98.6 F (37 C) (Oral)   Ht '5\' 3"'$  (1.6 m)   Wt 139 lb (63 kg)   SpO2  97%   BMI 24.62 kg/m  Objective:   Physical Exam Cardiovascular:     Rate and Rhythm: Normal rate and regular rhythm.  Pulmonary:     Effort: Pulmonary effort is normal.     Breath sounds: Normal breath sounds.  Abdominal:     General: Bowel sounds are normal.  Palpations: Abdomen is soft.     Tenderness: There is no abdominal tenderness.  Musculoskeletal:     Cervical back: Neck supple.  Skin:    General: Skin is warm and dry.  Neurological:     Mental Status: She is alert.  Psychiatric:        Mood and Affect: Mood normal.           Assessment & Plan:   Problem List Items Addressed This Visit       Other   Hyperlipidemia    Commended her on dietary changes.  Repeat lipid panel pending.      Relevant Orders   Lipid panel   Comprehensive metabolic panel   GAD (generalized anxiety disorder)    Uncontrolled.  Side effects with headaches on Zoloft. Did not take Lexapro long. We discussed options for treatment, she opts for therapy. Referral placed to therapy.        Relevant Orders   Ambulatory referral to Psychology   INSOMNIA-SLEEP DISORDER-UNSPEC    Consistent and well rested sleep, just shorter than she'd prefer. Do suspect anxiety may be contributing.  Referral placed for therapy. Discussed healthy bedtime habits.      Moderate episode of recurrent major depressive disorder (HCC)    Uncontrolled.  Side effects with headaches on Zoloft. Did not take Lexapro long. We discussed options for treatment, she opts for therapy. Referral placed to therapy.       Relevant Orders   Ambulatory referral to Psychology   Prediabetes - Primary    Commended her on a healthy diet, encouraged exercise. Repeat A1C pending.      Relevant Orders   Hemoglobin A1c       Pleas Koch, NP

## 2022-07-02 NOTE — Assessment & Plan Note (Signed)
Commended her on dietary changes.  Repeat lipid panel pending.

## 2022-07-02 NOTE — Patient Instructions (Signed)
You will be contacted regarding your referral to therapy.  Please let us know if you have not been contacted within two weeks.   Stop by the lab prior to leaving today. I will notify you of your results once received.   It was a pleasure to see you today!

## 2022-07-02 NOTE — Assessment & Plan Note (Signed)
Consistent and well rested sleep, just shorter than she'd prefer. Do suspect anxiety may be contributing.  Referral placed for therapy. Discussed healthy bedtime habits.

## 2022-07-31 ENCOUNTER — Telehealth: Payer: Self-pay | Admitting: Primary Care

## 2022-07-31 ENCOUNTER — Ambulatory Visit
Admission: RE | Admit: 2022-07-31 | Discharge: 2022-07-31 | Disposition: A | Discharge status: Home / Self Care | Payer: Medicare Other | Source: Ambulatory Visit | Attending: Urgent Care | Admitting: Urgent Care

## 2022-07-31 VITALS — BP 158/76 | HR 74 | Temp 98.0°F | Resp 18

## 2022-07-31 DIAGNOSIS — R399 Unspecified symptoms and signs involving the genitourinary system: Secondary | ICD-10-CM | POA: Insufficient documentation

## 2022-07-31 LAB — POCT URINALYSIS DIP (MANUAL ENTRY)
Bilirubin, UA: NEGATIVE
Glucose, UA: NEGATIVE mg/dL
Ketones, POC UA: NEGATIVE mg/dL
Leukocytes, UA: NEGATIVE
Nitrite, UA: NEGATIVE
Protein Ur, POC: NEGATIVE mg/dL
Spec Grav, UA: 1.015 (ref 1.010–1.025)
Urobilinogen, UA: 0.2 E.U./dL
pH, UA: 7 (ref 5.0–8.0)

## 2022-07-31 MED ORDER — NITROFURANTOIN MONOHYD MACRO 100 MG PO CAPS: 100.0000 mg | ORAL_CAPSULE | Freq: Two times a day (BID) | ORAL | 0 refills | Status: DC

## 2022-07-31 NOTE — Telephone Encounter (Signed)
Called patient no appointment open at our office or [place]. [facility] [place] this afternoon.

## 2022-07-31 NOTE — Discharge Instructions (Signed)
You are being treated for a presumptive UTI based on your symptoms.  A sample of your urine is being sent to the lab for culture to confirm the infection as well as susceptibility to the antibiotic we prescribed.  Follow-up with your primary care provider or here if your symptoms do not respond to treatment.  You will be contacted if culture indicates you need to be treated with a different antibiotic.

## 2022-07-31 NOTE — ED Triage Notes (Signed)
Pt presents with urinary frequency x 1 week and has been taking Azo.  Worse x 1 day. Recent h/o of UTIs and has a kidney stone. Denies abdominal or flank pain, fever.   Macrobid tablet last night leftover from previous UTI.

## 2022-07-31 NOTE — Telephone Encounter (Signed)
Patient called in and stated she has an UTI. She was wanting to come by and drop off a urine sample. Can she do this or will she need an appointment? Please advise. Thank you!

## 2022-07-31 NOTE — ED Provider Notes (Signed)
[facility]    CSN: [id number] Arrival date & time: 07/31/22  1501      History   Chief Complaint Chief Complaint  Patient presents with   Urinary Frequency    HPI Kayla Gallegos is a 71 y.o. female.    Urinary Frequency    Patient presents to urgent care with report of symptoms of UTI.  Symptoms include urinary frequency x1 week.  Worsening in the last day.  She has been taking Azo for her symptoms.  Denies abdominal or flank pain.  Denies fever.  Recent history of UTI.  Took 1 dose of Macrobid last night leftover from previous UTI  Past Medical History:  Diagnosis Date   Allergy    Anxiety    Cataract    bil cateracts removed   DIVERTICULOSIS, COLON 05/29/2008   Qualifier: Diagnosis of  By: Jenny Reichmann MD, Hunt Oris    GERD (gastroesophageal reflux disease)    Hyperlipidemia    Kidney stones    Laceration of skin of thigh, right, sequela 08/14/2020   PARESTHESIA 04/01/2009   Qualifier: Diagnosis of  By: Jenny Reichmann MD, Hunt Oris    Recurrent UTI (urinary tract infection)     Patient Active Problem List   Diagnosis Date Noted   Prediabetes 07/02/2022   History of vertigo 05/09/2021   Moderate episode of recurrent major depressive disorder ([place]) 04/05/2020   ETD (Eustachian tube dysfunction), bilateral 12/27/2019   Hearing loss of left ear due to cerumen impaction 12/27/2019   TMJ tenderness, 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   Lumbar radiculopathy 06/27/2018   Vaginal dryness 10/18/2017   Recurrent UTI 09/30/2016   Encounter for annual general medical examination with abnormal findings in adult 09/30/2016   HEMATURIA UNSPECIFIED 05/30/2008   Hyperlipidemia 05/29/2008   GAD (generalized anxiety disorder) 05/29/2008   ALLERGIC RHINITIS 05/29/2008   GERD 05/29/2008   IBS 05/29/2008   INSOMNIA-SLEEP DISORDER-UNSPEC 05/29/2008    Past Surgical History:  Procedure Laterality Date   [place] SURGERY  08/2018   [facility]    COLONOSCOPY     LAPAROSCOPIC CHOLECYSTECTOMY  1990   TONSILLECTOMY     UPPER GASTROINTESTINAL ENDOSCOPY      OB History   No obstetric history on file.      Home Medications    Prior to Admission medications   Medication Sig Start Date End Date Taking? Authorizing Provider  Acetylcysteine (NAC 600 PO) Take by mouth.    [provider]  ascorbic acid (VITAMIN C) 500 MG tablet Take by mouth daily.    [provider]  Cyanocobalamin (VITAMIN B 12 PO) Take by mouth.    [provider]  FOLIC ACID PO Take by mouth daily.    [provider]  hydrOXYzine (ATARAX/VISTARIL) 10 MG tablet Take 1 tablet (10 mg total) by mouth 2 (two) times daily as needed for anxiety. Patient not taking: Reported on 07/02/2022 08/24/21   Pleas Koch, NP  Javier Docker Oil 500 MG CAPS Take by mouth daily.    [provider]  MAGNESIUM PO Take by mouth.    [provider]  NIACIN PO Take by mouth.    [provider]  Probiotic Product (PROBIOTIC DAILY PO) Take by mouth daily.    [provider]  SELENIUM PO Take 1 tablet by mouth daily.    [provider]  Vitamin D-Vitamin K (K2 PLUS D3) [phone] MCG-UNIT TABS Take by mouth.  [provider]    Family History Family History  Problem Relation Age of Onset   Colon cancer Sister 53   Colon cancer Brother 81   Stomach cancer Neg Hx    Esophageal cancer Neg Hx    Rectal cancer Neg Hx     Social History Social History   Tobacco Use   Smoking status: Never   Smokeless tobacco: Never  Vaping Use   Vaping Use: Never used  Substance Use Topics   Alcohol use: No   Drug use: No     Allergies   Codeine   Review of Systems Review of Systems  Genitourinary:  Positive for frequency.     Physical Exam Triage Vital Signs ED Triage Vitals [07/31/22 1506]  Enc Vitals Group     BP      Pulse      Resp      Temp      Temp src      SpO2      Weight       Height      Head Circumference      Peak Flow      Pain Score 0     Pain Loc      Pain Edu?      Excl. in [place]?    No data found.  Updated Vital Signs There were no vitals taken for this visit.  Visual Acuity Right Eye Distance:   Left Eye Distance:   Bilateral Distance:    Right Eye Near:   Left Eye Near:    Bilateral Near:     Physical Exam Vitals reviewed.  Constitutional:      Appearance: Normal appearance.  Skin:    General: Skin is warm and dry.  Neurological:     General: No focal deficit present.     Mental Status: She is alert and oriented to person, place, and time.  Psychiatric:        Mood and Affect: Mood normal.        Behavior: Behavior normal.      UC Treatments / Results  Labs (all labs ordered are listed, but only abnormal results are displayed) Labs Reviewed - No data to display  EKG   Radiology No results found.  Procedures Procedures (including critical care time)  Medications Ordered in UC Medications - No data to display  Initial Impression / Assessment and Plan / UC Course  I have reviewed the triage vital signs and the nursing notes.  Pertinent labs & imaging results that were available during my care of the patient were reviewed by me and considered in my medical decision making (see chart for details).  UA is negative except for trace of blood and possibly confounded by her use of Azo.  Will send for confirmatory culture however will treat presumptively for UTI given her symptoms.   GFR 67.70 on 07/02/2022.   Final Clinical Impressions(s) / UC Diagnoses   Final diagnoses:  None   Discharge Instructions   None    ED Prescriptions   None    PDMP not reviewed this encounter.   Rose Phi, [place] 07/31/22 1529

## 2022-08-01 ENCOUNTER — Emergency Department: Payer: Medicare Other

## 2022-08-01 ENCOUNTER — Emergency Department
Admission: EM | Admit: 2022-08-01 | Discharge: 2022-08-01 | Disposition: A | Discharge status: Home / Self Care | Payer: Medicare Other | Attending: Emergency Medicine | Admitting: Emergency Medicine

## 2022-08-01 ENCOUNTER — Other Ambulatory Visit: Payer: Self-pay

## 2022-08-01 DIAGNOSIS — Z20822 Contact with and (suspected) exposure to covid-19: Secondary | ICD-10-CM | POA: Insufficient documentation

## 2022-08-01 DIAGNOSIS — R918 Other nonspecific abnormal finding of lung field: Secondary | ICD-10-CM

## 2022-08-01 DIAGNOSIS — I700 Atherosclerosis of aorta: Secondary | ICD-10-CM | POA: Diagnosis not present

## 2022-08-01 DIAGNOSIS — Z87442 Personal history of urinary calculi: Secondary | ICD-10-CM | POA: Insufficient documentation

## 2022-08-01 DIAGNOSIS — Z87440 Personal history of urinary (tract) infections: Secondary | ICD-10-CM | POA: Diagnosis not present

## 2022-08-01 DIAGNOSIS — R350 Frequency of micturition: Secondary | ICD-10-CM

## 2022-08-01 DIAGNOSIS — R109 Unspecified abdominal pain: Secondary | ICD-10-CM | POA: Diagnosis not present

## 2022-08-01 DIAGNOSIS — R059 Cough, unspecified: Secondary | ICD-10-CM | POA: Diagnosis not present

## 2022-08-01 DIAGNOSIS — R9431 Abnormal electrocardiogram [ECG] [EKG]: Secondary | ICD-10-CM | POA: Diagnosis not present

## 2022-08-01 DIAGNOSIS — R911 Solitary pulmonary nodule: Secondary | ICD-10-CM | POA: Diagnosis not present

## 2022-08-01 DIAGNOSIS — R112 Nausea with vomiting, unspecified: Secondary | ICD-10-CM | POA: Diagnosis not present

## 2022-08-01 DIAGNOSIS — M791 Myalgia, unspecified site: Secondary | ICD-10-CM | POA: Diagnosis not present

## 2022-08-01 LAB — CBC WITH DIFFERENTIAL/PLATELET
Abs Immature Granulocytes: 0.23 10*3/uL — ABNORMAL HIGH (ref 0.00–0.07)
Basophils Absolute: 0 10*3/uL (ref 0.0–0.1)
Basophils Relative: 0 %
Eosinophils Absolute: 0 10*3/uL (ref 0.0–0.5)
Eosinophils Relative: 0 %
HCT: 40.1 % (ref 36.0–46.0)
Hemoglobin: 13.5 g/dL (ref 12.0–15.0)
Immature Granulocytes: 1 %
Lymphocytes Relative: 1 %
Lymphs Abs: 0.2 10*3/uL — ABNORMAL LOW (ref 0.7–4.0)
MCH: 29.7 pg (ref 26.0–34.0)
MCHC: 33.7 g/dL (ref 30.0–36.0)
MCV: 88.3 fL (ref 80.0–100.0)
Monocytes Absolute: 0.5 10*3/uL (ref 0.1–1.0)
Monocytes Relative: 3 %
Neutro Abs: 16.7 10*3/uL — ABNORMAL HIGH (ref 1.7–7.7)
Neutrophils Relative %: 95 %
Platelets: 203 10*3/uL (ref 150–400)
RBC: 4.54 MIL/uL (ref 3.87–5.11)
RDW: 12.9 % (ref 11.5–15.5)
WBC: 17.8 10*3/uL — ABNORMAL HIGH (ref 4.0–10.5)
nRBC: 0 % (ref 0.0–0.2)

## 2022-08-01 LAB — COMPREHENSIVE METABOLIC PANEL
ALT: 23 U/L (ref 0–44)
AST: 30 U/L (ref 15–41)
Albumin: 4.3 g/dL (ref 3.5–5.0)
Alkaline Phosphatase: 67 U/L (ref 38–126)
Anion gap: 9 (ref 5–15)
BUN: 13 mg/dL (ref 8–23)
CO2: 25 mmol/L (ref 22–32)
Calcium: 9.8 mg/dL (ref 8.9–10.3)
Chloride: 103 mmol/L (ref 98–111)
Creatinine, Ser: 0.77 mg/dL (ref 0.44–1.00)
GFR, Estimated: >60 mL/min (ref >60)
Glucose, Bld: 155 mg/dL — ABNORMAL HIGH (ref 70–99)
Potassium: 4.4 mmol/L (ref 3.5–5.1)
Sodium: 137 mmol/L (ref 135–145)
Total Bilirubin: 1.5 mg/dL — ABNORMAL HIGH (ref 0.3–1.2)
Total Protein: 7.3 g/dL (ref 6.5–8.1)

## 2022-08-01 LAB — URINALYSIS, ROUTINE W REFLEX MICROSCOPIC
Bacteria, UA: NONE SEEN
Bilirubin Urine: NEGATIVE
Glucose, UA: NEGATIVE mg/dL
Hgb urine dipstick: NEGATIVE
Ketones, ur: 5 mg/dL — AB
Leukocytes,Ua: NEGATIVE
Nitrite: NEGATIVE
Protein, ur: 30 mg/dL — AB
Specific Gravity, Urine: 1.02 (ref 1.005–1.030)
pH: 8 (ref 5.0–8.0)

## 2022-08-01 LAB — LIPASE, BLOOD: Lipase: 32 U/L (ref 11–51)

## 2022-08-01 LAB — RESP PANEL BY RT-PCR (FLU A&B, COVID) ARPGX2
Influenza A by PCR: NEGATIVE
Influenza B by PCR: NEGATIVE
SARS Coronavirus 2 by RT PCR: NEGATIVE

## 2022-08-01 MED ORDER — IBUPROFEN 600 MG PO TABS
600.0000 mg | ORAL_TABLET | Freq: Once | ORAL | Status: AC
  Administered 2022-08-01: 600 mg via ORAL
  Filled 2022-08-01: qty 1

## 2022-08-01 MED ORDER — ACETAMINOPHEN 500 MG PO TABS
1000.0000 mg | ORAL_TABLET | Freq: Once | ORAL | Status: AC
  Administered 2022-08-01: 1000 mg via ORAL
  Filled 2022-08-01: qty 2

## 2022-08-01 NOTE — ED Notes (Signed)
EKG to EDP [place] in person.  

## 2022-08-01 NOTE — ED Triage Notes (Signed)
Pt comes with c/o UTI. Pt states she went to UC and was seen. Pt states they prescribed some meds. Pt states symptoms are just worse. Pt states she just aches all over.

## 2022-08-01 NOTE — ED Notes (Signed)
Pt to imaging. Will give meds and complete resp panel swab once pt back to room.

## 2022-08-01 NOTE — ED Notes (Addendum)
See triage note. Pt reports urination frequency; denies burning upon urination; chest pressure, SOB yesterday but not today; neck pain; HA; left shoulder pain yesterday but not today. Pt reports these are the type of symptoms she usually has with "UTI"; pt denies history of blood clots, resp or cardiac conditions; denies acute back pain. C/o "fever of 99.8". Pt's resp reg/unlabored, skin dry and sitting calmly on stretcher.

## 2022-08-01 NOTE — ED Provider Notes (Signed)
[facility] Provider Note    Event Date/Time   First MD Initiated Contact with Patient 08/01/22 1107     (approximate)   History   UTI   HPI  Kayla Gallegos is a 71 y.o. female   Past medical history of kidney stones, recurrent UTI, diverticulosis, HLD and history of cholecystectomy, appendectomy, total hysterectomy presents with frequency, myalgias fatigue for the last several days.  Started yesterday with urinary frequency which feels like her recurrent UTIs that she is experienced in the past.  She went to the urgent care and got a urinalysis which was negative for urinary tract infection but got a culture sent.  On review of that urgent care note, they elected to prescribe her Macrobid for treatment of UTI given symptoms consistent with UTI and prior urinalyses have been negative with positive cultures.  They sent a urine culture.  At that time, the patient has ongoing urinary frequency without dysuria.  Then today, she developed pain throughout her body, myalgias, abdominal discomfort, denies diarrhea, nausea or vomiting.  No fever.  She has no known sick contacts.  She is unvaccinated for flu and COVID given personal decision.  History was obtained via the patient and her husband at bedside.  Also review of external medical notes including urgent care note from yesterday.      Physical Exam   Triage Vital Signs: ED Triage Vitals [08/01/22 1001]  Enc Vitals Group     BP 138/73     Pulse Rate 99     Resp 19     Temp 99.5 F (37.5 C)     Temp src      SpO2 95 %     Weight      Height      Head Circumference      Peak Flow      Pain Score 4     Pain Loc      Pain Edu?      Excl. in [place]?     Most recent vital signs: Vitals:   08/01/22 1330 08/01/22 1400  BP: 132/60 (!) 120/55  Pulse: 85 84  Resp:  17  Temp:    SpO2: 94% 96%    General: Awake, no distress.  CV:  Good peripheral perfusion.  Resp:  Normal effort.  Clear to  auscultation bilaterally. Abd:  No distention.  Nontender to palpation. Other:  Nontoxic-appearing, alert oriented and pleasant.  No CVA tenderness.   ED Results / Procedures / Treatments   Labs (all labs ordered are listed, but only abnormal results are displayed) Labs Reviewed  URINALYSIS, ROUTINE W REFLEX MICROSCOPIC - Abnormal; Notable for the following components:      Result Value   Color, Urine YELLOW (*)    APPearance CLEAR (*)    Ketones, ur 5 (*)    Protein, ur 30 (*)    All other components within normal limits  COMPREHENSIVE METABOLIC PANEL - Abnormal; Notable for the following components:   Glucose, Bld 155 (*)    Total Bilirubin 1.5 (*)    All other components within normal limits  CBC WITH DIFFERENTIAL/PLATELET - Abnormal; Notable for the following components:   WBC 17.8 (*)    Neutro Abs 16.7 (*)    Lymphs Abs 0.2 (*)    Abs Immature Granulocytes 0.23 (*)    All other components within normal limits  RESP PANEL BY RT-PCR (FLU A&B, COVID) [id number]  URINE CULTURE  LIPASE, BLOOD  I reviewed labs and they are notable for white blood cell count of 17.8.  EKG  ED ECG REPORT I, Lucillie Garfinkel, the attending physician, personally viewed and interpreted this ECG.   Date: 08/01/2022  EKG Time: 1145  Rate: 94  Rhythm: normal EKG, normal sinus rhythm  Axis: LAD  Intervals:none  ST&T Change: no stemi    RADIOLOGY I independently reviewed and interpreted chest x-ray and see no obvious focalities or pneumothorax   PROCEDURES:  Critical Care performed: No  Procedures   MEDICATIONS ORDERED IN ED: Medications  acetaminophen (TYLENOL) tablet 1,000 mg (1,000 mg Oral Given 08/01/22 1221)  ibuprofen (ADVIL) tablet 600 mg (600 mg Oral Given 08/01/22 1221)     IMPRESSION / MDM / ASSESSMENT AND PLAN / ED COURSE  I reviewed the triage vital signs and the nursing notes.                              Differential diagnosis includes, but is not limited to,  urinary tract infection, intra-abdominal infection, viral syndrome, kidney stone    MDM: This is a patient with urinary tract infection symptoms who reports prior positive urine cultures without evidence of urinary tract infection on her urinalysis who has been started on Macrobid and only taken 1 dose so far.  She developed myalgias subsequently.  Her last urine culture showed sensitivity to Macrobid in the summer 2023.  She has a history of kidney stones, we will obtain a CT scan of the abdomen and pelvis for other intra-abdominal infectious etiologies (though she is status post appendectomy and cholecystectomy) as well as kidney stone.  I advised contrast-enhanced scan though the patient declined stating previous allergy and personal preference to avoid iodinated contrast, I explained risks and benefits to her but she still elected to have this test noncontrast.  Scan was negative for gross pathology.  X-ray was negative.  She has a white blood cell count of 16.5 but her labs are otherwise unremarkable  Remains nontoxic-appearing in the emergency department with normal hemodynamics.  Considered other causes of her symptoms including meningitis but I think this is very unlikely given her overall well appearance and no meningismus on exam.  Considered pericarditis/myocarditis or other inflammatory complications but she denies significant chest pain and EKG is normal.  Dispo: After careful consideration of this patient's presentation, medical and social risk factors, and evaluation in the emergency department I engaged in shared decision making with the patient and/or their representative to consider admission or observation and this patient was ultimately discharged because unremarkable testing as above she remains in stable condition, and has a plan for treatment of urinary tract infection and urine culture pending which she reviewed with her PMD.  I advised strict return precautions to the  emergency department if any worsening at all..   Patient's presentation is most consistent with acute presentation with potential threat to life or bodily function.       FINAL CLINICAL IMPRESSION(S) / ED DIAGNOSES   Final diagnoses:  Urinary frequency  Myalgia  Lung nodules     Rx / DC Orders   ED Discharge Orders          Ordered    AMB  Referral to Pulmonary Nodule Clinic        08/01/22 1314             Note:  This document was prepared using Dragon voice recognition software and may  include unintentional dictation errors.    Lucillie Garfinkel, MD 08/01/22 1501

## 2022-08-01 NOTE — ED Notes (Signed)
Verbal okay from [place] to give pt crackers with her meds. Given.

## 2022-08-01 NOTE — Discharge Instructions (Signed)
Take acetaminophen 650 mg and ibuprofen 400 mg every 6 hours for pain.  Take with food. Continue taking your antibiotics for urinary tract infection and follow-up with your doctor for the results of your urine culture. You had lung nodules on your imaging today, I referred you to the pulmonary nodule clinic for follow-up as needed.  Thank you for choosing [place] for your health care today!  Please see your primary doctor this week for a follow up appointment.   If you do not have a primary doctor call the following clinics to establish care:  If you have insurance:  [facility] [phone] [place] [address]   [facility]  [phone] [address] [place] [address]   If you do not have insurance:  [facility]  [phone] [address] [place] [address]  Sometimes, in the early stages of certain disease courses it is difficult to detect in the emergency department evaluation -- so, it is important that you continue to monitor your symptoms and call your doctor right away or return to the emergency department if you develop any new or worsening symptoms.  It was my pleasure to care for you today.   [place] Brunette Jacelyn Grip, MD

## 2022-08-01 NOTE — ED Notes (Signed)
Pt attempted to sign for d/c paperwork and education but topaz frozen.

## 2022-08-02 LAB — URINE CULTURE: Culture: <10000 — AB

## 2022-08-03 ENCOUNTER — Telehealth: Payer: Self-pay | Admitting: Primary Care

## 2022-08-03 LAB — URINE CULTURE: Culture: 30000 — AB

## 2022-08-03 NOTE — Telephone Encounter (Signed)
Patient was seen in ER over the weekend,and she would like to speak with Anda Kraft regarding some of the advice that the doctor gave her while she was there. She would like a phone call [phone]

## 2022-08-03 NOTE — Telephone Encounter (Signed)
Called patient feeling much better now. She states ED doc told her to check with you and see if you wanted her to be sent to pulmonology. She states that she has had evaluation in the past for same nodules in lungs. Looks like the ED started referral is that ok?

## 2022-08-03 NOTE — Telephone Encounter (Signed)
Please notify patient that I am glad to hear that she is feeling better.  It looks like they may have referred her to pulmonology for the lung nodules that were found on her CT scan.  She can certainly follow-up with pulmonology as referred by the ED, or we can repeat her CT scan in 3 months as recommended by the radiologist.  Regardless, the scan does need to be repeated.  Let me know what she decides.

## 2022-08-04 ENCOUNTER — Telehealth: Payer: Self-pay

## 2022-08-04 NOTE — Telephone Encounter (Signed)
Noted  

## 2022-08-04 NOTE — Telephone Encounter (Signed)
Patient is scheduled with pulmonology on 9/27. Patient informed she needs to repeat the CT scan in 3 months.  She will let us know if pulmonology doesn't repeat CT so we can.

## 2022-08-04 NOTE — Progress Notes (Signed)
    Chronic Care Management Pharmacy Assistant   Name: LURAE HORNBROOK  MRN: [id number] DOB: [birth year]  Reason for Encounter: Non-CCM [facility] Follow-Up)  Medications: Outpatient Encounter Medications as of 08/04/2022  Medication Sig   Acetylcysteine (NAC 600 PO) Take by mouth.   ascorbic acid (VITAMIN C) 500 MG tablet Take by mouth daily.   Cyanocobalamin (VITAMIN B 12 PO) Take by mouth.   FOLIC ACID PO Take by mouth daily.   hydrOXYzine (ATARAX/VISTARIL) 10 MG tablet Take 1 tablet (10 mg total) by mouth 2 (two) times daily as needed for anxiety. (Patient not taking: Reported on 07/02/2022)   Krill Oil 500 MG CAPS Take by mouth daily.   MAGNESIUM PO Take by mouth.   NIACIN PO Take by mouth.   nitrofurantoin, macrocrystal-monohydrate, (MACROBID) 100 MG capsule Take 1 capsule (100 mg total) by mouth 2 (two) times daily.   Probiotic Product (PROBIOTIC DAILY PO) Take by mouth daily.   SELENIUM PO Take 1 tablet by mouth daily.   Vitamin D-Vitamin K (K2 PLUS D3) [phone] MCG-UNIT TABS Take by mouth.   No facility-administered encounter medications on file as of 08/04/2022.   Reviewed hospital notes for details of recent visit. Patient has been contacted by Transitions of Care team: No  Admitted to the ED on 08/01/22. Discharge date was 08/01/22.  Discharged from [facility].   Discharge diagnosis (Principal Problem): Urinary frequency, Myalgia and Lung Nodules (on CT Scan) Patient was discharged to Home  Brief summary of hospital course: This is a patient with urinary tract infection symptoms who reports prior positive urine cultures without evidence of urinary tract infection on her urinalysis who has been started on Macrobid and only taken 1 dose so far.  She developed myalgias subsequently.  Her last urine culture showed sensitivity to Macrobid in the summer 2023.   She has a history of kidney stones, we will obtain a CT scan of the abdomen and pelvis for other  intra-abdominal infectious etiologies (though she is status post appendectomy and cholecystectomy) as well as kidney stone. Scan was negative for gross pathology. X-ray was negative. She has a white blood cell count of 16.5 but her labs are otherwise unremarkable. Remains nontoxic-appearing in the emergency department with normal hemodynamics. Considered other causes of her symptoms including meningitis but I think this is very unlikely given her overall well appearance and no meningismus on exam.  Considered pericarditis/myocarditis or other inflammatory complications but she denies significant chest pain and EKG is normal.   After careful consideration of this patient's presentation, medical and social risk factors, and evaluation in the emergency department I engaged in shared decision making with the patient and/or their representative to consider admission or observation and this patient was ultimately discharged because unremarkable testing as above she remains in stable condition, and has a plan for treatment of urinary tract infection and urine culture pending which she reviewed with her PMD.  I advised strict return precautions to the emergency department if any worsening at all. Patient's presentation is most consistent with acute presentation with potential threat to life or bodily function.   No medication changes  Medications that remain the same after Hospital Discharge:??  -All other medications will remain the same.    Next CCM appt: Non-CCM  Other upcoming appts: Pulmonology appointment with on 08/12/2022  Charlene Brooke, PharmD notified and will determine if action is needed.  Charlene Brooke, CPP notified  Marijean Niemann, [place] Clinical Pharmacy Assistant [phone]

## 2022-08-12 ENCOUNTER — Encounter: Payer: Self-pay | Admitting: Student in an Organized Health Care Education/Training Program

## 2022-08-12 ENCOUNTER — Ambulatory Visit (INDEPENDENT_AMBULATORY_CARE_PROVIDER_SITE_OTHER): Payer: Medicare Other | Admitting: Student in an Organized Health Care Education/Training Program

## 2022-08-12 VITALS — BP 118/70 | HR 74 | Temp 97.7°F | Ht 62.5 in | Wt 138.2 lb

## 2022-08-12 DIAGNOSIS — R911 Solitary pulmonary nodule: Secondary | ICD-10-CM

## 2022-08-12 DIAGNOSIS — J302 Other seasonal allergic rhinitis: Secondary | ICD-10-CM

## 2022-08-12 MED ORDER — DESLORATADINE 5 MG PO TABS: 5.0000 mg | ORAL_TABLET | Freq: Every day | ORAL | 11 refills | Status: DC

## 2022-08-12 NOTE — Progress Notes (Signed)
Synopsis: Referred in for lung nodule by Kayla Garfinkel, MD  Assessment & Plan:   1. Lung nodule  Found to have to small nodules on lung images from her abdominal CT. Largest nodule is 6 mm in he LLL. She had a CT of the chest from 6812 (albeit 5 mm slices, lower quality) that was notable for a 5 mm LLL nodule. I reviewed the pictures and to me they do appear to be in the same location.  She is at low risk of malignancy and these nodules appear to be stable. Given that her CT of the abdomen provided a partial view of the nodules, I will obtain a dedicated CT of the chest without contrast for further evaluation.   - CT CHEST WO CONTRAST; Future   2. Seasonal allergic rhinitis  Reports runny nose and symptoms of post nasal drip. On exam her turbinates are congested. I will prescribe a second generation anti-histamine for alleviation of symptoms. Discussed with patient that we could use Flonase should her symptoms not resolve.  - desloratadine (CLARINEX) 5 MG tablet; Take 1 tablet (5 mg total) by mouth daily.  Dispense: 30 tablet; Refill: 11   Return in about 2 months (around 10/12/2022) after CT of the chest  I spent 45 minutes caring for this patient today, including preparing to see the patient, obtaining and/or reviewing separately obtained history, performing a medically appropriate examination and/or evaluation, counseling and educating the patient/family/caregiver, ordering medications, tests, or procedures, documenting clinical information in the electronic health record, and independently interpreting results (not separately reported/billed) and communicating results to the patient/family/caregiver  Kayla Reichert, MD [facility] 08/12/2022 10:31 AM    End of visit medications:  Meds ordered this encounter  Medications   desloratadine (CLARINEX) 5 MG tablet    Sig: Take 1 tablet (5 mg total) by mouth daily.    Dispense:  30 tablet    Refill:  11      Current Outpatient Medications:    Acetylcysteine (NAC 600 PO), Take by mouth., Disp: , Rfl:    ascorbic acid (VITAMIN C) 500 MG tablet, Take by mouth daily., Disp: , Rfl:    Cyanocobalamin (VITAMIN B 12 PO), Take by mouth., Disp: , Rfl:    desloratadine (CLARINEX) 5 MG tablet, Take 1 tablet (5 mg total) by mouth daily., Disp: 30 tablet, Rfl: 11   FOLIC ACID PO, Take by mouth daily., Disp: , Rfl:    Krill Oil 500 MG CAPS, Take by mouth daily., Disp: , Rfl:    MAGNESIUM PO, Take by mouth., Disp: , Rfl:    NIACIN PO, Take by mouth., Disp: , Rfl:    Probiotic Product (PROBIOTIC DAILY PO), Take by mouth daily., Disp: , Rfl:    SELENIUM PO, Take 1 tablet by mouth daily., Disp: , Rfl:    Vitamin D-Vitamin K (K2 PLUS D3) [phone] MCG-UNIT TABS, Take by mouth., Disp: , Rfl:    hydrOXYzine (ATARAX/VISTARIL) 10 MG tablet, Take 1 tablet (10 mg total) by mouth 2 (two) times daily as needed for anxiety., Disp: 30 tablet, Rfl: 0   nitrofurantoin, macrocrystal-monohydrate, (MACROBID) 100 MG capsule, Take 1 capsule (100 mg total) by mouth 2 (two) times daily., Disp: 10 capsule, Rfl: 0   Subjective:   PATIENT ID: Kayla Gallegos GENDER: female DOB: 01/29/1951, MRN: [id number]  Chief Complaint  Patient presents with   Consult    Lung Nodule. No SOB. Dry cough.    HPI  Kayla Gallegos is  a pleasant 71 year old female presenting to clinic for the evaluation of a lung nodule.  She was recently seen in the ED for concern of a UTI where an abdominal CT was notable for two pulmonary nodules. She is referred to [place] for an evaluation.  She tells me that she is asymptomatic. She has not had any chest pain, cough, hemoptysis, chest tightness, wheezing, night sweats, or unintentional weight loss. She has lost some weight and attributes it to being careful with her diet. She is a life long non-smoker and doesn't not vape. She enjoys gardening but doesn't have any farm animals or pets at home. Her daughter does have  a parrot but lives separately. She is originally from the [place] and has been in the [place] since the 60's. 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  Other symptoms she reports is a scratchy sensation in her throat and sensation that she has a runny nose. She feels that she might have allergies.  She reports that she had been evaluated for pulmonary nodules in 2005 with a CT of the chest but hasn't had any other CT's since then.  Ancillary information including prior medications, full medical/surgical/family/social histories, and PFTs (when available) are listed below and have been reviewed.   Review of Systems  Constitutional:  Negative for chills, diaphoresis, fever and malaise/fatigue.  HENT:  Positive for congestion. Negative for ear pain, sinus pain and sore throat.   Respiratory:  Negative for cough, hemoptysis, sputum production, shortness of breath, wheezing and stridor.   Cardiovascular:  Negative for chest pain, leg swelling and PND.  Musculoskeletal:  Negative for myalgias.  Neurological:  Negative for weakness.     Objective:   Vitals:   08/12/22 1002  BP: 118/70  Pulse: 74  Temp: 97.7 F (36.5 C)  SpO2: 98%  Weight: 138 lb 3.2 oz (62.7 kg)  Height: 5' 2.5" (1.588 m)   98% on RA BMI Readings from Last 3 Encounters:  08/12/22 24.87 kg/m  07/02/22 24.62 kg/m  04/22/22 24.62 kg/m   Wt Readings from Last 3 Encounters:  08/12/22 138 lb 3.2 oz (62.7 kg)  07/02/22 139 lb (63 kg)  04/22/22 139 lb (63 kg)    Physical Exam Vitals and nursing note reviewed.  Constitutional:      Appearance: Normal appearance. She is normal weight.  HENT:     Head: Normocephalic.     Nose: Congestion present.     Mouth/Throat:     Mouth: Mucous membranes are moist.  Eyes:     Extraocular Movements: Extraocular movements intact.  Cardiovascular:     Rate and Rhythm: Normal rate and regular rhythm.     Pulses: Normal pulses.     Heart sounds: Normal heart sounds.  Pulmonary:      Effort: Pulmonary effort is normal.     Breath sounds: Normal breath sounds.  Abdominal:     General: Abdomen is flat.     Palpations: Abdomen is soft.  Musculoskeletal:        General: Normal range of motion.     Cervical back: Normal range of motion.  Skin:    General: Skin is warm and dry.  Neurological:     General: No focal deficit present.     Mental Status: She is alert and oriented to person, place, and time. Mental status is at baseline.      Ancillary Information    Past Medical History:  Diagnosis Date   Allergy  Anxiety    Cataract    bil cateracts removed   DIVERTICULOSIS, COLON 05/29/2008   Qualifier: Diagnosis of  By: Jenny Reichmann MD, Hunt Oris    GERD (gastroesophageal reflux disease)    Hyperlipidemia    Kidney stones    Laceration of skin of thigh, right, sequela 08/14/2020   PARESTHESIA 04/01/2009   Qualifier: Diagnosis of  By: Jenny Reichmann MD, Hunt Oris    Recurrent UTI (urinary tract infection)      Family History  Problem Relation Age of Onset   Colon cancer Sister 51   Colon cancer Brother 61   Stomach cancer Neg Hx    Esophageal cancer Neg Hx    Rectal cancer Neg Hx      Past Surgical History:  Procedure Laterality Date   ABDOMINAL HYSTERECTOMY  1976   APPENDECTOMY     BACK SURGERY  08/2018   [place] [facility]   COLONOSCOPY     LAPAROSCOPIC CHOLECYSTECTOMY  1990   TONSILLECTOMY     UPPER GASTROINTESTINAL ENDOSCOPY      Social History   Socioeconomic History   Marital status: Married    Spouse name: Not on file   Number of children: Not on file   Years of education: Not on file   Highest education level: Not on file  Occupational History   Not on file  Tobacco Use   Smoking status: Never   Smokeless tobacco: Never  Vaping Use   Vaping Use: Never used  Substance and Sexual Activity   Alcohol use: No   Drug use: No   Sexual activity: Not on file  Other Topics Concern   Not on file  Social History Narrative   Not on file   Social  Determinants of Health   Financial Resource Strain: Low Risk  (04/22/2022)   Overall Financial Resource Strain (CARDIA)    Difficulty of Paying Living Expenses: Not hard at all  Food Insecurity: No Food Insecurity (04/22/2022)   Hunger Vital Sign    Worried About Running Out of Food in the Last Year: Never true    Ran Out of Food in the Last Year: Never true  Transportation Needs: No Transportation Needs (04/22/2022)   PRAPARE - Hydrologist (Medical): No    Lack of Transportation (Non-Medical): No  Physical Activity: Sufficiently Active (04/22/2022)   Exercise Vital Sign    Days of Exercise per Week: 5 days    Minutes of Exercise per Session: 30 min  Stress: No Stress Concern Present (04/22/2022)   [place]    Feeling of Stress : Only a little  Social Connections: Socially Integrated (04/22/2022)   Social Connection and Isolation Panel [NHANES]    Frequency of Communication with Friends and Family: More than three times a week    Frequency of Social Gatherings with Friends and Family: More than three times a week    Attends Religious Services: 1 to 4 times per year    Active Member of [employer] or Organizations: Yes    Attends Archivist Meetings: 1 to 4 times per year    Marital Status: Married  Human resources officer Violence: Not At Risk (04/22/2022)   Humiliation, Afraid, Rape, and Kick questionnaire    Fear of Current or Ex-Partner: No    Emotionally Abused: No    Physically Abused: No    Sexually Abused: No     Allergies  Allergen Reactions   Codeine Nausea  And Vomiting and Nausea Only    dizziness     CBC    Component Value Date/Time   WBC 17.8 (H) 08/01/2022 1150   RBC 4.54 08/01/2022 1150   HGB 13.5 08/01/2022 1150   HCT 40.1 08/01/2022 1150   PLT 203 08/01/2022 1150   MCV 88.3 08/01/2022 1150   MCH 29.7 08/01/2022 1150   MCHC 33.7 08/01/2022 1150   RDW 12.9 08/01/2022 1150    LYMPHSABS 0.2 (L) 08/01/2022 1150   MONOABS 0.5 08/01/2022 1150   EOSABS 0.0 08/01/2022 1150   BASOSABS 0.0 08/01/2022 1150    Pulmonary Functions Testing Results:     No data to display          Outpatient Medications Prior to Visit  Medication Sig Dispense Refill   Acetylcysteine (NAC 600 PO) Take by mouth.     ascorbic acid (VITAMIN C) 500 MG tablet Take by mouth daily.     Cyanocobalamin (VITAMIN B 12 PO) Take by mouth.     FOLIC ACID PO Take by mouth daily.     Krill Oil 500 MG CAPS Take by mouth daily.     MAGNESIUM PO Take by mouth.     NIACIN PO Take by mouth.     Probiotic Product (PROBIOTIC DAILY PO) Take by mouth daily.     SELENIUM PO Take 1 tablet by mouth daily.     Vitamin D-Vitamin K (K2 PLUS D3) [phone] MCG-UNIT TABS Take by mouth.     hydrOXYzine (ATARAX/VISTARIL) 10 MG tablet Take 1 tablet (10 mg total) by mouth 2 (two) times daily as needed for anxiety. 30 tablet 0   nitrofurantoin, macrocrystal-monohydrate, (MACROBID) 100 MG capsule Take 1 capsule (100 mg total) by mouth 2 (two) times daily. 10 capsule 0   No facility-administered medications prior to visit.

## 2022-08-12 NOTE — Patient Instructions (Signed)
Today we discussed the nodules on the CT of the abdomen. The first step will be to take another CT of the chest. I suspect these nodules have been stable from 2005. We will get the repeat and discuss the next steps in clinic. I also ordered an allergy pill for your runny nose.

## 2022-08-17 ENCOUNTER — Ambulatory Visit
Admission: RE | Admit: 2022-08-17 | Discharge: 2022-08-17 | Disposition: A | Discharge status: Home / Self Care | Payer: Medicare Other | Source: Ambulatory Visit | Attending: Primary Care | Admitting: Primary Care

## 2022-08-17 DIAGNOSIS — R911 Solitary pulmonary nodule: Secondary | ICD-10-CM | POA: Insufficient documentation

## 2022-08-17 DIAGNOSIS — R918 Other nonspecific abnormal finding of lung field: Secondary | ICD-10-CM | POA: Diagnosis not present

## 2022-08-19 ENCOUNTER — Ambulatory Visit (INDEPENDENT_AMBULATORY_CARE_PROVIDER_SITE_OTHER): Payer: Medicare Other | Admitting: Student in an Organized Health Care Education/Training Program

## 2022-08-19 ENCOUNTER — Encounter: Payer: Self-pay | Admitting: Student in an Organized Health Care Education/Training Program

## 2022-08-19 VITALS — BP 130/74 | HR 82 | Temp 98.3°F | Ht 62.5 in | Wt 140.0 lb

## 2022-08-19 DIAGNOSIS — J302 Other seasonal allergic rhinitis: Secondary | ICD-10-CM

## 2022-08-19 DIAGNOSIS — R911 Solitary pulmonary nodule: Secondary | ICD-10-CM

## 2022-08-19 NOTE — Patient Instructions (Signed)
Today, I ordered a repeat Chest CT for 6 months from today. I will see you for follow up after that.

## 2022-08-19 NOTE — Progress Notes (Signed)
Synopsis: Follow up for pulmonary nodule.  Assessment & Plan:   1. Lung nodule  Found to have to small nodules on lung images from her abdominal CT, confirmed on repeat CT of the chest performed two days ago. While there is no official radiology read on this image, I did review it and compared it to a CT chest from 2005 (albeit lower quality with 5 mm slices). The peripheral nodules of concern on her current CT were present in 2005 (the lower quality makes it harder to compare size).  Given their presence and relative stability for 18 years, I am less concerned about a malignant process and suspect these are likely previous inflammatory or infectious nodules. I will await the official radiology read on the current CT and order a repeat CT of the chest for 6 months from today.  - CT CHEST WO CONTRAST; Future     2. Seasonal allergic rhinitis   Reports runny nose and symptoms of post nasal drip. Improved on anti-histamine which she will continue.   Return in about 7 months (around 03/20/2023).  I spent 30 minutes caring for this patient today, including preparing to see the patient, obtaining and/or reviewing separately obtained history, performing a medically appropriate examination and/or evaluation, counseling and educating the patient/family/caregiver, ordering medications, tests, or procedures, documenting clinical information in the electronic health record, and independently interpreting results (not separately reported/billed) and communicating results to the patient/family/caregiver  Kayla Reichert, MD [facility] 08/19/2022 11:43 AM    End of visit medications:  No orders of the defined types were placed in this encounter.    Current Outpatient Medications:    Acetylcysteine (NAC 600 PO), Take by mouth., Disp: , Rfl:    ascorbic acid (VITAMIN C) 500 MG tablet, Take by mouth daily., Disp: , Rfl:    Cyanocobalamin (VITAMIN B 12 PO), Take by mouth., Disp: ,  Rfl:    desloratadine (CLARINEX) 5 MG tablet, Take 1 tablet (5 mg total) by mouth daily., Disp: 30 tablet, Rfl: 11   FOLIC ACID PO, Take by mouth daily., Disp: , Rfl:    hydrOXYzine (ATARAX/VISTARIL) 10 MG tablet, Take 1 tablet (10 mg total) by mouth 2 (two) times daily as needed for anxiety., Disp: 30 tablet, Rfl: 0   MAGNESIUM PO, Take by mouth., Disp: , Rfl:    NIACIN PO, Take by mouth., Disp: , Rfl:    Probiotic Product (PROBIOTIC DAILY PO), Take by mouth daily., Disp: , Rfl:    SELENIUM PO, Take 1 tablet by mouth daily., Disp: , Rfl:    Vitamin D-Vitamin K (K2 PLUS D3) [phone] MCG-UNIT TABS, Take by mouth., Disp: , Rfl:    Subjective:   PATIENT ID: Kayla Gallegos GENDER: female DOB: 24-Nov-1950, MRN: [id number]  Chief Complaint  Patient presents with   Follow-up    CT 08/17/2022.    HPI  Kayla Gallegos is a pleasant 71 year old female presenting to clinic for the evaluation of a lung nodule.   She was recently seen in the ED for concern of a UTI where an abdominal CT was notable for two pulmonary nodules. She is referred to [place] for an evaluation and was seen in clinic on 08/12/2022. At that time, I ordered a CT scan of the chest for an evaluation and she is here to follow up.  She remains asymptomatic, with no chest pain, cough, hemoptysis, chest tightness, wheezing, night sweats, or unintentional weight loss. Her rhinorrhea and scratchy ear is  much improved following initiation of second generation anti-histamine (over the counter).  Ancillary information including prior medications, full medical/surgical/family/social histories, and PFTs (when available) are listed below and have been reviewed.   Review of Systems  Constitutional:  Negative for chills, diaphoresis, fever and malaise/fatigue.  HENT:  Positive for congestion (improved). Negative for ear pain, sinus pain and sore throat.   Respiratory:  Negative for cough, hemoptysis, sputum production, shortness of breath, wheezing and  stridor.   Cardiovascular:  Negative for chest pain, leg swelling and PND.  Musculoskeletal:  Negative for myalgias.  Neurological:  Negative for weakness.     Objective:   Vitals:   08/19/22 1016  BP: 130/74  Pulse: 82  Temp: 98.3 F (36.8 C)  TempSrc: Temporal  SpO2: 98%  Weight: 140 lb (63.5 kg)  Height: 5' 2.5" (1.588 m)   98% on RA  BMI Readings from Last 3 Encounters:  08/19/22 25.20 kg/m  08/12/22 24.87 kg/m  07/02/22 24.62 kg/m   Wt Readings from Last 3 Encounters:  08/19/22 140 lb (63.5 kg)  08/12/22 138 lb 3.2 oz (62.7 kg)  07/02/22 139 lb (63 kg)    Physical Exam Vitals and nursing note reviewed.  Constitutional:      Appearance: Normal appearance. She is normal weight.  HENT:     Head: Normocephalic.     Nose: No congestion.     Mouth/Throat:     Mouth: Mucous membranes are moist.  Eyes:     Extraocular Movements: Extraocular movements intact.  Cardiovascular:     Rate and Rhythm: Normal rate and regular rhythm.     Pulses: Normal pulses.     Heart sounds: Normal heart sounds.  Pulmonary:     Effort: Pulmonary effort is normal.     Breath sounds: Normal breath sounds.  Abdominal:     General: Abdomen is flat.     Palpations: Abdomen is soft.  Musculoskeletal:        General: Normal range of motion.     Cervical back: Normal range of motion.  Skin:    General: Skin is warm and dry.  Neurological:     General: No focal deficit present.     Mental Status: She is alert and oriented to person, place, and time. Mental status is at baseline.       Ancillary Information    Past Medical History:  Diagnosis Date   Allergy    Anxiety    Cataract    bil cateracts removed   DIVERTICULOSIS, COLON 05/29/2008   Qualifier: Diagnosis of  By: Jenny Reichmann MD, Hunt Oris    GERD (gastroesophageal reflux disease)    Hyperlipidemia    Kidney stones    Laceration of skin of thigh, right, sequela 08/14/2020   PARESTHESIA 04/01/2009   Qualifier: Diagnosis of   By: Jenny Reichmann MD, Hunt Oris    Recurrent UTI (urinary tract infection)      Family History  Problem Relation Age of Onset   Colon cancer Sister 78   Colon cancer Brother 52   Stomach cancer Neg Hx    Esophageal cancer Neg Hx    Rectal cancer Neg Hx      Past Surgical History:  Procedure Laterality Date   ABDOMINAL HYSTERECTOMY  1976   APPENDECTOMY     BACK SURGERY  08/2018   [place]   COLONOSCOPY     LAPAROSCOPIC CHOLECYSTECTOMY  1990   TONSILLECTOMY     UPPER GASTROINTESTINAL ENDOSCOPY  Social History   Socioeconomic History   Marital status: Married    Spouse name: Not on file   Number of children: Not on file   Years of education: Not on file   Highest education level: Not on file  Occupational History   Not on file  Tobacco Use   Smoking status: Never   Smokeless tobacco: Never  Vaping Use   Vaping Use: Never used  Substance and Sexual Activity   Alcohol use: No   Drug use: No   Sexual activity: Not on file  Other Topics Concern   Not on file  Social History Narrative   Not on file   Social Determinants of Health   Financial Resource Strain: Low Risk  (04/22/2022)   Overall Financial Resource Strain (CARDIA)    Difficulty of Paying Living Expenses: Not hard at all  Food Insecurity: No Food Insecurity (04/22/2022)   Hunger Vital Sign    Worried About Running Out of Food in the Last Year: Never true    [place] in the Last Year: Never true  Transportation Needs: No Transportation Needs (04/22/2022)   PRAPARE - Hydrologist (Medical): No    Lack of Transportation (Non-Medical): No  Physical Activity: Sufficiently Active (04/22/2022)   Exercise Vital Sign    Days of Exercise per Week: 5 days    Minutes of Exercise per Session: 30 min  Stress: No Stress Concern Present (04/22/2022)   [place]    Feeling of Stress : Only a little  Social Connections:  Socially Integrated (04/22/2022)   Social Connection and Isolation Panel [NHANES]    Frequency of Communication with Friends and Family: More than three times a week    Frequency of Social Gatherings with Friends and Family: More than three times a week    Attends Religious Services: 1 to 4 times per year    Active Member of Clubs or Organizations: Yes    Attends Archivist Meetings: 1 to 4 times per year    Marital Status: Married  Human resources officer Violence: Not At Risk (04/22/2022)   Humiliation, Afraid, Rape, and Kick questionnaire    Fear of Current or Ex-Partner: No    Emotionally Abused: No    Physically Abused: No    Sexually Abused: No     Allergies  Allergen Reactions   Codeine Nausea And Vomiting and Nausea Only    dizziness     CBC    Component Value Date/Time   WBC 17.8 (H) 08/01/2022 1150   RBC 4.54 08/01/2022 1150   HGB 13.5 08/01/2022 1150   HCT 40.1 08/01/2022 1150   PLT 203 08/01/2022 1150   MCV 88.3 08/01/2022 1150   MCH 29.7 08/01/2022 1150   MCHC 33.7 08/01/2022 1150   RDW 12.9 08/01/2022 1150   LYMPHSABS 0.2 (L) 08/01/2022 1150   MONOABS 0.5 08/01/2022 1150   EOSABS 0.0 08/01/2022 1150   BASOSABS 0.0 08/01/2022 1150    Pulmonary Functions Testing Results:     No data to display          Outpatient Medications Prior to Visit  Medication Sig Dispense Refill   Acetylcysteine (NAC 600 PO) Take by mouth.     ascorbic acid (VITAMIN C) 500 MG tablet Take by mouth daily.     Cyanocobalamin (VITAMIN B 12 PO) Take by mouth.     desloratadine (CLARINEX) 5 MG tablet Take 1  tablet (5 mg total) by mouth daily. 30 tablet 11   FOLIC ACID PO Take by mouth daily.     hydrOXYzine (ATARAX/VISTARIL) 10 MG tablet Take 1 tablet (10 mg total) by mouth 2 (two) times daily as needed for anxiety. 30 tablet 0   MAGNESIUM PO Take by mouth.     NIACIN PO Take by mouth.     Probiotic Product (PROBIOTIC DAILY PO) Take by mouth daily.     SELENIUM PO Take 1 tablet  by mouth daily.     Vitamin D-Vitamin K (K2 PLUS D3) [phone] MCG-UNIT TABS Take by mouth.     Krill Oil 500 MG CAPS Take by mouth daily.     nitrofurantoin, macrocrystal-monohydrate, (MACROBID) 100 MG capsule Take 1 capsule (100 mg total) by mouth 2 (two) times daily. 10 capsule 0   No facility-administered medications prior to visit.

## 2022-12-03 DIAGNOSIS — M25531 Pain in right wrist: Secondary | ICD-10-CM | POA: Insufficient documentation

## 2022-12-15 DIAGNOSIS — L309 Dermatitis, unspecified: Secondary | ICD-10-CM | POA: Diagnosis not present

## 2022-12-20 ENCOUNTER — Other Ambulatory Visit: Payer: Self-pay

## 2022-12-20 ENCOUNTER — Emergency Department
Admission: EM | Admit: 2022-12-20 | Discharge: 2022-12-21 | Disposition: A | Discharge status: Home / Self Care | Payer: Medicare Other | Attending: Emergency Medicine | Admitting: Emergency Medicine

## 2022-12-20 ENCOUNTER — Ambulatory Visit
Admission: EM | Admit: 2022-12-20 | Discharge: 2022-12-20 | Disposition: A | Discharge status: Home / Self Care | Payer: Medicare Other | Attending: Urgent Care | Admitting: Urgent Care

## 2022-12-20 ENCOUNTER — Encounter: Payer: Self-pay | Admitting: Emergency Medicine

## 2022-12-20 DIAGNOSIS — Z20822 Contact with and (suspected) exposure to covid-19: Secondary | ICD-10-CM | POA: Insufficient documentation

## 2022-12-20 DIAGNOSIS — R110 Nausea: Secondary | ICD-10-CM | POA: Diagnosis not present

## 2022-12-20 DIAGNOSIS — R519 Headache, unspecified: Secondary | ICD-10-CM | POA: Insufficient documentation

## 2022-12-20 DIAGNOSIS — R350 Frequency of micturition: Secondary | ICD-10-CM | POA: Diagnosis present

## 2022-12-20 DIAGNOSIS — N3000 Acute cystitis without hematuria: Secondary | ICD-10-CM | POA: Insufficient documentation

## 2022-12-20 DIAGNOSIS — R300 Dysuria: Secondary | ICD-10-CM

## 2022-12-20 DIAGNOSIS — R509 Fever, unspecified: Secondary | ICD-10-CM | POA: Diagnosis not present

## 2022-12-20 DIAGNOSIS — R531 Weakness: Secondary | ICD-10-CM | POA: Diagnosis not present

## 2022-12-20 DIAGNOSIS — R000 Tachycardia, unspecified: Secondary | ICD-10-CM | POA: Diagnosis not present

## 2022-12-20 DIAGNOSIS — R42 Dizziness and giddiness: Secondary | ICD-10-CM | POA: Diagnosis not present

## 2022-12-20 LAB — COMPREHENSIVE METABOLIC PANEL
ALT: 29 U/L (ref 0–44)
AST: 28 U/L (ref 15–41)
Albumin: 4 g/dL (ref 3.5–5.0)
Alkaline Phosphatase: 64 U/L (ref 38–126)
Anion gap: 6 (ref 5–15)
BUN: 11 mg/dL (ref 8–23)
CO2: 29 mmol/L (ref 22–32)
Calcium: 9.7 mg/dL (ref 8.9–10.3)
Chloride: 102 mmol/L (ref 98–111)
Creatinine, Ser: 0.82 mg/dL (ref 0.44–1.00)
GFR, Estimated: >60 mL/min (ref >60)
Glucose, Bld: 143 mg/dL — ABNORMAL HIGH (ref 70–99)
Potassium: 3.6 mmol/L (ref 3.5–5.1)
Sodium: 137 mmol/L (ref 135–145)
Total Bilirubin: 1.7 mg/dL — ABNORMAL HIGH (ref 0.3–1.2)
Total Protein: 6.8 g/dL (ref 6.5–8.1)

## 2022-12-20 LAB — CBC WITH DIFFERENTIAL/PLATELET
Abs Immature Granulocytes: 0.24 10*3/uL — ABNORMAL HIGH (ref 0.00–0.07)
Basophils Absolute: 0.1 10*3/uL (ref 0.0–0.1)
Basophils Relative: 0 %
Eosinophils Absolute: 0 10*3/uL (ref 0.0–0.5)
Eosinophils Relative: 0 %
HCT: 42 % (ref 36.0–46.0)
Hemoglobin: 14.1 g/dL (ref 12.0–15.0)
Immature Granulocytes: 2 %
Lymphocytes Relative: 3 %
Lymphs Abs: 0.4 10*3/uL — ABNORMAL LOW (ref 0.7–4.0)
MCH: 29.8 pg (ref 26.0–34.0)
MCHC: 33.6 g/dL (ref 30.0–36.0)
MCV: 88.8 fL (ref 80.0–100.0)
Monocytes Absolute: 0.6 10*3/uL (ref 0.1–1.0)
Monocytes Relative: 4 %
Neutro Abs: 14.6 10*3/uL — ABNORMAL HIGH (ref 1.7–7.7)
Neutrophils Relative %: 91 %
Platelets: 246 10*3/uL (ref 150–400)
RBC: 4.73 MIL/uL (ref 3.87–5.11)
RDW: 13.2 % (ref 11.5–15.5)
WBC: 15.9 10*3/uL — ABNORMAL HIGH (ref 4.0–10.5)
nRBC: 0 % (ref 0.0–0.2)

## 2022-12-20 LAB — POCT URINALYSIS DIP (MANUAL ENTRY)
Bilirubin, UA: NEGATIVE
Glucose, UA: NEGATIVE mg/dL
Nitrite, UA: NEGATIVE
Spec Grav, UA: 1.02 (ref 1.010–1.025)
Urobilinogen, UA: 0.2 E.U./dL
pH, UA: 5.5 (ref 5.0–8.0)

## 2022-12-20 LAB — RESP PANEL BY RT-PCR (RSV, FLU A&B, COVID)  RVPGX2
Influenza A by PCR: NEGATIVE
Influenza B by PCR: NEGATIVE
Resp Syncytial Virus by PCR: NEGATIVE
SARS Coronavirus 2 by RT PCR: NEGATIVE

## 2022-12-20 LAB — TROPONIN I (HIGH SENSITIVITY): Troponin I (High Sensitivity): <2 ng/L (ref <18)

## 2022-12-20 MED ORDER — ONDANSETRON HCL 4 MG/2ML IJ SOLN
4.0000 mg | INTRAMUSCULAR | Status: AC
  Administered 2022-12-21: 4 mg via INTRAVENOUS
  Filled 2022-12-20: qty 2

## 2022-12-20 MED ORDER — ACETAMINOPHEN 325 MG PO TABS
650.0000 mg | ORAL_TABLET | Freq: Once | ORAL | Status: AC
  Administered 2022-12-21: 650 mg via ORAL
  Filled 2022-12-20: qty 2

## 2022-12-20 MED ORDER — NITROFURANTOIN MONOHYD MACRO 100 MG PO CAPS: 100.0000 mg | ORAL_CAPSULE | Freq: Two times a day (BID) | ORAL | 0 refills | Status: DC

## 2022-12-20 MED ORDER — SODIUM CHLORIDE 0.9 % IV SOLN: 1.0000 g | INTRAVENOUS | Status: DC

## 2022-12-20 MED ORDER — IBUPROFEN 600 MG PO TABS
600.0000 mg | ORAL_TABLET | Freq: Once | ORAL | Status: AC
  Administered 2022-12-21: 600 mg via ORAL
  Filled 2022-12-20: qty 1

## 2022-12-20 MED ORDER — LACTATED RINGERS IV BOLUS
1000.0000 mL | Freq: Once | INTRAVENOUS | Status: AC
  Administered 2022-12-21: 1000 mL via INTRAVENOUS

## 2022-12-20 NOTE — ED Provider Notes (Signed)
[facility] Provider Note    Event Date/Time   First MD Initiated Contact with Patient 12/20/22 2328     (approximate)   History   Generalized Body Aches   HPI  Kayla Gallegos is a 72 y.o. female   has a history of diverticulosis hyperlipidemia kidney stones recurrent urinary tract infections  Last night patient started to experience frequent urination and she reports that that is a known early symptom of her urinary tract infections.  She went to urgent care today due to urgency and dysuria.  Was prescribed Macrobid.  After taking the first dose of Macrobid she noted some nausea, and then took the second dose at about 2 PM today reports that his when she takes it makes her feel nauseated also associated headache with it.  She continues to have discomfort and frequent urination.  Some fatigue and now some bodyaches as well.  No flank pain.  No nausea vomit except for nausea after she took her Macrobid  No runny nose no cough no shortness of breath    Reviewed urgent care note from today where patient was seen for dysuria and urgency. "UA is positive for leukocytes. No nitrites. Will treat for acute cystitis without hematuria. Also sending culture to verify susceptibility. Treating with Macrobid. "    Physical Exam   Triage Vital Signs: ED Triage Vitals  Enc Vitals Group     BP 12/20/22 2237 (!) 141/76     Pulse Rate 12/20/22 2237 (!) 116     Resp 12/20/22 2237 20     Temp 12/20/22 2237 99.2 F (37.3 C)     Temp Source 12/20/22 2237 Oral     SpO2 12/20/22 2237 100 %     Weight 12/20/22 2238 137 lb (62.1 kg)     Height 12/20/22 2238 '5\' 2"'$  (1.575 m)     Head Circumference --      Peak Flow --      Pain Score 12/20/22 2237 7     Pain Loc --      Pain Edu? --      Excl. in [place]? --     Most recent vital signs: Vitals:   12/21/22 0030 12/21/22 0100  BP: 131/61 127/60  Pulse: 92 96  Resp: 18 18  Temp:    SpO2: 95% 92%     General: Awake,  no distress.  Pleasant.  Well-appearing.  Her daughter at the bedside also very pleasant.  She does not appear in acute distress but appears mildly ill CV:  Good peripheral perfusion.  Heart tones very mild tachycardia rate of approximately 110.  No murmurs or rubs Resp:  Normal effort.  Clear bilaterally speaks full clear sentences Abd:  No distention.  Soft nontender nondistended, some discomfort noted in the suprapubic region only.  I had the patient sit up which she is able to do without difficulty, and she has no tenderness to percussion of the costovertebral angles bilaterally Other:  Warm well-perfused peripherally.  Normal capillary refill no rashes.  No meningismus.   ED Results / Procedures / Treatments   Labs (all labs ordered are listed, but only abnormal results are displayed) Labs Reviewed  CBC WITH DIFFERENTIAL/PLATELET - Abnormal; Notable for the following components:      Result Value   WBC 15.9 (*)    Neutro Abs 14.6 (*)    Lymphs Abs 0.4 (*)    Abs Immature Granulocytes 0.24 (*)    All other  components within normal limits  COMPREHENSIVE METABOLIC PANEL - Abnormal; Notable for the following components:   Glucose, Bld 143 (*)    Total Bilirubin 1.7 (*)    All other components within normal limits  URINALYSIS, ROUTINE W REFLEX MICROSCOPIC - Abnormal; Notable for the following components:   Color, Urine YELLOW (*)    APPearance CLEAR (*)    Hgb urine dipstick SMALL (*)    Ketones, ur 5 (*)    All other components within normal limits  RESP PANEL BY RT-PCR (RSV, FLU A&B, COVID)  RVPGX2  CULTURE, BLOOD (ROUTINE X 2)  CULTURE, BLOOD (ROUTINE X 2)  LACTIC ACID, PLASMA  PROTIME-INR  APTT  LACTIC ACID, PLASMA  TROPONIN I (HIGH SENSITIVITY)  TROPONIN I (HIGH SENSITIVITY)     EKG  And interpreted by me at 2240 heart rate 120 QRS 80 QTc 450 Sinus tachycardia.  No evidence of acute ischemia  RADIOLOGY     PROCEDURES:  Critical Care performed:  No  Procedures   MEDICATIONS ORDERED IN ED: Medications  ibuprofen (ADVIL) tablet 600 mg (600 mg Oral Given 12/21/22 0014)  acetaminophen (TYLENOL) tablet 650 mg (650 mg Oral Given 12/21/22 0014)  lactated ringers bolus 1,000 mL (1,000 mLs Intravenous New Bag/Given 12/21/22 0028)  ondansetron (ZOFRAN) injection 4 mg (4 mg Intravenous Given 12/21/22 0015)  levofloxacin (LEVAQUIN) IVPB 750 mg (0 mg Intravenous Stopped 12/21/22 0216)     IMPRESSION / MDM / [place] / ED COURSE  I reviewed the triage vital signs and the nursing notes.                              Differential diagnosis includes, but is not limited to, possible urinary tract infection, worsening infection, possible development of sepsis, potential side effect of Macrobid as she reports use of Macrobid in the past has caused her nausea and headache though prior to me also thinks this could be symptoms of just simply worsening infection or illness.  She is diagnosed with urinary tract infection has a history of Enterococcus on prior culture.  She has no clinical signs or symptoms that are highly suggestive of a developing pyelonephritis.  She does meet sepsis criteria, plan to evaluate lactic acid provide fluids and hydration antiemetic and antipyretic and reassess.  She does not appear to have evidence of severe sepsis or septic shock by any means.  She is alert well-oriented.  At this point I think she does meet criteria for sepsis but it would be considered fairly mild and thus we were to see elevation of her lactic acid or degradation of vital signs etc.  I do not believe the patient warrants imaging of the abdomen and pelvis at this time.  She has no symptoms of abdominal pain no flank pain, no signs or symptoms of pyelonephritis.  She did notes urinary frequency and dysuria  Patient's presentation is most consistent with acute complicated illness / injury requiring diagnostic workup.   The patient is on the cardiac monitor  to evaluate for evidence of arrhythmia and/or significant heart rate changes.  Clinical Course as of 12/21/22 1275  [facility] Dec 21, 2022  0127 Analysis here appears quite clear.  Small amount of blood.  Patient denies abdominal pain no nausea no vomiting.  She does report though that she has urinary frequency and dysuria.  She started antibiotic already.  Will continue on IV Levaquin and anticipate discharge home with amoxicillin.  Abdomen soft nontender nondistended throughout.  Denies any respiratory symptoms.  No cough.  Vital signs improving heart rate improved resting comfortably at this time. [MQ]  0128 Normal lactic acid suggests against evidence of severe sepsis  [MQ]    Clinical Course User Index [MQ] Delman Kitten, MD   Reviewed prior urine culture (entorococuccus) and selection of abx with Oneita Kras (Pharm D).  Reviewed, discussed and will initiate Levaquin 750 mg IV once with the plan to discharge patient on Augmentin.  Wish to avoid IV Unasyn due to the high incidence of anaphylaxis associated with its use. Patient no known abx allergies.  ----------------------------------------- 2:25 AM on 12/21/2022 -----------------------------------------  Vitals:   12/21/22 0030 12/21/22 0100  BP: 131/61 127/60  Pulse: 92 96  Resp: 18 18  Temp:    SpO2: 95% 92%    Patient resting comfortably.  Workup extremely reassuring to this point all vital signs of normalized.  Patient resting comfortably.  Will discharge with Augmentin and Zofran as needed.  Return precautions discussed.  At this point I do not see any indication for additional workup, her urinalysis here appears quite clean but was diagnosed and has symptoms of urinary tract infection and has already started on antibiotic.  She has a urine culture pending from this morning and blood cultures pending from today.  Her lactic acid is normal her vital signs have normalized she is in no acute distress and I think appropriate for ongoing  outpatient workup.  Certainly if her blood culture is positive then she will need to return for additional treatment, but at this point I suspect her symptoms are urinary in nature and possibly some of her symptoms may be a side effect of potential Macrobid as she reports same occurring when she took Macrobid once before.  Return precautions and treatment recommendations and follow-up discussed with the patient who is agreeable with the plan.   FINAL CLINICAL IMPRESSION(S) / ED DIAGNOSES   Final diagnoses:  Acute cystitis without hematuria     Rx / DC Orders   ED Discharge Orders          Ordered    amoxicillin-clavulanate (AUGMENTIN) 875-125 MG tablet  2 times daily        12/21/22 0224    ondansetron (ZOFRAN-ODT) 4 MG disintegrating tablet  Every 6 hours PRN        12/21/22 0224             Note:  This document was prepared using Dragon voice recognition software and may include unintentional dictation errors.   Delman Kitten, MD 12/21/22 [phone]

## 2022-12-20 NOTE — ED Triage Notes (Signed)
Pt presents with dysuria and urinary frequency x 5 days. Pt states her urine dips are typically negative then her culture shows positive.

## 2022-12-20 NOTE — ED Triage Notes (Signed)
Patient arrived via POV with daughter. States she was started on MacroBid  this morning for UTI and began experiencing headache, body aches, nausea, dizziness and generalized weakness. States she had similar symptoms last time she was started on this medication. Patient appears drowsy, holding head with eyes closed. Resp even, unlabored on RA.

## 2022-12-20 NOTE — Discharge Instructions (Signed)
Follow up here or with your primary care provider if your symptoms are worsening or not improving with treatment.     

## 2022-12-20 NOTE — ED Provider Notes (Signed)
Roderic Palau    CSN: [id number] Arrival date & time: 12/20/22  8127      History   Chief Complaint Chief Complaint  Patient presents with   Dysuria   Urinary Frequency    HPI Kayla Gallegos is a 72 y.o. female.    Dysuria Urinary Frequency    Patient presents to urgent care with complaint of dysuria and frequency x 5 days.  Denies fever, body aches, chills.  Denies flank pain.  Endorses some abdominal pain last night.  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/05/2020   ETD (Eustachian tube dysfunction), bilateral 12/27/2019   Hearing loss of left ear due to cerumen impaction 12/27/2019   TMJ tenderness, 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   Lumbar radiculopathy 06/27/2018   Vaginal dryness 10/18/2017   Recurrent UTI 09/30/2016   Encounter for annual general medical examination with abnormal findings in adult 09/30/2016   HEMATURIA UNSPECIFIED 05/30/2008   Hyperlipidemia 05/29/2008   GAD (generalized anxiety disorder) 05/29/2008   Allergic rhinitis 05/29/2008   GERD 05/29/2008   IBS 05/29/2008   INSOMNIA-SLEEP DISORDER-UNSPEC 05/29/2008    Past Surgical History:  Procedure Laterality Date   [place] SURGERY  08/2018   [place]   COLONOSCOPY     LAPAROSCOPIC CHOLECYSTECTOMY  1990   TONSILLECTOMY     UPPER GASTROINTESTINAL ENDOSCOPY      OB History   No  obstetric history on file.      Home Medications    Prior to Admission medications   Medication Sig Start Date End Date Taking? Authorizing Provider  Acetylcysteine (NAC 600 PO) Take by mouth.    [provider]  ascorbic acid (VITAMIN C) 500 MG tablet Take by mouth daily.    [provider]  Cyanocobalamin (VITAMIN B 12 PO) Take by mouth.    [provider]  desloratadine (CLARINEX) 5 MG tablet Take 1 tablet (5 mg total) by mouth daily. 08/12/22 08/12/23  Armando Reichert, MD  FOLIC ACID PO Take by mouth daily.    [provider]  hydrOXYzine (ATARAX/VISTARIL) 10 MG tablet Take 1 tablet (10 mg total) by mouth 2 (two) times daily as needed for anxiety. 08/24/21   Pleas Koch, NP  MAGNESIUM PO Take by mouth.    [provider]  NIACIN PO Take by mouth.    [provider]  Probiotic Product (PROBIOTIC DAILY PO) Take by mouth daily.    [provider]  SELENIUM PO Take 1 tablet by mouth daily.    [provider]  Vitamin D-Vitamin K (K2 PLUS D3) [phone] MCG-UNIT TABS Take by mouth.    [provider]    Family History Family History  Problem Relation Age of Onset   Colon cancer Sister 34  Colon cancer Brother 48   Stomach cancer Neg Hx    Esophageal cancer Neg Hx    Rectal cancer Neg Hx     Social History Social History   Tobacco Use   Smoking status: Never   Smokeless tobacco: Never  Vaping Use   Vaping Use: Never used  Substance Use Topics   Alcohol use: No   Drug use: No     Allergies   Codeine   Review of Systems Review of Systems  Genitourinary:  Positive for dysuria and frequency.     Physical Exam Triage Vital Signs ED Triage Vitals  Enc Vitals Group     BP 12/20/22 0909 (!) 149/76     Pulse Rate 12/20/22 0909 85     Resp 12/20/22 0909 16     Temp 12/20/22 0909 98 F (36.7 C)     Temp Source 12/20/22 0909 Oral     SpO2 12/20/22 0909 96 %     Weight --       Height --      Head Circumference --      Peak Flow --      Pain Score 12/20/22 0908 0     Pain Loc --      Pain Edu? --      Excl. in [place]? --    No data found.  Updated Vital Signs BP (!) 149/76 (BP Location: Left Arm)   Pulse 85   Temp 98 F (36.7 C) (Oral)   Resp 16   SpO2 96%   Visual Acuity Right Eye Distance:   Left Eye Distance:   Bilateral Distance:    Right Eye Near:   Left Eye Near:    Bilateral Near:     Physical Exam Vitals reviewed.  Constitutional:      Appearance: Normal appearance.  Skin:    General: Skin is warm and dry.  Neurological:     General: No focal deficit present.     Mental Status: She is alert and oriented to person, place, and time.  Psychiatric:        Mood and Affect: Mood normal.        Behavior: Behavior normal.      UC Treatments / Results  Labs (all labs ordered are listed, but only abnormal results are displayed) Labs Reviewed - No data to display  EKG   Radiology No results found.  Procedures Procedures (including critical care time)  Medications Ordered in UC Medications - No data to display  Initial Impression / Assessment and Plan / UC Course  I have reviewed the triage vital signs and the nursing notes.  Pertinent labs & imaging results that were available during my care of the patient were reviewed by me and considered in my medical decision making (see chart for details).   Patient is afebrile here without recent antipyretics. Satting well on room air. Overall is well appearing, well hydrated, without respiratory distress.   UA is positive for leukocytes.  No nitrites.  Will treat for acute cystitis without hematuria.  Also sending culture to verify susceptibility.  Treating with Macrobid.   Final Clinical Impressions(s) / UC Diagnoses   Final diagnoses:  None   Discharge Instructions   None    ED Prescriptions   None    PDMP not reviewed this encounter.   Rose Phi, [place] 12/20/22  [phone]

## 2022-12-20 NOTE — ED Provider Notes (Incomplete)
[facility] Provider Note    Event Date/Time   First MD Initiated Contact with Patient 12/20/22 2328     (approximate)   History   Generalized Body Aches   HPI  Kayla Gallegos is a 72 y.o. female   has a history of diverticulosis hyperlipidemia kidney stones recurrent urinary tract infections  Last night patient started to experience frequent urination and she reports that that is a known early symptom of her urinary tract infections.  She went to urgent care today due to urgency and dysuria.  Was prescribed Macrobid.  After taking the first dose of Macrobid she noted some nausea, and then took the second dose at about 2 PM today reports that his when she takes it makes her feel nauseated also associated headache with it.  She continues to have discomfort and frequent urination.  Some fatigue and now some bodyaches as well.      Reviewed urgent care note from today where patient was seen for dysuria and urgency. "UA is positive for leukocytes. No nitrites. Will treat for acute cystitis without hematuria. Also sending culture to verify susceptibility. Treating with Macrobid. "    Physical Exam   Triage Vital Signs: ED Triage Vitals  Enc Vitals Group     BP 12/20/22 2237 (!) 141/76     Pulse Rate 12/20/22 2237 (!) 116     Resp 12/20/22 2237 20     Temp 12/20/22 2237 99.2 F (37.3 C)     Temp Source 12/20/22 2237 Oral     SpO2 12/20/22 2237 100 %     Weight 12/20/22 2238 137 lb (62.1 kg)     Height 12/20/22 2238 '5\' 2"'$  (1.575 m)     Head Circumference --      Peak Flow --      Pain Score 12/20/22 2237 7     Pain Loc --      Pain Edu? --      Excl. in [place]? --     Most recent vital signs: Vitals:   12/20/22 2237  BP: (!) 141/76  Pulse: (!) 116  Resp: 20  Temp: 99.2 F (37.3 C)  SpO2: 100%    {Only need to document appropriate and relevant physical exam:1} General: Awake, no distress. *** CV:  Good peripheral perfusion.  *** Resp:  Normal effort. *** Abd:  No distention. *** Other:  ***   ED Results / Procedures / Treatments   Labs (all labs ordered are listed, but only abnormal results are displayed) Labs Reviewed  CBC WITH DIFFERENTIAL/PLATELET - Abnormal; Notable for the following components:      Result Value   WBC 15.9 (*)    Neutro Abs 14.6 (*)    Lymphs Abs 0.4 (*)    Abs Immature Granulocytes 0.24 (*)    All other components within normal limits  COMPREHENSIVE METABOLIC PANEL - Abnormal; Notable for the following components:   Glucose, Bld 143 (*)    Total Bilirubin 1.7 (*)    All other components within normal limits  RESP PANEL BY RT-PCR (RSV, FLU A&B, COVID)  RVPGX2  URINALYSIS, ROUTINE W REFLEX MICROSCOPIC  TROPONIN I (HIGH SENSITIVITY)     EKG  ***   RADIOLOGY *** {USE THE WORD "INTERPRETED"!! You MUST document your own interpretation of imaging, as well as the fact that you reviewed the radiologist's report!:1}   PROCEDURES:  Critical Care performed: {CriticalCareYesNo:19197::"Yes, see critical care procedure note(s)","No"}  Procedures   MEDICATIONS  ORDERED IN ED: Medications - No data to display   IMPRESSION / MDM / [place] / ED COURSE  I reviewed the triage vital signs and the nursing notes.                              Differential diagnosis includes, but is not limited to, ***  Patient's presentation is most consistent with {EM COPA:27473}  *** {If the patient is on the monitor, remove the brackets and asterisks on the sentence below and remember to document it as a Procedure as well. Otherwise delete the sentence below:1} {**The patient is on the cardiac monitor to evaluate for evidence of arrhythmia and/or significant heart rate changes.**} {Remember to include, when applicable, any/all of the following data: independent review of imaging independent review of labs (comment specifically on pertinent positives and negatives) review of  specific prior hospitalizations, PCP/specialist notes, etc. discuss meds given and prescribed document any discussion with consultants (including hospitalists) any clinical decision tools you used and why (PECARN, NEXUS, etc.) did you consider admitting the patient? document social determinants of health affecting patient's care (homelessness, inability to follow up in a timely fashion, etc) document any pre-existing conditions increasing risk on current visit (e.g. diabetes and HTN increasing danger of high-risk chest pain/ACS) describes what meds you gave (especially parenteral) and why any other interventions?:1}   Reviewed prior urine culture (entorococuccus) and selection of abx with Oneita Kras (Pharm D).  FINAL CLINICAL IMPRESSION(S) / ED DIAGNOSES   Final diagnoses:  None     Rx / DC Orders   ED Discharge Orders     None        Note:  This document was prepared using Dragon voice recognition software and may include unintentional dictation errors.

## 2022-12-21 DIAGNOSIS — N3000 Acute cystitis without hematuria: Secondary | ICD-10-CM | POA: Diagnosis not present

## 2022-12-21 LAB — URINALYSIS, ROUTINE W REFLEX MICROSCOPIC
Bacteria, UA: NONE SEEN
Bilirubin Urine: NEGATIVE
Glucose, UA: NEGATIVE mg/dL
Ketones, ur: 5 mg/dL — AB
Leukocytes,Ua: NEGATIVE
Nitrite: NEGATIVE
Protein, ur: NEGATIVE mg/dL
Specific Gravity, Urine: 1.015 (ref 1.005–1.030)
pH: 6 (ref 5.0–8.0)

## 2022-12-21 LAB — URINE CULTURE: Culture: NO GROWTH

## 2022-12-21 LAB — TROPONIN I (HIGH SENSITIVITY): Troponin I (High Sensitivity): 3 ng/L (ref <18)

## 2022-12-21 LAB — PROTIME-INR
INR: 1.1 (ref 0.8–1.2)
Prothrombin Time: 14.5 seconds (ref 11.4–15.2)

## 2022-12-21 LAB — APTT: aPTT: 33 seconds (ref 24–36)

## 2022-12-21 LAB — LACTIC ACID, PLASMA: Lactic Acid, Venous: 1.3 mmol/L (ref 0.5–1.9)

## 2022-12-21 MED ORDER — ONDANSETRON 4 MG PO TBDP: 4.0000 mg | ORAL_TABLET | Freq: Four times a day (QID) | ORAL | 0 refills | Status: DC | PRN

## 2022-12-21 MED ORDER — AMOXICILLIN-POT CLAVULANATE 875-125 MG PO TABS: 1.0000 | ORAL_TABLET | Freq: Two times a day (BID) | ORAL | 0 refills | Status: AC

## 2022-12-21 MED ORDER — LEVOFLOXACIN IN D5W 750 MG/150ML IV SOLN
750.0000 mg | Freq: Once | INTRAVENOUS | Status: AC
  Administered 2022-12-21: 750 mg via INTRAVENOUS
  Filled 2022-12-21: qty 150

## 2022-12-21 NOTE — Discharge Instructions (Signed)
Please discontinue use of Macrobid.  We will switch you to Augmentin.  Return to the ER right away if you develop weakness, vomiting, severe pain, feel you are worsening, or other new concerns or concerning symptoms arise.  Please make a follow-up appointment with your doctor within the next 24 to 48 hours for reassessment.

## 2022-12-22 ENCOUNTER — Encounter (HOSPITAL_COMMUNITY): Payer: Self-pay | Admitting: Emergency Medicine

## 2022-12-24 ENCOUNTER — Encounter: Payer: Self-pay | Admitting: Primary Care

## 2022-12-24 ENCOUNTER — Ambulatory Visit (INDEPENDENT_AMBULATORY_CARE_PROVIDER_SITE_OTHER): Payer: Medicare Other | Admitting: Primary Care

## 2022-12-24 VITALS — BP 134/72 | HR 76 | Temp 98.0°F | Ht 62.0 in | Wt 139.0 lb

## 2022-12-24 DIAGNOSIS — E785 Hyperlipidemia, unspecified: Secondary | ICD-10-CM

## 2022-12-24 DIAGNOSIS — Z87442 Personal history of urinary calculi: Secondary | ICD-10-CM | POA: Diagnosis not present

## 2022-12-24 DIAGNOSIS — R102 Pelvic and perineal pain: Secondary | ICD-10-CM | POA: Diagnosis not present

## 2022-12-24 LAB — CBC WITH DIFFERENTIAL/PLATELET
Basophils Absolute: 0 10*3/uL (ref 0.0–0.1)
Basophils Relative: 0.5 % (ref 0.0–3.0)
Eosinophils Absolute: 0.1 10*3/uL (ref 0.0–0.7)
Eosinophils Relative: 1.9 % (ref 0.0–5.0)
HCT: 40.3 % (ref 36.0–46.0)
Hemoglobin: 13.8 g/dL (ref 12.0–15.0)
Lymphocytes Relative: 30.4 % (ref 12.0–46.0)
Lymphs Abs: 1.8 10*3/uL (ref 0.7–4.0)
MCHC: 34.4 g/dL (ref 30.0–36.0)
MCV: 88.6 fl (ref 78.0–100.0)
Monocytes Absolute: 0.4 10*3/uL (ref 0.1–1.0)
Monocytes Relative: 7.3 % (ref 3.0–12.0)
Neutro Abs: 3.5 10*3/uL (ref 1.4–7.7)
Neutrophils Relative %: 59.9 % (ref 43.0–77.0)
Platelets: 282 10*3/uL (ref 150.0–400.0)
RBC: 4.54 Mil/uL (ref 3.87–5.11)
RDW: 13.6 % (ref 11.5–15.5)
WBC: 5.8 10*3/uL (ref 4.0–10.5)

## 2022-12-24 LAB — LIPID PANEL
Cholesterol: 248 mg/dL — ABNORMAL HIGH (ref 0–200)
HDL: 40.6 mg/dL (ref >39.00)
LDL Cholesterol: 183 mg/dL — ABNORMAL HIGH (ref 0–99)
NonHDL: 207.36
Total CHOL/HDL Ratio: 6
Triglycerides: 124 mg/dL (ref 0.0–149.0)
VLDL: 24.8 mg/dL (ref 0.0–40.0)

## 2022-12-24 MED ORDER — TAMSULOSIN HCL 0.4 MG PO CAPS: 0.4000 mg | ORAL_CAPSULE | Freq: Every day | ORAL | 0 refills | Status: DC

## 2022-12-24 NOTE — Assessment & Plan Note (Signed)
Exam today stable. Reviewed ED and urgent care notes from February 2024.  Do not suspect she had an actual UTI, especially given negative urine culture.  Repeat CBC with diff. Ordered and pending. Trial of Flomax 0.4 mg daily PRN sent to pharmacy. She does have a clear history of renal stones.   She will update.

## 2022-12-24 NOTE — Progress Notes (Signed)
Subjective:    Patient ID: Kayla Gallegos, female    DOB: 13-Oct-1951, 72 y.o.   MRN: [id number]  HPI  Kayla Gallegos is a very pleasant 72 y.o. female with a history of prediabetes, GAD, hyperlipidemia cystitis who presents today for ED follow up.  She would also like her cholesterol re-checked today.   She presented to Urgent Care in [place] on 12/20/22 for a five day history of dysuria and urinary frequency. She did notice abdominal pain on 12/19/22. Urinalysis was collected which revealed leukocytes without nitrites and blood. She was treated with Macrobid course BID x  5 days and discharged home later that day.  She presented to [facility] ED on 12/20/22 for nausea after taking her first dose of Macrobid that was prescribed earlier. She took her second dose and felt nauseated with a headache. She continued to experience urinary frequency, fatigue, and now body aches.   During her stay in the ED labs were negative for severe sepsis. UA revealed small amount of blood, negative leuks. She was treated with IV Levaquin. Her Macrobid was discontinued and she was discharged home later with a prescription for Augmentin BID x 7 days.  Since her ED visit she's feeling better. Her fatigue has resolved. She denies urinary frequency and dysuria. She is compliant to her Augmentin BID.   She believes she has a renal stone. She does notice suprapubic discomfort. She has a history of renal stones, has passed a few stones about 1 year ago. He denies hematuria, nausea, vomiting.    Review of Systems  Constitutional:  Negative for fever.  Gastrointestinal:  Negative for abdominal pain.  Genitourinary:  Positive for pelvic pain. Negative for dysuria, frequency and hematuria.         Past Medical History:  Diagnosis Date   Allergy    Anxiety    Cataract    bil cateracts removed   DIVERTICULOSIS, COLON 05/29/2008   Qualifier: Diagnosis of  By: Jenny Reichmann MD, Hunt Oris    GERD (gastroesophageal reflux  disease)    Hyperlipidemia    Kidney stones    Laceration of skin of thigh, right, sequela 08/14/2020   PARESTHESIA 04/01/2009   Qualifier: Diagnosis of  By: Jenny Reichmann MD, Hunt Oris    Recurrent UTI (urinary tract infection)     Social History   Socioeconomic History   Marital status: Married    Spouse name: Not on file   Number of children: Not on file   Years of education: Not on file   Highest education level: Not on file  Occupational History   Not on file  Tobacco Use   Smoking status: Never   Smokeless tobacco: Never  Vaping Use   Vaping Use: Never used  Substance and Sexual Activity   Alcohol use: No   Drug use: No   Sexual activity: Not on file  Other Topics Concern   Not on file  Social History Narrative   Not on file   Social Determinants of Health   Financial Resource Strain: Low Risk  (04/22/2022)   Overall Financial Resource Strain (CARDIA)    Difficulty of Paying Living Expenses: Not hard at all  Food Insecurity: No Food Insecurity (04/22/2022)   Hunger Vital Sign    Worried About Running Out of Food in the Last Year: Never true    Ran Out of Food in the Last Year: Never true  Transportation Needs: No Transportation Needs (04/22/2022)   PRAPARE - Transportation  Lack of Transportation (Medical): No    Lack of Transportation (Non-Medical): No  Physical Activity: Sufficiently Active (04/22/2022)   Exercise Vital Sign    Days of Exercise per Week: 5 days    Minutes of Exercise per Session: 30 min  Stress: No Stress Concern Present (04/22/2022)   [employer]    Feeling of Stress : Only a little  Social Connections: Socially Integrated (04/22/2022)   Social Connection and Isolation Panel [NHANES]    Frequency of Communication with Friends and Family: More than three times a week    Frequency of Social Gatherings with Friends and Family: More than three times a week    Attends Religious Services: 1 to 4  times per year    Active Member of [employer] or Organizations: Yes    Attends Archivist Meetings: 1 to 4 times per year    Marital Status: Married  Human resources officer Violence: Not At Risk (04/22/2022)   Humiliation, Afraid, Rape, and Kick questionnaire    Fear of Current or Ex-Partner: No    Emotionally Abused: No    Physically Abused: No    Sexually Abused: No    Past Surgical History:  Procedure Laterality Date   [place]  08/2018   [facility] [place]   COLONOSCOPY     LAPAROSCOPIC CHOLECYSTECTOMY  1990   TONSILLECTOMY     UPPER GASTROINTESTINAL ENDOSCOPY      Family History  Problem Relation Age of Onset   Colon cancer Sister 64   Colon cancer Brother 46   Stomach cancer Neg Hx    Esophageal cancer Neg Hx    Rectal cancer Neg Hx     Allergies  Allergen Reactions   Codeine Nausea And Vomiting and Nausea Only    dizziness   Macrobid [Nitrofurantoin] Other (See Comments)    Elevated HR, Elevated BP, Headache, chills    Current Outpatient Medications on File Prior to Visit  Medication Sig Dispense Refill   Acetylcysteine (NAC 600 PO) Take by mouth.     amoxicillin-clavulanate (AUGMENTIN) 875-125 MG tablet Take 1 tablet by mouth 2 (two) times daily for 7 days. 14 tablet 0   ascorbic acid (VITAMIN C) 500 MG tablet Take by mouth daily.     Cyanocobalamin (VITAMIN B 12 PO) Take by mouth.     FOLIC ACID PO Take by mouth daily.     Krill Oil (OMEGA-3) 500 MG CAPS Take by mouth.     Magnesium Carbonate, Antacid, (MAGNESIUM CARBONATE PO) Take by mouth.     MAGNESIUM PO Take by mouth.     NIACIN PO Take by mouth.     Probiotic Product (PROBIOTIC DAILY PO) Take by mouth daily.     SELENIUM PO Take 1 tablet by mouth daily.     Vitamin D-Vitamin K (K2 PLUS D3) [phone] MCG-UNIT TABS Take by mouth.     desloratadine (CLARINEX) 5 MG tablet Take 1 tablet (5 mg total) by mouth daily. (Patient not taking: Reported on 12/24/2022) 30  tablet 11   hydrOXYzine (ATARAX/VISTARIL) 10 MG tablet Take 1 tablet (10 mg total) by mouth 2 (two) times daily as needed for anxiety. (Patient not taking: Reported on 12/24/2022) 30 tablet 0   ondansetron (ZOFRAN-ODT) 4 MG disintegrating tablet Take 1 tablet (4 mg total) by mouth every 6 (six) hours as needed for nausea or vomiting. (Patient not taking: Reported on  12/24/2022) 20 tablet 0   No current facility-administered medications on file prior to visit.    BP 134/72   Pulse 76   Temp 98 F (36.7 C) (Temporal)   Ht '5\' 2"'$  (1.575 m)   Wt 139 lb (63 kg)   SpO2 98%   BMI 25.42 kg/m  Objective:   Physical Exam Cardiovascular:     Rate and Rhythm: Normal rate and regular rhythm.  Pulmonary:     Effort: Pulmonary effort is normal.     Breath sounds: Normal breath sounds.  Abdominal:     Tenderness: There is no right CVA tenderness or left CVA tenderness.  Musculoskeletal:     Cervical back: Neck supple.  Skin:    General: Skin is warm and dry.           Assessment & Plan:  Suprapubic pain Assessment & Plan: Exam today stable. Reviewed ED and urgent care notes from February 2024.  Do not suspect she had an actual UTI, especially given negative urine culture.  Repeat CBC with diff. Ordered and pending. Trial of Flomax 0.4 mg daily PRN sent to pharmacy. She does have a clear history of renal stones.   She will update.   Orders: -     CBC with Differential/Platelet -     Tamsulosin HCl; Take 1 capsule (0.4 mg total) by mouth daily. For kidney stone  Dispense: 30 capsule; Refill: 0  Hyperlipidemia, unspecified hyperlipidemia type Assessment & Plan: Repeat lipid panel pending.    Orders: -     Lipid panel  History of renal stone -     Tamsulosin HCl; Take 1 capsule (0.4 mg total) by mouth daily. For kidney stone  Dispense: 30 capsule; Refill: 0        Pleas Koch, NP

## 2022-12-24 NOTE — Assessment & Plan Note (Signed)
Repeat lipid panel pending. 

## 2022-12-24 NOTE — Patient Instructions (Signed)
Start Flomax 0.4 mg once daily as needed for potential kidney stone.  Stop by the lab prior to leaving today. I will notify you of your results once received.   It was a pleasure to see you today!

## 2022-12-26 LAB — CULTURE, BLOOD (ROUTINE X 2)
Culture: NO GROWTH
Culture: NO GROWTH
Special Requests: ADEQUATE
Special Requests: ADEQUATE

## 2022-12-28 DIAGNOSIS — Z9841 Cataract extraction status, right eye: Secondary | ICD-10-CM | POA: Diagnosis not present

## 2022-12-28 DIAGNOSIS — Z9842 Cataract extraction status, left eye: Secondary | ICD-10-CM | POA: Diagnosis not present

## 2022-12-28 DIAGNOSIS — Z961 Presence of intraocular lens: Secondary | ICD-10-CM | POA: Diagnosis not present

## 2022-12-28 DIAGNOSIS — H52213 Irregular astigmatism, bilateral: Secondary | ICD-10-CM | POA: Diagnosis not present

## 2022-12-30 ENCOUNTER — Telehealth: Payer: Self-pay

## 2022-12-30 NOTE — Telephone Encounter (Signed)
     Patient  visit on 2/5  at [place] Have you been able to follow up with your primary care physician? Yes   The patient was or was not able to obtain any needed medicine or equipment. Yes   Are there diet recommendations that you are having difficulty following? Na   Patient expresses understanding of discharge instructions and education provided has no other needs at this time.  Yes      [facility] [phone] [address], [place], [place] [address] Phone: [phone] Email: [email or web address]

## 2023-01-05 ENCOUNTER — Ambulatory Visit
Admission: RE | Admit: 2023-01-05 | Discharge: 2023-01-05 | Disposition: A | Discharge status: Home / Self Care | Payer: Self-pay | Source: Ambulatory Visit | Attending: Orthopedic Surgery | Admitting: Orthopedic Surgery

## 2023-01-05 ENCOUNTER — Other Ambulatory Visit: Payer: Self-pay

## 2023-01-05 DIAGNOSIS — Z049 Encounter for examination and observation for unspecified reason: Secondary | ICD-10-CM

## 2023-01-05 NOTE — Progress Notes (Signed)
Referring Physician:  No referring provider defined for this encounter.  Primary Physician:  Pleas Koch, NP  History of Present Illness: 01/05/2023 Kayla Gallegos has a history of GERD, IBS, hyperlipidemia.   She has history of MIS left L4-L5 laminectomy/foraminotomy in 2019 at [facility]. She improved after surgery, but looks like she started having increased back and left leg pain about a year later. She started HEP and this improved.   She has 6 week history of intermittent LBP with intermittent right lateral leg pain to her foot. Pain in right leg wakes her up at night and feels like cramping- this improves with walking.  She also has left knee pain that is worse with standing and walking. Occasional numbness/tingling in left leg. Minimal weakness in legs. She is limping due to left knee pain.   Some relief with motrin.   Bowel/Bladder Dysfunction: none  Conservative measures:  Physical therapy: no recent  Multimodal medical therapy including regular antiinflammatories: naproxen , Ibuprofen over the counter Injections: no recent epidural steroid injections  Past Surgery:  MIS left L4-L5 laminectomy/foraminotomy in 2019 at [place] has no symptoms of cervical myelopathy.  The symptoms are causing a significant impact on the patient's life.   Review of Systems:  A 10 point review of systems is negative, except for the pertinent positives and negatives detailed in the HPI.  Past Medical History: Past Medical History:  Diagnosis Date   Allergy    Anxiety    Cataract    bil cateracts removed   DIVERTICULOSIS, COLON 05/29/2008   Qualifier: Diagnosis of  By: Jenny Reichmann MD, Hunt Oris    GERD (gastroesophageal reflux disease)    Hyperlipidemia    Kidney stones    Laceration of skin of thigh, right, sequela 08/14/2020   PARESTHESIA 04/01/2009   Qualifier: Diagnosis of  By: Jenny Reichmann MD, Hunt Oris    Recurrent UTI (urinary tract infection)     Past Surgical History: Past  Surgical History:  Procedure Laterality Date   ABDOMINAL HYSTERECTOMY  1976   APPENDECTOMY     BACK SURGERY  08/2018   [place]   COLONOSCOPY     LAPAROSCOPIC CHOLECYSTECTOMY  1990   TONSILLECTOMY     UPPER GASTROINTESTINAL ENDOSCOPY      Allergies: Allergies as of 01/08/2023 - Review Complete 12/24/2022  Allergen Reaction Noted   Codeine Nausea And Vomiting and Nausea Only 11/17/2012   Macrobid [nitrofurantoin] Other (See Comments) 12/22/2022    Medications: Outpatient Encounter Medications as of 01/08/2023  Medication Sig   Acetylcysteine (NAC 600 PO) Take by mouth.   ascorbic acid (VITAMIN C) 500 MG tablet Take by mouth daily.   Cyanocobalamin (VITAMIN B 12 PO) Take by mouth.   desloratadine (CLARINEX) 5 MG tablet Take 1 tablet (5 mg total) by mouth daily. (Patient not taking: Reported on [id number])   FOLIC ACID PO Take by mouth daily.   hydrOXYzine (ATARAX/VISTARIL) 10 MG tablet Take 1 tablet (10 mg total) by mouth 2 (two) times daily as needed for anxiety. (Patient not taking: Reported on 12/24/2022)   Krill Oil (OMEGA-3) 500 MG CAPS Take by mouth.   Magnesium Carbonate, Antacid, (MAGNESIUM CARBONATE PO) Take by mouth.   MAGNESIUM PO Take by mouth.   NIACIN PO Take by mouth.   ondansetron (ZOFRAN-ODT) 4 MG disintegrating tablet Take 1 tablet (4 mg total) by mouth every 6 (six) hours as needed for nausea or vomiting. (Patient not taking: Reported on 12/24/2022)  Probiotic Product (PROBIOTIC DAILY PO) Take by mouth daily.   SELENIUM PO Take 1 tablet by mouth daily.   tamsulosin (FLOMAX) 0.4 MG CAPS capsule Take 1 capsule (0.4 mg total) by mouth daily. For kidney stone   Vitamin D-Vitamin K (K2 PLUS D3) [phone] MCG-UNIT TABS Take by mouth.   No facility-administered encounter medications on file as of 01/08/2023.    Social History: Social History   Tobacco Use   Smoking status: Never   Smokeless tobacco: Never  Vaping Use   Vaping Use: Never used  Substance Use Topics    Alcohol use: No   Drug use: No    Family Medical History: Family History  Problem Relation Age of Onset   Colon cancer Sister 78   Colon cancer Brother 46   Stomach cancer Neg Hx    Esophageal cancer Neg Hx    Rectal cancer Neg Hx     Physical Examination: There were no vitals filed for this visit.  General: Patient is well developed, well nourished, calm, collected, and in no apparent distress. Attention to examination is appropriate.  Respiratory: Patient is breathing without any difficulty.   NEUROLOGICAL:     Awake, alert, oriented to person, place, and time.  Speech is clear and fluent. Fund of knowledge is appropriate.   Cranial Nerves: Pupils equal round and reactive to light.  Facial tone is symmetric.    Limited ROM of lumbar spine without pain No posterior lumbar tenderness. Well healed lumbar incision.   No abnormal lesions on exposed skin.   Strength: Side Biceps Triceps Deltoid Interossei Grip Wrist Ext. Wrist Flex.  R '5 5 5 5 5 5 5  '$ L '5 5 5 5 5 5 5   '$ Side Iliopsoas Quads Hamstring PF DF EHL  R '5 5 5 5 5 5  '$ L '5 5 5 5 5 5   '$ Reflexes are 2+ and symmetric at the biceps, triceps, brachioradialis, patella and achilles.   Hoffman's is absent.  Clonus is not present.   Bilateral upper and lower extremity sensation is intact to light touch.     She has medial and lateral joint line tenderness left knee with painful ROM.   Gait is normal.     Medical Decision Making  Imaging: Lumbar xrays dated 10/24/21:  Findings:    No fracture or listhesis. No evidence of dynamic instability with flexion or extension.   There is multilevel degenerative disc disease and facet arthropathy. These findings are relatively uniform throughout the lumbar spine, slightly more pronounced at L5-S1.   SI joints are unremarkable. Post cholecystectomy. Moderate stool burden.   IMPRESSION:   - No fracture or listhesis.   - No evidence of dynamic instability.   -  Multilevel degenerative disc disease and facet arthropathy, slightly more pronounced at L5-S1.   I have personally reviewed the images and agree with the above interpretation.   Assessment and Plan: Kayla Gallegos is a pleasant 72 y.o. female has a history of MIS left L4-L5 laminectomy/foraminotomy in 2019 at [facility]. She improved after surgery, but looks like she started having increased back and left leg pain about a year later. She started HEP and this improved.   Now with a 6 week history of intermittent LBP with intermittent right lateral leg pain to her foot. Pain in right leg wakes her up at night and feels like cramping- this improves with walking.  She also has left knee pain that is worse with standing and walking.  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 Her LBP and right leg pain are likely lumbar mediated. Left knee pain appears knee mediated.   Treatment options discussed with patient and following plan made:   - Recommended updated lumbar xrays. She wants to hold off.  - PT for lumbar spine. Orders to [facility] in [place].  - Prescription for mobic. Reviewed dosing and side effects. Take with food.  - Referral to ortho at [facility] for evaluation of bilateral wrist and left knee pain.  - If no improvement with above, may need to consider updated imaging (MRI and/or xrays).   - She will f/u in 8 weeks and prn.   I spent a total of 30 minutes in face-to-face and non-face-to-face activities related to this patient's care today including review of outside records, review of imaging, review of symptoms, physical exam, discussion of differential diagnosis, discussion of treatment options, and documentation.   Thank you for involving me in the care of this patient.   Geronimo Boot PA-C Dept. of Neurosurgery

## 2023-01-08 ENCOUNTER — Ambulatory Visit (INDEPENDENT_AMBULATORY_CARE_PROVIDER_SITE_OTHER): Payer: Medicare Other | Admitting: Orthopedic Surgery

## 2023-01-08 ENCOUNTER — Encounter: Payer: Self-pay | Admitting: Orthopedic Surgery

## 2023-01-08 VITALS — BP 140/70 | Ht 62.5 in | Wt 137.8 lb

## 2023-01-08 DIAGNOSIS — M47816 Spondylosis without myelopathy or radiculopathy, lumbar region: Secondary | ICD-10-CM

## 2023-01-08 DIAGNOSIS — M5416 Radiculopathy, lumbar region: Secondary | ICD-10-CM

## 2023-01-08 DIAGNOSIS — M25532 Pain in left wrist: Secondary | ICD-10-CM

## 2023-01-08 DIAGNOSIS — Z9889 Other specified postprocedural states: Secondary | ICD-10-CM | POA: Diagnosis not present

## 2023-01-08 DIAGNOSIS — M25562 Pain in left knee: Secondary | ICD-10-CM

## 2023-01-08 DIAGNOSIS — M5136 Other intervertebral disc degeneration, lumbar region: Secondary | ICD-10-CM | POA: Diagnosis not present

## 2023-01-08 DIAGNOSIS — M25531 Pain in right wrist: Secondary | ICD-10-CM | POA: Diagnosis not present

## 2023-01-08 DIAGNOSIS — M4726 Other spondylosis with radiculopathy, lumbar region: Secondary | ICD-10-CM

## 2023-01-08 MED ORDER — MELOXICAM 15 MG PO TABS: 15.0000 mg | ORAL_TABLET | Freq: Every day | ORAL | 2 refills | Status: AC

## 2023-01-08 NOTE — Patient Instructions (Signed)
It was so nice to see you today. Thank you so much for coming in.    Your old xrays from 2022 showed wear and tear in your back. I think your back and right leg pain is likely from your back.   Your left knee pain is likely from the knee.   I sent physical therapy orders to [facility]. You can call them at [phone] if you don't hear from them to schedule your visit.   I sent a prescription for meloxicam to help with pain and inflammation. Take as directed with food. Stop motrin.   I want you to see orthopaedics at the [facility] for your bilateral wrist pain and left knee pain. They should call you to schedule an appointment or you can call them at [phone].   I recommend seeing Dr. Rosana Berger at [facility].   I will see you back in 6-8 weeks. Please do not hesitate to call if you have any questions or concerns. You can also message me in [place].   Geronimo Boot PA-C [phone]

## 2023-01-15 ENCOUNTER — Other Ambulatory Visit
Admission: RE | Admit: 2023-01-15 | Discharge: 2023-01-15 | Disposition: A | Discharge status: Home / Self Care | Payer: Medicare Other | Attending: Urology | Admitting: Urology

## 2023-01-15 ENCOUNTER — Other Ambulatory Visit: Payer: Self-pay

## 2023-01-15 ENCOUNTER — Encounter: Payer: Self-pay | Admitting: Urology

## 2023-01-15 ENCOUNTER — Ambulatory Visit (INDEPENDENT_AMBULATORY_CARE_PROVIDER_SITE_OTHER): Payer: Medicare Other | Admitting: Urology

## 2023-01-15 VITALS — BP 144/74 | HR 77 | Ht 62.5 in | Wt 135.0 lb

## 2023-01-15 DIAGNOSIS — N390 Urinary tract infection, site not specified: Secondary | ICD-10-CM

## 2023-01-15 DIAGNOSIS — R350 Frequency of micturition: Secondary | ICD-10-CM

## 2023-01-15 DIAGNOSIS — N200 Calculus of kidney: Secondary | ICD-10-CM

## 2023-01-15 DIAGNOSIS — Z87440 Personal history of urinary (tract) infections: Secondary | ICD-10-CM

## 2023-01-15 DIAGNOSIS — Z87442 Personal history of urinary calculi: Secondary | ICD-10-CM | POA: Diagnosis not present

## 2023-01-15 LAB — URINALYSIS, COMPLETE (UACMP) WITH MICROSCOPIC
Bacteria, UA: NONE SEEN
Bilirubin Urine: NEGATIVE
Glucose, UA: NEGATIVE mg/dL
Ketones, ur: NEGATIVE mg/dL
Leukocytes,Ua: NEGATIVE
Nitrite: NEGATIVE
Protein, ur: NEGATIVE mg/dL
Specific Gravity, Urine: 1.01 (ref 1.005–1.030)
WBC, UA: NONE SEEN WBC/hpf (ref 0–5)
pH: 7 (ref 5.0–8.0)

## 2023-01-15 MED ORDER — ESTRADIOL 0.1 MG/GM VA CREA: TOPICAL_CREAM | VAGINAL | 12 refills | Status: DC

## 2023-01-15 NOTE — Patient Instructions (Signed)
For UTI prevention take cranberry tablets, probiotic, D-Mannose daily.  Use Estrogen cream 3 times a week.

## 2023-01-15 NOTE — Progress Notes (Signed)
Haze Rushing Plume,acting as a scribe for Hollice Espy, MD.,have documented all relevant documentation on the behalf of Hollice Espy, MD,as directed by  Hollice Espy, MD while in the presence of Hollice Espy, MD.  01/15/2023 3:56 PM   Kayla Gallegos 1951-02-27 [id number]  Referring provider: Pleas Koch, NP [place],  [place] [address]  Chief Complaint  Patient presents with   Hospitalization Follow-up    HPI: 72 year-old female who presents today for follow up of an ER visit on 12/20/2022.   Prior to go to the ER, she had been having issues with urgency and dysuria and was prescribed Macrobid. However, after taking 2 doses, she developed nausea and a headache so she went to the emergency room. She was given a dose of IV levaquin and discharged home with Augmentin.  She followed up 4 days later with her PCP, Alma Friendly at which time she reported that she felt that she may be passing a stone. She was prescribed Flomax, no additional imaging was performed and she was referred to urology.  She has several UTIs over the past calendar year including E. Coli in 03/2022 that was resistant to bactrim and levaquin, and enterococcus in 07/2022. Otherwise, no positive urine culture. Her most recent upper tract imaging was in 07/2022. At that time, she had no kidney stones.   She states that 8-10 days ago, she was experiencing pressure and discomfort. She saw a urologist in [place] and was diagnosed with kidney stones. Several days later, she was able to pass 2 small stones.   Her urinalysis today is unremarkable.  Today, she reports having urinary frequency most of her life. She reports nocturia 6+ times a night. She notes that she does snore but has not had a workup for sleep apnea. She denies any enuresis at this time. She denies any constipation and has regular, firm bowel movements.   Results for orders placed or performed during the hospital encounter of  01/15/23  Urinalysis, Complete w Microscopic -  Result Value Ref Range   Color, Urine YELLOW YELLOW   APPearance CLEAR CLEAR   Specific Gravity, Urine 1.010 1.005 - 1.030   pH 7.0 5.0 - 8.0   Glucose, UA NEGATIVE NEGATIVE mg/dL   Hgb urine dipstick TRACE (A) NEGATIVE   Bilirubin Urine NEGATIVE NEGATIVE   Ketones, ur NEGATIVE NEGATIVE mg/dL   Protein, ur NEGATIVE NEGATIVE mg/dL   Nitrite NEGATIVE NEGATIVE   Leukocytes,Ua NEGATIVE NEGATIVE   Squamous Epithelial / HPF 0-5 0 - 5 /HPF   WBC, UA NONE SEEN 0 - 5 WBC/hpf   RBC / HPF 0-5 0 - 5 RBC/hpf   Bacteria, UA NONE SEEN NONE SEEN    PMH: Past Medical History:  Diagnosis Date   Allergy    Anxiety    Cataract    bil cateracts removed   DIVERTICULOSIS, COLON 05/29/2008   Qualifier: Diagnosis of  By: Jenny Reichmann MD, Hunt Oris    GERD (gastroesophageal reflux disease)    Hyperlipidemia    Kidney stones    Laceration of skin of thigh, right, sequela 08/14/2020   PARESTHESIA 04/01/2009   Qualifier: Diagnosis of  By: Jenny Reichmann MD, Hunt Oris    Recurrent UTI (urinary tract infection)     Surgical History: Past Surgical History:  Procedure Laterality Date   [place] SURGERY  08/2018   [facility]   COLONOSCOPY  LAPAROSCOPIC CHOLECYSTECTOMY  1990   TONSILLECTOMY     UPPER GASTROINTESTINAL ENDOSCOPY      Home Medications:  Allergies as of 01/15/2023       Reactions   Macrobid [nitrofurantoin] Other (See Comments)   Elevated HR, Elevated BP, Headache, chills   Codeine Nausea And Vomiting, Nausea Only   dizziness Other Reaction(s): GI Intolerance dizziness, dizziness        Medication List        Accurate as of January 15, 2023  3:56 PM. If you have any questions, ask your nurse or doctor.          STOP taking these medications    desloratadine 5 MG tablet Commonly known as: Clarinex Stopped by: Hollice Espy, MD   ondansetron 4 MG disintegrating tablet Commonly known as:  ZOFRAN-ODT Stopped by: Hollice Espy, MD   tamsulosin 0.4 MG Caps capsule Commonly known as: FLOMAX Stopped by: Hollice Espy, MD       TAKE these medications    ascorbic acid 500 MG tablet Commonly known as: VITAMIN C Take by mouth daily.   estradiol 0.1 MG/GM vaginal cream Commonly known as: ESTRACE Estrogen Cream Instruction Discard applicator Apply pea sized amount to tip of finger to urethra before bed. Wash hands well after application. Use Monday, Wednesday and Friday Started by: Hollice Espy, MD   FOLIC ACID PO Take by mouth daily.   K2 Plus D3 [phone] MCG-UNIT Tabs Take by mouth.   MAGNESIUM CARBONATE PO Take by mouth.   MAGNESIUM PO Take by mouth.   meloxicam 15 MG tablet Commonly known as: MOBIC Take 1 tablet (15 mg total) by mouth daily. Take with food. Do not take motrin with this.   MENATETRENONE PO Take by mouth.   NAC 600 PO Take by mouth.   NIACIN PO Take by mouth.   Omega-3 500 MG Caps Take by mouth.   PROBIOTIC DAILY PO Take by mouth daily.   SELENIUM PO Take 1 tablet by mouth daily.   VITAMIN B 12 PO Take by mouth.   Vitamin B-Complex Tabs Take by mouth.   Zinc Sulfate 66 MG Tabs Take by mouth.        Allergies:  Allergies  Allergen Reactions   Macrobid [Nitrofurantoin] Other (See Comments)    Elevated HR, Elevated BP, Headache, chills   Codeine Nausea And Vomiting and Nausea Only    dizziness  Other Reaction(s): GI Intolerance  dizziness, dizziness    Family History: Family History  Problem Relation Age of Onset   Colon cancer Sister 39   Colon cancer Brother 22   Stomach cancer Neg Hx    Esophageal cancer Neg Hx    Rectal cancer Neg Hx     Social History:  reports that she has never smoked. She has never used smokeless tobacco. She reports that she does not drink alcohol and does not use drugs.   Physical Exam: BP (!) 144/74 (BP Location: Left Arm, Patient Position: Sitting, Cuff Size: Normal)    Pulse 77   Ht 5' 2.5" (1.588 m)   Wt 135 lb (61.2 kg)   BMI 24.30 kg/m   Constitutional:  Alert and oriented, No acute distress. HEENT: [place] AT, moist mucus membranes.  Trachea midline, no masses. Neurologic: Grossly intact, no focal deficits, moving all 4 extremities. Psychiatric: Normal mood and affect. Pertinent imaging: CLINICAL DATA:  Nausea vomiting.  Abdominal pain.   EXAM: CT ABDOMEN AND PELVIS WITHOUT CONTRAST   TECHNIQUE: Multidetector CT imaging  of the abdomen and pelvis was performed following the standard protocol without IV contrast.   RADIATION DOSE REDUCTION: This exam was performed according to the departmental dose-optimization program which includes automated exposure control, adjustment of the mA and/or kV according to patient size and/or use of iterative reconstruction technique.   COMPARISON:  CT dated 08/24/2016.   FINDINGS: Lower chest: 6 mm pleural base nodule, left lower lobe, image 13, series 4. 4 mm pleural based right middle lobe nodule, partly imaged, image 1, series 4. 4 mm pleural based nodule, right lower lobe, image 16, series 4. These areas were not included on the field of view from the prior abdomen and pelvis CT. No acute findings at the lung bases.   Hepatobiliary: No focal liver abnormality is seen. Status post cholecystectomy. No biliary dilatation.   Pancreas: Unremarkable. No pancreatic ductal dilatation or surrounding inflammatory changes.   Spleen: Normal in size without focal abnormality.   Adrenals/Urinary Tract: Adrenal glands are unremarkable. Kidneys are normal, without renal calculi, focal lesion, or hydronephrosis. Bladder is unremarkable.   Stomach/Bowel: Normal stomach. Small bowel and colon are normal in caliber. No wall thickening. No inflammation. Multiple left colon diverticula.   Vascular/Lymphatic: Aortic atherosclerosis. No aneurysm. No enlarged lymph nodes.   Reproductive: Status post hysterectomy. No  adnexal masses.   Other: Minimal fat containing umbilical hernia.  No ascites.   Musculoskeletal: No fracture or acute finding.  No bone lesion.   IMPRESSION: 1. No acute findings within the abdomen or pelvis. 2. Small lung base nodules. Non-contrast chest CT at 3-6 months is recommended. If the nodules are stable at time of repeat CT, then future CT at 18-24 months (from today's scan) is considered optional for low-risk patients, but is recommended for high-risk patients. This recommendation follows the consensus statement: Guidelines for Management of Incidental Pulmonary Nodules Detected on CT Images: From the Fleischner Society 2017; Radiology 2017; 284:228-243. 3. Aortic atherosclerosis.     Electronically Signed   By: Lajean Manes M.D.   On: 08/01/2022 12:14  This was personally reviewed and I agree with the radiologic interpretation.  Assessment & Plan:    1. History of kidney stones - It does not appear that she is having a kidney event at this time.  - We will get records from Dr. Zettie Pho office. A release was signed.  - We discussed general stone prevention techniques including drinking plenty water with goal of producing 2.5 L urine daily, increased citric acid intake, avoidance of high oxalate containing foods, and decreased salt intake.  Information about dietary recommendations given today.   2. Recurrent UTIs - She has had 3 UTIs this year. It is unclear if the most recent one was as it was performed at [facility] where her UA was performed.  - Recommended cranberry tablets, probiotics, and D-mannose daily for prevention.  - Continue using estrogen cream. - If UTI symptoms develop, we discussed that she is able to contact us and make an appointment.   3. Urinary frequency - Investigate other causes like sleep apnea, cut back fluid intake before nighttime, especially for nocturia symptoms.  - Consider a medication for urgency frequency symptoms if she would like. We  discussed how it would not fix the problem but would help with the symptoms.  - We are going to hold off on OAB medication and she will work with her primary about a possible sleep apnea test.   Return in about 1 year (around 01/15/2024) for follow up .  I have reviewed the above documentation for accuracy and completeness, and I agree with the above.   Hollice Espy, MD   [facility] [address] [place], [place] [address] [phone] '

## 2023-02-26 NOTE — Progress Notes (Deleted)
Referring Physician:  Doreene Nest, NP [address] [place],  [place] [address]  Primary Physician:  Doreene Nest, NP  History of Present Illness: 02/26/2023 [name] has a history of GERD, IBS, hyperlipidemia.   She has history of MIS left L4-L5 laminectomy/foraminotomy in 2019 at [facility]. She improved after surgery, but looks like she started having increased back and left leg pain about a year later. She started HEP and this improved.   Last seen by me on 01/08/23 for back and right leg pain. 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 Her LBP and right leg pain are likely lumbar mediated. Left knee pain appeared knee mediated.   I sent her to PT at [facility] and referred her to ortho for her left knee pain and bilateral wrist pain- did not see PT or ortho?***. She was started on mobic.   She is here for follow up.        She has 6 week history of intermittent LBP with intermittent right lateral leg pain to her foot. Pain in right leg wakes her up at night and feels like cramping- this improves with walking.  She also has left knee pain that is worse with standing and walking. Occasional numbness/tingling in left leg. Minimal weakness in legs. She is limping due to left knee pain.   Some relief with motrin.   Bowel/Bladder Dysfunction: none  Conservative measures:  Physical therapy: no recent  Multimodal medical therapy including regular antiinflammatories: naproxen , Ibuprofen over the counter Injections: no recent epidural steroid injections  Past Surgery:  MIS left L4-L5 laminectomy/foraminotomy in 2019 at [facility] has no symptoms of cervical myelopathy.  The symptoms are causing a significant impact on the patient's life.   Review of Systems:  A 10 point review of systems is negative, except for the pertinent positives and negatives detailed in the HPI.  Past Medical History: Past Medical History:  Diagnosis Date    Allergy    Anxiety    Cataract    bil cateracts removed   DIVERTICULOSIS, COLON 05/29/2008   Qualifier: Diagnosis of  By: Jonny Ruiz MD, Len Blalock    GERD (gastroesophageal reflux disease)    Hyperlipidemia    Kidney stones    Laceration of skin of thigh, right, sequela 08/14/2020   PARESTHESIA 04/01/2009   Qualifier: Diagnosis of  By: Jonny Ruiz MD, Len Blalock    Recurrent UTI (urinary tract infection)     Past Surgical History: Past Surgical History:  Procedure Laterality Date   ABDOMINAL HYSTERECTOMY  1976   APPENDECTOMY     BACK SURGERY  08/2018   [facility]   COLONOSCOPY     LAPAROSCOPIC CHOLECYSTECTOMY  1990   TONSILLECTOMY     UPPER GASTROINTESTINAL ENDOSCOPY      Allergies: Allergies as of 03/01/2023 - Review Complete 01/15/2023  Allergen Reaction Noted   Macrobid [nitrofurantoin] Other (See Comments) 12/22/2022   Codeine Nausea And Vomiting and Nausea Only 11/17/2012    Medications: Outpatient Encounter Medications as of 03/01/2023  Medication Sig   Acetylcysteine (NAC 600 PO) Take by mouth.   ascorbic acid (VITAMIN C) 500 MG tablet Take by mouth daily.   B Complex Vitamins (VITAMIN B-COMPLEX) TABS Take by mouth.   Cyanocobalamin (VITAMIN B 12 PO) Take by mouth.   estradiol (ESTRACE) 0.1 MG/GM vaginal cream Estrogen Cream Instruction Discard applicator Apply pea sized amount to tip of finger to urethra before bed. Wash  hands well after application. Use Monday, Wednesday and Friday   FOLIC ACID PO Take by mouth daily.   Krill Oil (OMEGA-3) 500 MG CAPS Take by mouth.   Magnesium Carbonate, Antacid, (MAGNESIUM CARBONATE PO) Take by mouth.   MAGNESIUM PO Take by mouth.   meloxicam (MOBIC) 15 MG tablet Take 1 tablet (15 mg total) by mouth daily. Take with food. Do not take motrin with this.   MENATETRENONE PO Take by mouth.   NIACIN PO Take by mouth.   Probiotic Product (PROBIOTIC DAILY PO) Take by mouth daily.   SELENIUM PO Take 1 tablet by mouth daily.   Vitamin D-Vitamin K (K2  PLUS D3) [phone] MCG-UNIT TABS Take by mouth.   Zinc Sulfate 66 MG TABS Take by mouth.   No facility-administered encounter medications on file as of 03/01/2023.    Social History: Social History   Tobacco Use   Smoking status: Never   Smokeless tobacco: Never  Vaping Use   Vaping Use: Never used  Substance Use Topics   Alcohol use: No   Drug use: No    Family Medical History: Family History  Problem Relation Age of Onset   Colon cancer Sister 59   Colon cancer Brother 40   Stomach cancer Neg Hx    Esophageal cancer Neg Hx    Rectal cancer Neg Hx     Physical Examination: There were no vitals filed for this visit.  Awake, alert, oriented to person, place, and time.  Speech is clear and fluent. Fund of knowledge is appropriate.   Cranial Nerves: Pupils equal round and reactive to light.  Facial tone is symmetric.    Limited ROM of lumbar spine without pain No posterior lumbar tenderness. Well healed lumbar incision.   No abnormal lesions on exposed skin.   Strength: Side Biceps Triceps Deltoid Interossei Grip Wrist Ext. Wrist Flex.  R L Side Iliopsoas Quads Hamstring PF DF EHL  R L Reflexes are 2+ and symmetric at the biceps, triceps, brachioradialis, patella and achilles.   Hoffman's is absent.  Clonus is not present.   Bilateral upper and lower extremity sensation is intact to light touch.     She has medial and lateral joint line tenderness left knee with painful ROM.   Gait is normal.     Medical Decision Making  Imaging: none  Assessment and Plan: Kayla Gallegos is a pleasant 72 y.o. female has a history of MIS left L4-L5 laminectomy/foraminotomy in 2019 at [facility]. She improved after surgery, but looks like she started having increased back and left leg pain about a year later. She started HEP and this improved.   Now with a 6 week history of intermittent LBP with intermittent right lateral leg  pain to her foot. Pain in right leg wakes her up at night and feels like cramping- this improves with walking.  She also has left knee pain that is worse with standing and walking.   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 Her LBP and right leg pain are likely lumbar mediated. Left knee pain appears knee mediated.   Treatment options discussed with patient and following plan made:   - Recommended updated lumbar xrays. She wants to hold off.  - PT for lumbar spine. Orders to [facility] in [place].  -  Prescription for mobic. Reviewed dosing and side effects. Take with food.  - Referral to ortho at [facility] for evaluation of bilateral wrist and left knee pain.  - If no improvement with above, may need to consider updated imaging (MRI and/or xrays).   - She will f/u in 8 weeks and prn.   I spent a total of 30 minutes in face-to-face and non-face-to-face activities related to this patient's care today including review of outside records, review of imaging, review of symptoms, physical exam, discussion of differential diagnosis, discussion of treatment options, and documentation.   Thank you for involving me in the care of this patient.   Drake Leach PA-C Dept. of Neurosurgery

## 2023-03-01 ENCOUNTER — Ambulatory Visit: Payer: Medicare Other | Admitting: Orthopedic Surgery

## 2023-03-17 ENCOUNTER — Encounter: Payer: Self-pay | Admitting: Gastroenterology

## 2023-03-30 ENCOUNTER — Encounter: Payer: Self-pay | Admitting: Gastroenterology

## 2023-04-20 DIAGNOSIS — M25562 Pain in left knee: Secondary | ICD-10-CM | POA: Insufficient documentation

## 2023-05-06 DIAGNOSIS — R262 Difficulty in walking, not elsewhere classified: Secondary | ICD-10-CM | POA: Diagnosis not present

## 2023-05-06 DIAGNOSIS — S39012D Strain of muscle, fascia and tendon of lower back, subsequent encounter: Secondary | ICD-10-CM | POA: Diagnosis not present

## 2023-05-06 DIAGNOSIS — M25562 Pain in left knee: Secondary | ICD-10-CM | POA: Diagnosis not present

## 2023-05-07 NOTE — Progress Notes (Unsigned)
Referring Physician:  No referring provider defined for this encounter.  Primary Physician:  Doreene Nest, NP  History of Present Illness: 05/10/2023 Kayla Gallegos has a history of GERD, IBS, hyperlipidemia.   She has history of MIS left L4-L5 laminectomy/foraminotomy in 2019 at [facility]. She improved after surgery, but looks like she started having increased back and right leg pain about a year later. She started HEP and this improved.   Last seen by me on 01/08/23 for intermittent LBP and right leg pain. 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 Her LBP and right leg pain are likely lumbar mediated. Left knee pain appears knee mediated.   Repeat lumbar xrays were recommended and she declined. She was sent to PT, given mobic, and sent to ortho at [facility] for wrist and knee pain.   She was lost to follow up.   She did PT for her back at [facility]- did initial evaluation on Thursday and she had significant pain after. She has constant pain in her LBP with right lateral leg pain to her knee. She has left knee pain, but no radicular pain on left. Pain is worse with standing and walking.   Was told she had cyst and bone spur on right side in the past.   She saw [facility] for her left knee and was told she had arthritis and a small tear.   Some relief with motrin.   Bowel/Bladder Dysfunction: none  Conservative measures:  Physical therapy: did initial eval at [facility] last week.  Multimodal medical therapy including regular antiinflammatories: naproxen , Ibuprofen over the counter Injections: no recent epidural steroid injections  Past Surgery:  MIS left L4-L5 laminectomy/foraminotomy in 2019 at [facility] has no symptoms of cervical myelopathy.  The symptoms are causing a significant impact on the patient's life.   Review of Systems:  A 10 point review of systems is negative, except for the pertinent positives and negatives detailed in the HPI.  Past Medical  History: Past Medical History:  Diagnosis Date   Allergy    Anxiety    Cataract    bil cateracts removed   DIVERTICULOSIS, COLON 05/29/2008   Qualifier: Diagnosis of  By: Jonny Ruiz MD, Len Blalock    GERD (gastroesophageal reflux disease)    Hyperlipidemia    Kidney stones    Laceration of skin of thigh, right, sequela 08/14/2020   PARESTHESIA 04/01/2009   Qualifier: Diagnosis of  By: Jonny Ruiz MD, Len Blalock    Recurrent UTI (urinary tract infection)     Past Surgical History: Past Surgical History:  Procedure Laterality Date   ABDOMINAL HYSTERECTOMY  1976   APPENDECTOMY     BACK SURGERY  08/2018   [place]   COLONOSCOPY     LAPAROSCOPIC CHOLECYSTECTOMY  1990   TONSILLECTOMY     UPPER GASTROINTESTINAL ENDOSCOPY      Allergies: Allergies as of 05/10/2023 - Review Complete 05/10/2023  Allergen Reaction Noted   Macrobid [nitrofurantoin] Other (See Comments) 12/22/2022   Codeine Nausea And Vomiting and Nausea Only 11/17/2012    Medications: Outpatient Encounter Medications as of 05/10/2023  Medication Sig   Acetylcysteine (NAC 600 PO) Take by mouth.   ascorbic acid (VITAMIN C) 500 MG tablet Take by mouth daily.   B Complex Vitamins (VITAMIN B-COMPLEX) TABS Take by mouth.   Cyanocobalamin (VITAMIN B 12 PO) Take by mouth.   estradiol (ESTRACE) 0.1 MG/GM vaginal cream Estrogen Cream Instruction Discard applicator Apply pea  sized amount to tip of finger to urethra before bed. Wash hands well after application. Use Monday, Wednesday and Friday   FOLIC ACID PO Take by mouth daily.   Krill Oil (OMEGA-3) 500 MG CAPS Take by mouth.   Magnesium Carbonate, Antacid, (MAGNESIUM CARBONATE PO) Take by mouth.   MAGNESIUM PO Take by mouth.   MENATETRENONE PO Take by mouth.   NIACIN PO Take by mouth.   Probiotic Product (PROBIOTIC DAILY PO) Take by mouth daily.   SELENIUM PO Take 1 tablet by mouth daily.   Vitamin D-Vitamin K (K2 PLUS D3) [phone] MCG-UNIT TABS Take by mouth.   Zinc Sulfate 66 MG TABS  Take by mouth.   No facility-administered encounter medications on file as of 05/10/2023.    Social History: Social History   Tobacco Use   Smoking status: Never   Smokeless tobacco: Never  Vaping Use   Vaping Use: Never used  Substance Use Topics   Alcohol use: No   Drug use: No    Family Medical History: Family History  Problem Relation Age of Onset   Colon cancer Sister 65   Colon cancer Brother 40   Stomach cancer Neg Hx    Esophageal cancer Neg Hx    Rectal cancer Neg Hx     Physical Examination: Vitals:   05/10/23 1045  BP: 138/72      Awake, alert, oriented to person, place, and time.  Speech is clear and fluent. Fund of knowledge is appropriate.   Cranial Nerves: Pupils equal round and reactive to light.  Facial tone is symmetric.    Limited ROM of lumbar spine with pain.  Well healed lumbar incision. Mild tenderness at right SI joint.   No abnormal lesions on exposed skin.   Strength:  Side Iliopsoas Quads Hamstring PF DF EHL  R 5 5 5 5 5 5   L 5 5 5 5 5 5    Reflexes are 2+ and symmetric at the patella and achilles.   Clonus is not present.   Bilateral lower extremity sensation is intact to light touch.     Gait is normal.     Medical Decision Making  Imaging: No updated imaging.   Assessment and Plan: Kayla Gallegos is a pleasant 72 y.o. female has a history of MIS left L4-L5 laminectomy/foraminotomy in 2019 at [facility]. She improved after surgery, but looks like she started having increased back and left leg pain about a year later. She started HEP and this improved.   Saw her 4 months ago with similar complaints. She has constant pain in her LBP with right lateral leg pain to her knee. She has left knee pain, but no radicular pain on left. Pain is worse with standing and walking.   Had PT on Thursday for her back and this made her pain worse.   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   Treatment options discussed with patient  and following plan made:   - MRI of lumbar spine to further evaluate right radicular pain.  - Would discuss pain with PT and would not repeat treatment that was done last week.  - Will call and set up phone visit to review lumbar MRI once I get the results back.   I spent a total of 20 minutes in face-to-face and non-face-to-face activities related to this patient's care today including review of outside records, review of imaging, review of symptoms, physical exam, discussion of differential diagnosis, discussion of treatment options,  and documentation.   Drake Leach PA-C Dept. of Neurosurgery

## 2023-05-10 ENCOUNTER — Ambulatory Visit (INDEPENDENT_AMBULATORY_CARE_PROVIDER_SITE_OTHER): Payer: Medicare Other | Admitting: Orthopedic Surgery

## 2023-05-10 ENCOUNTER — Encounter: Payer: Self-pay | Admitting: Orthopedic Surgery

## 2023-05-10 VITALS — BP 138/72 | Ht 62.5 in | Wt 135.0 lb

## 2023-05-10 DIAGNOSIS — M5136 Other intervertebral disc degeneration, lumbar region: Secondary | ICD-10-CM

## 2023-05-10 DIAGNOSIS — M5416 Radiculopathy, lumbar region: Secondary | ICD-10-CM

## 2023-05-10 DIAGNOSIS — M47816 Spondylosis without myelopathy or radiculopathy, lumbar region: Secondary | ICD-10-CM

## 2023-05-10 DIAGNOSIS — Z9889 Other specified postprocedural states: Secondary | ICD-10-CM

## 2023-05-10 DIAGNOSIS — M4726 Other spondylosis with radiculopathy, lumbar region: Secondary | ICD-10-CM

## 2023-05-10 NOTE — Patient Instructions (Signed)
It was so nice to see you today. Thank you so much for coming in.    Your xrays from December shows some wear and tear (arthritis).   I want to get an MRI of your lower back to look into things further. We will get this approved through your insurance and [facility] will call you to schedule the appointment.   I would talk to your physical therapist and not repeat what she did last week that made your pain worse.   It will take 5-7 days for me to get the MRI results back once you have them done. We will call you to schedule a phone visit with me to review them.   Please do not hesitate to call if you have any questions or concerns. You can also message me in MyChart.   Drake Leach PA-C [phone]

## 2023-05-11 ENCOUNTER — Ambulatory Visit: Payer: Medicare Other | Admitting: Student in an Organized Health Care Education/Training Program

## 2023-05-12 DIAGNOSIS — R262 Difficulty in walking, not elsewhere classified: Secondary | ICD-10-CM | POA: Diagnosis not present

## 2023-05-12 DIAGNOSIS — S39012D Strain of muscle, fascia and tendon of lower back, subsequent encounter: Secondary | ICD-10-CM | POA: Diagnosis not present

## 2023-05-12 DIAGNOSIS — M25562 Pain in left knee: Secondary | ICD-10-CM | POA: Diagnosis not present

## 2023-05-14 DIAGNOSIS — R262 Difficulty in walking, not elsewhere classified: Secondary | ICD-10-CM | POA: Diagnosis not present

## 2023-05-14 DIAGNOSIS — S39012D Strain of muscle, fascia and tendon of lower back, subsequent encounter: Secondary | ICD-10-CM | POA: Diagnosis not present

## 2023-05-14 DIAGNOSIS — M25562 Pain in left knee: Secondary | ICD-10-CM | POA: Diagnosis not present

## 2023-05-18 DIAGNOSIS — S39012D Strain of muscle, fascia and tendon of lower back, subsequent encounter: Secondary | ICD-10-CM | POA: Diagnosis not present

## 2023-05-18 DIAGNOSIS — M25562 Pain in left knee: Secondary | ICD-10-CM | POA: Diagnosis not present

## 2023-05-18 DIAGNOSIS — R262 Difficulty in walking, not elsewhere classified: Secondary | ICD-10-CM | POA: Diagnosis not present

## 2023-05-21 DIAGNOSIS — M25562 Pain in left knee: Secondary | ICD-10-CM | POA: Diagnosis not present

## 2023-05-21 DIAGNOSIS — S39012D Strain of muscle, fascia and tendon of lower back, subsequent encounter: Secondary | ICD-10-CM | POA: Diagnosis not present

## 2023-05-21 DIAGNOSIS — R262 Difficulty in walking, not elsewhere classified: Secondary | ICD-10-CM | POA: Diagnosis not present

## 2023-05-24 DIAGNOSIS — S39012D Strain of muscle, fascia and tendon of lower back, subsequent encounter: Secondary | ICD-10-CM | POA: Diagnosis not present

## 2023-05-24 DIAGNOSIS — M25562 Pain in left knee: Secondary | ICD-10-CM | POA: Diagnosis not present

## 2023-05-24 DIAGNOSIS — R262 Difficulty in walking, not elsewhere classified: Secondary | ICD-10-CM | POA: Diagnosis not present

## 2023-05-25 ENCOUNTER — Ambulatory Visit (INDEPENDENT_AMBULATORY_CARE_PROVIDER_SITE_OTHER): Payer: Medicare Other

## 2023-05-25 VITALS — Ht 62.5 in | Wt 130.0 lb

## 2023-05-25 DIAGNOSIS — Z0000 Encounter for general adult medical examination without abnormal findings: Secondary | ICD-10-CM

## 2023-05-25 NOTE — Progress Notes (Signed)
Subjective:   Kayla Gallegos is a 72 y.o. female who presents for Medicare Annual (Subsequent) preventive examination.  Visit Complete: Virtual  I connected with  Yu J Frankowski on 05/25/23 by a audio enabled telemedicine application and verified that I am speaking with the correct person using two identifiers.  Patient Location: Home  Provider Location: Office/Clinic  I discussed the limitations of evaluation and management by telemedicine. The patient expressed understanding and agreed to proceed.  Review of Systems      Cardiac Risk Factors include: advanced age (>47men, >44 women);dyslipidemia     Objective:    Today's Vitals   05/25/23 1256  Weight: 130 lb (59 kg)  Height: 5' 2.5" (1.588 m)   Body mass index is 23.4 kg/m.     05/25/2023    1:11 PM 12/20/2022   10:42 PM 08/01/2022   10:01 AM 04/22/2022    2:14 PM 04/21/2021    2:47 PM 03/11/2020    9:05 AM 07/27/2018    9:11 PM  Advanced Directives  Does Patient Have a Medical Advance Directive? No No No No No No No  Would patient like information on creating a medical advance directive? No - Patient declined   No - Patient declined No - Patient declined No - Patient declined     Current Medications (verified) Outpatient Encounter Medications as of 05/25/2023  Medication Sig   Acetylcysteine (NAC 600 PO) Take by mouth.   ascorbic acid (VITAMIN C) 500 MG tablet Take by mouth daily.   B Complex Vitamins (VITAMIN B-COMPLEX) TABS Take by mouth.   Cyanocobalamin (VITAMIN B 12 PO) Take by mouth.   estradiol (ESTRACE) 0.1 MG/GM vaginal cream Estrogen Cream Instruction Discard applicator Apply pea sized amount to tip of finger to urethra before bed. Wash hands well after application. Use Monday, Wednesday and Friday   FOLIC ACID PO Take by mouth daily.   Krill Oil (OMEGA-3) 500 MG CAPS Take by mouth.   MAGNESIUM GLYCINATE PO Take by mouth.   MAGNESIUM PO Take by mouth.   Multiple Vitamin (MULTIVITAMIN ADULT PO) Take by  mouth. Bone Renewal Takes Twice weekly   NIACIN PO Take by mouth.   Policos-Garlic-Plant Sterols (PLANT STEROL CHOLESTEROL CONT PO) Take by mouth.   Probiotic Product (PROBIOTIC DAILY PO) Take by mouth daily.   SELENIUM PO Take 1 tablet by mouth daily.   Vitamin D-Vitamin K (K2 PLUS D3) [phone] MCG-UNIT TABS Take by mouth.   Zinc Sulfate 66 MG TABS Take by mouth.   Magnesium Carbonate, Antacid, (MAGNESIUM CARBONATE PO) Take by mouth. (Patient not taking: Reported on 05/25/2023)   MENATETRENONE PO Take by mouth. (Patient not taking: Reported on 05/25/2023)   No facility-administered encounter medications on file as of 05/25/2023.    Allergies (verified) Macrobid [nitrofurantoin] and Codeine   History: Past Medical History:  Diagnosis Date   Allergy    Anxiety    Cataract    bil cateracts removed   DIVERTICULOSIS, COLON 05/29/2008   Qualifier: Diagnosis of  By: Jonny Ruiz MD, Len Blalock    GERD (gastroesophageal reflux disease)    Hyperlipidemia    Kidney stones    Laceration of skin of thigh, right, sequela 08/14/2020   PARESTHESIA 04/01/2009   Qualifier: Diagnosis of  By: Jonny Ruiz MD, Len Blalock    Recurrent UTI (urinary tract infection)    Past Surgical History:  Procedure Laterality Date   ABDOMINAL HYSTERECTOMY  1976   APPENDECTOMY     BACK SURGERY  08/2018   [place]   COLONOSCOPY     LAPAROSCOPIC CHOLECYSTECTOMY  1990   TONSILLECTOMY     UPPER GASTROINTESTINAL ENDOSCOPY     Family History  Problem Relation Age of Onset   Colon cancer Sister 32   Colon cancer Brother 35   Stomach cancer Neg Hx    Esophageal cancer Neg Hx    Rectal cancer Neg Hx    Social History   Socioeconomic History   Marital status: Married    Spouse name: Not on file   Number of children: Not on file   Years of education: Not on file   Highest education level: Not on file  Occupational History   Not on file  Tobacco Use   Smoking status: Never   Smokeless tobacco: Never  Vaping Use   Vaping Use:  Never used  Substance and Sexual Activity   Alcohol use: No   Drug use: No   Sexual activity: Yes    Birth control/protection: Surgical  Other Topics Concern   Not on file  Social History Narrative   Not on file   Social Determinants of Health   Financial Resource Strain: Low Risk  (05/25/2023)   Overall Financial Resource Strain (CARDIA)    Difficulty of Paying Living Expenses: Not hard at all  Food Insecurity: No Food Insecurity (05/25/2023)   Hunger Vital Sign    Worried About Running Out of Food in the Last Year: Never true    Ran Out of Food in the Last Year: Never true  Transportation Needs: No Transportation Needs (05/25/2023)   PRAPARE - Administrator, Civil Service (Medical): No    Lack of Transportation (Non-Medical): No  Physical Activity: Sufficiently Active (05/25/2023)   Exercise Vital Sign    Days of Exercise per Week: 7 days    Minutes of Exercise per Session: 120 min  Stress: No Stress Concern Present (05/25/2023)   [employer] of Occupational Health - Occupational Stress Questionnaire    Feeling of Stress : Not at all  Social Connections: Moderately Isolated (05/25/2023)   Social Connection and Isolation Panel [NHANES]    Frequency of Communication with Friends and Family: More than three times a week    Frequency of Social Gatherings with Friends and Family: More than three times a week    Attends Religious Services: Never    Database administrator or Organizations: No    Attends Engineer, structural: Never    Marital Status: Married    Tobacco Counseling Counseling given: Not Answered   Clinical Intake:  Pre-visit preparation completed: Yes  Pain : No/denies pain     BMI - recorded: 23.4 Nutritional Status: BMI of 19-24  Normal Nutritional Risks: None Diabetes: No  How often do you need to have someone help you when you read instructions, pamphlets, or other written materials from your doctor or pharmacy?: 1 -  Never  Interpreter Needed?: No  Information entered by :: C.Linlee Cromie LPN   Activities of Daily Living    05/25/2023    1:12 PM  In your present state of health, do you have any difficulty performing the following activities:  Hearing? 0  Vision? 0  Difficulty concentrating or making decisions? 1  Comment occasionally forgets  Walking or climbing stairs? 0  Dressing or bathing? 0  Doing errands, shopping? 0  Preparing Food and eating ? N  Using the Toilet? N  In the past six months, have you accidently leaked  urine? N  Do you have problems with loss of bowel control? N  Managing your Medications? N  Managing your Finances? N  Housekeeping or managing your Housekeeping? N    Patient Care Team: Doreene Nest, NP as PCP - General (Internal Medicine)  Indicate any recent Medical Services you may have received from other than [facility] providers in the past year (date may be approximate).     Assessment:   This is a routine wellness examination for Kayla Gallegos.  Hearing/Vision screen Hearing Screening - Comments:: No hearing issues Vision Screening - Comments:: No vision issues - [facility] - UTD on eye exams  Dietary issues and exercise activities discussed:     Goals Addressed             This Visit's Progress    Patient Stated       Stay Active       Depression Screen    05/25/2023    1:10 PM 12/24/2022   11:13 AM 07/02/2022    9:23 AM 04/22/2022    2:10 PM 04/21/2021    2:48 PM 08/14/2020    8:15 AM 04/05/2020    3:13 PM  PHQ 2/9 Scores  PHQ - 2 Score 0 2 0 1 2 3 4   PHQ- 9 Score  5 0  3 3 12     Fall Risk    05/25/2023    1:12 PM 12/24/2022   11:13 AM 04/22/2022    2:15 PM 04/21/2021    2:47 PM 03/11/2020    9:06 AM  Fall Risk   Falls in the past year? 0 0 0 0 0  Number falls in past yr: 0 0 0 0 0  Injury with Fall? 0 0 0 0 0  Risk for fall due to : No Fall Risks No Fall Risks No Fall Risks Medication side effect No Fall Risks  Follow up Falls prevention  discussed;Falls evaluation completed Falls evaluation completed Falls prevention discussed Falls evaluation completed;Falls prevention discussed Falls evaluation completed;Falls prevention discussed    MEDICARE RISK AT HOME:  Medicare Risk at Home - 05/25/23 1313     Any stairs in or around the home? Yes    If so, are there any without handrails? No    Home free of loose throw rugs in walkways, pet beds, electrical cords, etc? Yes    Adequate lighting in your home to reduce risk of falls? Yes    Life alert? No    Use of a cane, walker or w/c? No    Grab bars in the bathroom? No    Shower chair or bench in shower? Yes    Elevated toilet seat or a handicapped toilet? Yes             TIMED UP AND GO:  Was the test performed?  No    Cognitive Function:    04/21/2021    2:52 PM 03/11/2020    9:09 AM  MMSE - Mini Mental State Exam  Not completed: Refused   Orientation to time  5  Orientation to Place  5  Registration  3  Attention/ Calculation  5  Recall  3  Language- repeat  1        05/25/2023    1:14 PM 04/22/2022    2:17 PM  6CIT Screen  What Year? 0 points 0 points  What month? 0 points 0 points  What time? 0 points 0 points  Count back from 20 0 points 0  points  Months in reverse 0 points 0 points  Repeat phrase 0 points 0 points  Total Score 0 points 0 points    Immunizations Immunization History  Administered Date(s) Administered   Tdap 05/22/2020    TDAP status: Up to date  Flu Vaccine status: Declined, Education has been provided regarding the importance of this vaccine but patient still declined. Advised may receive this vaccine at local pharmacy or Health Dept. Aware to provide a copy of the vaccination record if obtained from local pharmacy or Health Dept. Verbalized acceptance and understanding.  Pneumococcal vaccine status: Declined,  Education has been provided regarding the importance of this vaccine but patient still declined. Advised may receive  this vaccine at local pharmacy or Health Dept. Aware to provide a copy of the vaccination record if obtained from local pharmacy or Health Dept. Verbalized acceptance and understanding.   Covid-19 vaccine status: Declined, Education has been provided regarding the importance of this vaccine but patient still declined. Advised may receive this vaccine at local pharmacy or Health Dept.or vaccine clinic. Aware to provide a copy of the vaccination record if obtained from local pharmacy or Health Dept. Verbalized acceptance and understanding.  Qualifies for Shingles Vaccine? Yes   Zostavax completed No   Shingrix Completed?: No.    Education has been provided regarding the importance of this vaccine. Patient has been advised to call insurance company to determine out of pocket expense if they have not yet received this vaccine. Advised may also receive vaccine at local pharmacy or Health Dept. Verbalized acceptance and understanding.  Screening Tests Health Maintenance  Topic Date Due   Zoster Vaccines- Shingrix (1 of 2) Never done   Colonoscopy  03/15/2023   MAMMOGRAM  07/03/2023 (Originally 05/13/2022)   Pneumonia Vaccine 32+ Years old (1 of 1 - PCV) 12/25/2023 (Originally 07/09/2015)   INFLUENZA VACCINE  06/17/2023   Medicare Annual Wellness (AWV)  05/24/2024   DTaP/Tdap/Td (2 - Td or Tdap) 05/22/2030   DEXA SCAN  Completed   Hepatitis C Screening  Completed   HPV VACCINES  Aged Out   COVID-19 Vaccine  Discontinued    Health Maintenance  Health Maintenance Due  Topic Date Due   Zoster Vaccines- Shingrix (1 of 2) Never done   Colonoscopy  03/15/2023    Colorectal cancer screening: Type of screening: Colonoscopy. Completed 03/14/18. Repeat every 5 years Pt states colonoscopy is scheduled for 07/03/23.  Mammogram - Declined  Bone Scan - Declined  Lung Cancer Screening: (Low Dose CT Chest recommended if Age 73-80 years, 20 pack-year currently smoking OR have quit w/in 15years.) does not  qualify.   Lung Cancer Screening Referral: n/a  Additional Screening:  Hepatitis C Screening: does qualify; Completed 09/30/16  Vision Screening: Recommended annual ophthalmology exams for early detection of glaucoma and other disorders of the eye. Is the patient up to date with their annual eye exam?  Yes  Who is the provider or what is the name of the office in which the patient attends annual eye exams? [facility] If pt is not established with a provider, would they like to be referred to a provider to establish care? Yes .   Dental Screening: Recommended annual dental exams for proper oral hygiene    Community Resource Referral / Chronic Care Management: CRR required this visit?  No   CCM required this visit?  No     Plan:     I have personally reviewed and noted the following in the patient's  chart:   Medical and social history Use of alcohol, tobacco or illicit drugs  Current medications and supplements including opioid prescriptions. Patient is not currently taking opioid prescriptions. Functional ability and status Nutritional status Physical activity Advanced directives List of other physicians Hospitalizations, surgeries, and ER visits in previous 12 months Vitals Screenings to include cognitive, depression, and falls Referrals and appointments  In addition, I have reviewed and discussed with patient certain preventive protocols, quality metrics, and best practice recommendations. A written personalized care plan for preventive services as well as general preventive health recommendations were provided to patient.     Maryan Puls, LPN   11/21/1094   After Visit Summary: (MyChart) Due to this being a telephonic visit, the after visit summary with patients personalized plan was offered to patient via MyChart   Nurse note : Pt declined mammogram and bone scan.  Vaccinations: declines all Influenza vaccine: recommend every Fall Pneumococcal  vaccine: recommend once per lifetime Prevnar-20 Tdap vaccine: recommend every 10 years Shingles vaccine: recommend Shingrix which is 2 doses 2-6 months apart and over 90% effective     Covid-19: recommend 2 doses one month apart with a booster 6 months later

## 2023-05-25 NOTE — Patient Instructions (Addendum)
Kayla Gallegos , Thank you for taking time to come for your Medicare Wellness Visit. I appreciate your ongoing commitment to your health goals. Please review the following plan we discussed and let me know if I can assist you in the future.   These are the goals we discussed:  Goals      Patient Stated     03/11/2020, I will continue to work in my garden everyday.     Patient Stated     04/21/2021, I will continue to walk 40,000 steps daily.      Patient Stated     Stay Active        This is a list of the screening recommended for you and due dates:  Health Maintenance  Topic Date Due   Zoster (Shingles) Vaccine (1 of 2) Never done   Colon Cancer Screening  03/15/2023   Medicare Annual Wellness Visit  04/23/2023   Mammogram  07/03/2023*   Pneumonia Vaccine (1 of 1 - PCV) 12/25/2023*   Flu Shot  06/17/2023   DTaP/Tdap/Td vaccine (2 - Td or Tdap) 05/22/2030   DEXA scan (bone density measurement)  Completed   Hepatitis C Screening  Completed   HPV Vaccine  Aged Out   COVID-19 Vaccine  Discontinued  *Topic was postponed. The date shown is not the original due date.    Advanced directives: Advance directive discussed with you today. Even though you declined this today, please call our office should you change your mind, and we can give you the proper paperwork for you to fill out.   Conditions/risks identified: Aim for 30 minutes of exercise or brisk walking, 6-8 glasses of water, and 5 servings of fruits and vegetables each day.   Next appointment: Follow up in one year for your annual wellness visit 05/25/24 @ 1pm telephone call   Preventive Care 65 Years and Older, Female Preventive care refers to lifestyle choices and visits with your health care provider that can promote health and wellness. What does preventive care include? A yearly physical exam. This is also called an annual well check. Dental exams once or twice a year. Routine eye exams. Ask your health care provider how  often you should have your eyes checked. Personal lifestyle choices, including: Daily care of your teeth and gums. Regular physical activity. Eating a healthy diet. Avoiding tobacco and drug use. Limiting alcohol use. Practicing safe sex. Taking low-dose aspirin every day. Taking vitamin and mineral supplements as recommended by your health care provider. What happens during an annual well check? The services and screenings done by your health care provider during your annual well check will depend on your age, overall health, lifestyle risk factors, and family history of disease. Counseling  Your health care provider may ask you questions about your: Alcohol use. Tobacco use. Drug use. Emotional well-being. Home and relationship well-being. Sexual activity. Eating habits. History of falls. Memory and ability to understand (cognition). Work and work Astronomer. Reproductive health. Screening  You may have the following tests or measurements: Height, weight, and BMI. Blood pressure. Lipid and cholesterol levels. These may be checked every 5 years, or more frequently if you are over 29 years old. Skin check. Lung cancer screening. You may have this screening every year starting at age 50 if you have a 30-pack-year history of smoking and currently smoke or have quit within the past 15 years. Fecal occult blood test (FOBT) of the stool. You may have this test every year starting at age  50. Flexible sigmoidoscopy or colonoscopy. You may have a sigmoidoscopy every 5 years or a colonoscopy every 10 years starting at age 39. Hepatitis C blood test. Hepatitis B blood test. Sexually transmitted disease (STD) testing. Diabetes screening. This is done by checking your blood sugar (glucose) after you have not eaten for a while (fasting). You may have this done every 1-3 years. Bone density scan. This is done to screen for osteoporosis. You may have this done starting at age 86. Mammogram.  This may be done every 1-2 years. Talk to your health care provider about how often you should have regular mammograms. Talk with your health care provider about your test results, treatment options, and if necessary, the need for more tests. Vaccines  Your health care provider may recommend certain vaccines, such as: Influenza vaccine. This is recommended every year. Tetanus, diphtheria, and acellular pertussis (Tdap, Td) vaccine. You may need a Td booster every 10 years. Zoster vaccine. You may need this after age 37. Pneumococcal 13-valent conjugate (PCV13) vaccine. One dose is recommended after age 16. Pneumococcal polysaccharide (PPSV23) vaccine. One dose is recommended after age 70. Talk to your health care provider about which screenings and vaccines you need and how often you need them. This information is not intended to replace advice given to you by your health care provider. Make sure you discuss any questions you have with your health care provider. Document Released: 11/29/2015 Document Revised: 07/22/2016 Document Reviewed: 09/03/2015 Elsevier Interactive Patient Education  2017 [employer].  Fall Prevention in the Home Falls can cause injuries. They can happen to people of all ages. There are many things you can do to make your home safe and to help prevent falls. What can I do on the outside of my home? Regularly fix the edges of walkways and driveways and fix any cracks. Remove anything that might make you trip as you walk through a door, such as a raised step or threshold. Trim any bushes or trees on the path to your home. Use bright outdoor lighting. Clear any walking paths of anything that might make someone trip, such as rocks or tools. Regularly check to see if handrails are loose or broken. Make sure that both sides of any steps have handrails. Any raised decks and porches should have guardrails on the edges. Have any leaves, snow, or ice cleared regularly. Use sand  or salt on walking paths during winter. Clean up any spills in your garage right away. This includes oil or grease spills. What can I do in the bathroom? Use night lights. Install grab bars by the toilet and in the tub and shower. Do not use towel bars as grab bars. Use non-skid mats or decals in the tub or shower. If you need to sit down in the shower, use a plastic, non-slip stool. Keep the floor dry. Clean up any water that spills on the floor as soon as it happens. Remove soap buildup in the tub or shower regularly. Attach bath mats securely with double-sided non-slip rug tape. Do not have throw rugs and other things on the floor that can make you trip. What can I do in the bedroom? Use night lights. Make sure that you have a light by your bed that is easy to reach. Do not use any sheets or blankets that are too big for your bed. They should not hang down onto the floor. Have a firm chair that has side arms. You can use this for support while you  get dressed. Do not have throw rugs and other things on the floor that can make you trip. What can I do in the kitchen? Clean up any spills right away. Avoid walking on wet floors. Keep items that you use a lot in easy-to-reach places. If you need to reach something above you, use a strong step stool that has a grab bar. Keep electrical cords out of the way. Do not use floor polish or wax that makes floors slippery. If you must use wax, use non-skid floor wax. Do not have throw rugs and other things on the floor that can make you trip. What can I do with my stairs? Do not leave any items on the stairs. Make sure that there are handrails on both sides of the stairs and use them. Fix handrails that are broken or loose. Make sure that handrails are as long as the stairways. Check any carpeting to make sure that it is firmly attached to the stairs. Fix any carpet that is loose or worn. Avoid having throw rugs at the top or bottom of the stairs.  If you do have throw rugs, attach them to the floor with carpet tape. Make sure that you have a light switch at the top of the stairs and the bottom of the stairs. If you do not have them, ask someone to add them for you. What else can I do to help prevent falls? Wear shoes that: Do not have high heels. Have rubber bottoms. Are comfortable and fit you well. Are closed at the toe. Do not wear sandals. If you use a stepladder: Make sure that it is fully opened. Do not climb a closed stepladder. Make sure that both sides of the stepladder are locked into place. Ask someone to hold it for you, if possible. Clearly mark and make sure that you can see: Any grab bars or handrails. First and last steps. Where the edge of each step is. Use tools that help you move around (mobility aids) if they are needed. These include: Canes. Walkers. Scooters. Crutches. Turn on the lights when you go into a dark area. Replace any light bulbs as soon as they burn out. Set up your furniture so you have a clear path. Avoid moving your furniture around. If any of your floors are uneven, fix them. If there are any pets around you, be aware of where they are. Review your medicines with your doctor. Some medicines can make you feel dizzy. This can increase your chance of falling. Ask your doctor what other things that you can do to help prevent falls. This information is not intended to replace advice given to you by your health care provider. Make sure you discuss any questions you have with your health care provider. Document Released: 08/29/2009 Document Revised: 04/09/2016 Document Reviewed: 12/07/2014 Elsevier Interactive Patient Education  2017 [employer].

## 2023-05-26 ENCOUNTER — Ambulatory Visit
Admission: RE | Admit: 2023-05-26 | Discharge: 2023-05-26 | Disposition: A | Discharge status: Home / Self Care | Payer: Medicare Other | Source: Ambulatory Visit | Attending: Orthopedic Surgery | Admitting: Orthopedic Surgery

## 2023-05-26 DIAGNOSIS — M5126 Other intervertebral disc displacement, lumbar region: Secondary | ICD-10-CM | POA: Diagnosis not present

## 2023-05-26 DIAGNOSIS — M5136 Other intervertebral disc degeneration, lumbar region: Secondary | ICD-10-CM | POA: Diagnosis not present

## 2023-05-26 DIAGNOSIS — Z9889 Other specified postprocedural states: Secondary | ICD-10-CM | POA: Diagnosis not present

## 2023-05-26 DIAGNOSIS — S39012D Strain of muscle, fascia and tendon of lower back, subsequent encounter: Secondary | ICD-10-CM | POA: Diagnosis not present

## 2023-05-26 DIAGNOSIS — M5416 Radiculopathy, lumbar region: Secondary | ICD-10-CM | POA: Diagnosis not present

## 2023-05-26 DIAGNOSIS — M5135 Other intervertebral disc degeneration, thoracolumbar region: Secondary | ICD-10-CM | POA: Diagnosis not present

## 2023-05-26 DIAGNOSIS — M47816 Spondylosis without myelopathy or radiculopathy, lumbar region: Secondary | ICD-10-CM | POA: Insufficient documentation

## 2023-05-26 DIAGNOSIS — M25562 Pain in left knee: Secondary | ICD-10-CM | POA: Diagnosis not present

## 2023-05-26 DIAGNOSIS — R262 Difficulty in walking, not elsewhere classified: Secondary | ICD-10-CM | POA: Diagnosis not present

## 2023-05-31 ENCOUNTER — Ambulatory Visit (AMBULATORY_SURGERY_CENTER): Payer: Medicare Other

## 2023-05-31 VITALS — Ht 62.5 in | Wt 130.0 lb

## 2023-05-31 DIAGNOSIS — Z800 Family history of malignant neoplasm of digestive organs: Secondary | ICD-10-CM

## 2023-05-31 DIAGNOSIS — M25562 Pain in left knee: Secondary | ICD-10-CM | POA: Diagnosis not present

## 2023-05-31 DIAGNOSIS — S39012D Strain of muscle, fascia and tendon of lower back, subsequent encounter: Secondary | ICD-10-CM | POA: Diagnosis not present

## 2023-05-31 DIAGNOSIS — Z86010 Personal history of colonic polyps: Secondary | ICD-10-CM

## 2023-05-31 DIAGNOSIS — R262 Difficulty in walking, not elsewhere classified: Secondary | ICD-10-CM | POA: Diagnosis not present

## 2023-05-31 MED ORDER — NA SULFATE-K SULFATE-MG SULF 17.5-3.13-1.6 GM/177ML PO SOLN: 1.0000 | Freq: Once | ORAL | 0 refills | Status: AC

## 2023-05-31 NOTE — Progress Notes (Signed)

## 2023-06-14 ENCOUNTER — Telehealth: Payer: Self-pay | Admitting: Orthopedic Surgery

## 2023-06-14 NOTE — Telephone Encounter (Signed)
-----   Message from Cristin R sent at 06/10/2023  3:49 PM EDT ----- Left message to call back ----- Message ----- From: Rockey Situ Sent: 06/09/2023  11:22 AM EDT To: [facility]  Left message to call back ----- Message ----- From: Rockey Situ Sent: 06/04/2023   3:16 PM EDT To: Rockey Situ  Left message to call back ----- Message ----- From: Rockey Situ Sent: 06/03/2023   2:37 PM EDT To: Rockey Situ  Left message to call back ----- Message ----- From: Drake Leach, PA-C Sent: 06/03/2023   2:07 PM EDT To: [facility]  Please schedule her for phone visit to review her lumbar MRI.   Thanks.

## 2023-06-16 NOTE — Progress Notes (Signed)
Telephone Visit- Progress Note: Referring Physician:  No referring provider defined for this encounter.  Primary Physician:  Doreene Nest, NP  This visit was performed via telephone.  Patient location: home Provider location: office  I spent a total of 15 minutes non-face-to-face activities for this visit on the date of this encounter including review of current clinical condition and response to treatment.    Patient has given verbal consent to this telephone visits and we reviewed the limitations of a telephone visit. Patient wishes to proceed.    Chief Complaint:  review MRI results  History of Present Illness: Kayla Gallegos is a 72 y.o. female has a history of GERD, IBS, hyperlipidemia.    She has history of MIS left L4-L5 laminectomy/foraminotomy in 2019 at [facility]. She improved after surgery, but looks like she started having increased back and right leg pain about a year later. She started HEP and this improved.    Last seen by me on 05/10/23 for 01/08/23 for constant LBP with right leg pain. 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   Lumbar MRI was ordered and phone visit scheduled to review the results.   She just got back 2 days ago from helping her daughter move to [place]. Her pain has been worse, but is improving since she got home.   Her LBP is better and is intermittent now and her right leg pain is gone. She has constant left lateral/posterior leg pain to her knee. She notes intermittent tingling. No weakness or numbness. Pain is worse with standing and walking.   She has been taking motrin with some relief.   Bowel/Bladder Dysfunction: none   Conservative measures:  Physical therapy: is going to [facility] for her back.  Multimodal medical therapy including regular antiinflammatories: naproxen , Ibuprofen over the counter Injections: no recent epidural steroid injections   Past Surgery:  MIS left L4-L5 laminectomy/foraminotomy in 2019 at  [facility]   Exam: No exam done as this was a telephone encounter.     Imaging: MRI of lumbar spine dated 05/26/23:  FINDINGS: Segmentation:  Standard.   Alignment:  2 mm retrolisthesis of L3 on L4.   Vertebrae: No acute fracture, evidence of discitis, or aggressive bone lesion.   Conus medullaris and cauda equina: Conus extends to the L1 level. Conus and cauda equina appear normal.   Paraspinal and other soft tissues: No acute paraspinal abnormality.   Disc levels:   Disc spaces: Mild disc desiccation throughout the lumbar spine.   T12-L1: Mild broad-based disc bulge. Mild bilateral facet arthropathy. No foraminal or central canal stenosis.   L1-L2: Mild broad-based disc bulge. Mild bilateral facet arthropathy. No foraminal or central canal stenosis.   L2-L3: Mild broad-based disc bulge. Mild bilateral facet arthropathy. Mild spinal stenosis. No foraminal stenosis.   L3-L4: Broad-based disc bulge. Mild bilateral facet arthropathy. Mild spinal stenosis. No foraminal stenosis.   L4-L5: Broad-based disc bulge. Moderate bilateral facet arthropathy. Right subarticular recess stenosis. Mild spinal stenosis. No foraminal stenosis.   L5-S1: Broad-based disc bulge with a small central annular fissure. Severe bilateral facet arthropathy. Bilateral lateral recess narrowing. No spinal stenosis. No foraminal stenosis.   IMPRESSION: 1. Lumbar spine spondylosis as described above. 2. No acute osseous injury of the lumbar spine.     Electronically Signed   By: Elige Ko M.D.   On: 06/03/2023 13:46  I have personally reviewed the images and agree with the above interpretation.  Assessment and Plan: Kayla Gallegos is a pleasant 72 y.o. female with has a history of MIS left L4-L5 laminectomy/foraminotomy in 2019 at [facility].   Her LBP is better and is intermittent now and her right leg pain is gone. She has constant left lateral/posterior leg pain to her knee. She notes intermittent  tingling. No weakness or numbness. Pain is worse with standing and walking.   She has known diffuse lumbar spondylosis. 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 She has severe facet hypertrophy L4-S1.   LBP likely from facets. Right leg pain was likely from L4-L5 and left left pain is likely from L5-S1.    Treatment options discussed with patient and following plan made:   - Recommend lumbar injections. She declines for now.  - Continue with PT at [facility]. Call if she wants to change locations.  - Continue on motrin. Reviewed dosing and side effects. Take with food.  - New prescription for robaxin to use prn spasms. Reviewed dosing and side effects.  - Discussed trial of neurontin. She declines.  - Follow up with me in 6-8 weeks and prn.   Drake Leach PA-C Neurosurgery

## 2023-06-17 ENCOUNTER — Telehealth: Payer: Self-pay | Admitting: Gastroenterology

## 2023-06-17 ENCOUNTER — Other Ambulatory Visit: Payer: Self-pay

## 2023-06-17 DIAGNOSIS — Z86010 Personal history of colonic polyps: Secondary | ICD-10-CM

## 2023-06-17 MED ORDER — NA SULFATE-K SULFATE-MG SULF 17.5-3.13-1.6 GM/177ML PO SOLN: 1.0000 | Freq: Once | ORAL | 0 refills | Status: AC

## 2023-06-17 NOTE — Telephone Encounter (Signed)
Inbound call from patient requesting a new prep due to cost. Please advise.   Thank you

## 2023-06-17 NOTE — Telephone Encounter (Signed)
Called patient back about prep.  Moved prep to [facility] and advised patient to use Good RX coupon. Patient verbally understood.

## 2023-06-18 ENCOUNTER — Encounter: Payer: Self-pay | Admitting: Orthopedic Surgery

## 2023-06-18 ENCOUNTER — Ambulatory Visit: Payer: Medicare Other | Admitting: Orthopedic Surgery

## 2023-06-18 DIAGNOSIS — M47816 Spondylosis without myelopathy or radiculopathy, lumbar region: Secondary | ICD-10-CM

## 2023-06-18 DIAGNOSIS — M4726 Other spondylosis with radiculopathy, lumbar region: Secondary | ICD-10-CM | POA: Diagnosis not present

## 2023-06-18 DIAGNOSIS — M4807 Spinal stenosis, lumbosacral region: Secondary | ICD-10-CM

## 2023-06-18 DIAGNOSIS — Z9889 Other specified postprocedural states: Secondary | ICD-10-CM

## 2023-06-18 DIAGNOSIS — M5416 Radiculopathy, lumbar region: Secondary | ICD-10-CM

## 2023-06-18 DIAGNOSIS — M5136 Other intervertebral disc degeneration, lumbar region: Secondary | ICD-10-CM

## 2023-06-18 MED ORDER — METHOCARBAMOL 500 MG PO TABS: 500.0000 mg | ORAL_TABLET | Freq: Three times a day (TID) | ORAL | 0 refills | Status: DC | PRN

## 2023-06-23 DIAGNOSIS — S39012D Strain of muscle, fascia and tendon of lower back, subsequent encounter: Secondary | ICD-10-CM | POA: Diagnosis not present

## 2023-06-23 DIAGNOSIS — M25562 Pain in left knee: Secondary | ICD-10-CM | POA: Diagnosis not present

## 2023-06-23 DIAGNOSIS — R262 Difficulty in walking, not elsewhere classified: Secondary | ICD-10-CM | POA: Diagnosis not present

## 2023-06-25 DIAGNOSIS — R262 Difficulty in walking, not elsewhere classified: Secondary | ICD-10-CM | POA: Diagnosis not present

## 2023-06-25 DIAGNOSIS — S39012D Strain of muscle, fascia and tendon of lower back, subsequent encounter: Secondary | ICD-10-CM | POA: Diagnosis not present

## 2023-06-25 DIAGNOSIS — M25562 Pain in left knee: Secondary | ICD-10-CM | POA: Diagnosis not present

## 2023-06-28 DIAGNOSIS — S39012D Strain of muscle, fascia and tendon of lower back, subsequent encounter: Secondary | ICD-10-CM | POA: Diagnosis not present

## 2023-06-28 DIAGNOSIS — M25562 Pain in left knee: Secondary | ICD-10-CM | POA: Diagnosis not present

## 2023-06-28 DIAGNOSIS — R262 Difficulty in walking, not elsewhere classified: Secondary | ICD-10-CM | POA: Diagnosis not present

## 2023-07-01 ENCOUNTER — Ambulatory Visit (AMBULATORY_SURGERY_CENTER): Payer: Medicare Other | Admitting: Gastroenterology

## 2023-07-01 ENCOUNTER — Encounter: Payer: Self-pay | Admitting: Gastroenterology

## 2023-07-01 VITALS — BP 123/66 | HR 80 | Temp 97.1°F | Resp 10 | Ht 62.5 in | Wt 140.0 lb

## 2023-07-01 DIAGNOSIS — K635 Polyp of colon: Secondary | ICD-10-CM | POA: Diagnosis not present

## 2023-07-01 DIAGNOSIS — Z800 Family history of malignant neoplasm of digestive organs: Secondary | ICD-10-CM

## 2023-07-01 DIAGNOSIS — F419 Anxiety disorder, unspecified: Secondary | ICD-10-CM | POA: Diagnosis not present

## 2023-07-01 DIAGNOSIS — D123 Benign neoplasm of transverse colon: Secondary | ICD-10-CM | POA: Diagnosis not present

## 2023-07-01 DIAGNOSIS — Z86010 Personal history of colonic polyps: Secondary | ICD-10-CM

## 2023-07-01 DIAGNOSIS — Z09 Encounter for follow-up examination after completed treatment for conditions other than malignant neoplasm: Secondary | ICD-10-CM | POA: Diagnosis not present

## 2023-07-01 MED ORDER — SODIUM CHLORIDE 0.9 % IV SOLN: 500.0000 mL | Freq: Once | INTRAVENOUS | Status: DC

## 2023-07-01 MED ORDER — DICYCLOMINE HCL 10 MG PO CAPS: 10.0000 mg | ORAL_CAPSULE | Freq: Three times a day (TID) | ORAL | 0 refills | Status: DC | PRN

## 2023-07-01 NOTE — Patient Instructions (Signed)
Handout on hemorrhoids, diverticulosis, and polyps given to patient Await pathology results Resume previous diet and continue present medications Repeat colonoscopy in 5 years for surveillance    YOU HAD AN ENDOSCOPIC PROCEDURE TODAY AT THE [facility]:   Refer to the procedure report that was given to you for any specific questions about what was found during the examination.  If the procedure report does not answer your questions, please call your gastroenterologist to clarify.  If you requested that your care partner not be given the details of your procedure findings, then the procedure report has been included in a sealed envelope for you to review at your convenience later.  YOU SHOULD EXPECT: Some feelings of bloating in the abdomen. Passage of more gas than usual.  Walking can help get rid of the air that was put into your GI tract during the procedure and reduce the bloating. If you had a lower endoscopy (such as a colonoscopy or flexible sigmoidoscopy) you may notice spotting of blood in your stool or on the toilet paper. If you underwent a bowel prep for your procedure, you may not have a normal bowel movement for a few days.  Please Note:  You might notice some irritation and congestion in your nose or some drainage.  This is from the oxygen used during your procedure.  There is no need for concern and it should clear up in a day or so.  SYMPTOMS TO REPORT IMMEDIATELY:  Following lower endoscopy (colonoscopy or flexible sigmoidoscopy):  Excessive amounts of blood in the stool  Significant tenderness or worsening of abdominal pains  Swelling of the abdomen that is new, acute  Fever of 100F or higher  For urgent or emergent issues, a gastroenterologist can be reached at any hour by calling [phone]. Do not use MyChart messaging for urgent concerns.    DIET:  We do recommend a small meal at first, but then you may proceed to your regular diet.  Drink plenty of  fluids but you should avoid alcoholic beverages for 24 hours.  ACTIVITY:  You should plan to take it easy for the rest of today and you should NOT DRIVE or use heavy machinery until tomorrow (because of the sedation medicines used during the test).    FOLLOW UP: Our staff will call the number listed on your records the next business day following your procedure.  We will call around 7:15- 8:00 am to check on you and address any questions or concerns that you may have regarding the information given to you following your procedure. If we do not reach you, we will leave a message.     If any biopsies were taken you will be contacted by phone or by letter within the next 1-3 weeks.  Please call us at [phone] if you have not heard about the biopsies in 3 weeks.    SIGNATURES/CONFIDENTIALITY: You and/or your care partner have signed paperwork which will be entered into your electronic medical record.  These signatures attest to the fact that that the information above on your After Visit Summary has been reviewed and is understood.  Full responsibility of the confidentiality of this discharge information lies with you and/or your care-partner.

## 2023-07-01 NOTE — Progress Notes (Signed)
[facility] History and Physical   Primary Care Physician:  Doreene Nest, NP   Reason for Procedure:  History of adenomatous colon polyps, family h/o colon cancer  Plan:    Surveillance colonoscopy with possible interventions as needed     HPI: Kayla Gallegos is a very pleasant 72 y.o. female here for surveillance colonoscopy. Denies any nausea, vomiting, abdominal pain, melena or bright red blood per rectum  The risks and benefits as well as alternatives of endoscopic procedure(s) have been discussed and reviewed. All questions answered. The patient agrees to proceed.    Past Medical History:  Diagnosis Date   Allergy    Anxiety    Cataract    bil cateracts removed   DIVERTICULOSIS, COLON 05/29/2008   Qualifier: Diagnosis of  By: Jonny Ruiz MD, Len Blalock    GERD (gastroesophageal reflux disease)    Hyperlipidemia    Kidney stones    Laceration of skin of thigh, right, sequela 08/14/2020   PARESTHESIA 04/01/2009   Qualifier: Diagnosis of  By: Jonny Ruiz MD, Len Blalock    Recurrent UTI (urinary tract infection)     Past Surgical History:  Procedure Laterality Date   ABDOMINAL HYSTERECTOMY  1976   APPENDECTOMY     BACK SURGERY  08/2018   [facility]   COLONOSCOPY     LAPAROSCOPIC CHOLECYSTECTOMY  1990   TONSILLECTOMY     UPPER GASTROINTESTINAL ENDOSCOPY      Prior to Admission medications   Medication Sig Start Date End Date Taking? Authorizing Provider  Acetylcysteine (NAC 600 PO) Take by mouth.    [provider]  ascorbic acid (VITAMIN C) 500 MG tablet Take by mouth daily.    [provider]  B Complex Vitamins (VITAMIN B-COMPLEX) TABS Take by mouth. 12/27/19   [provider]  estradiol (ESTRACE) 0.1 MG/GM vaginal cream Estrogen Cream Instruction Discard applicator Apply pea sized amount to tip of finger to urethra before bed. Wash hands well after application. Use Monday, Wednesday and Friday Patient not taking: Reported on 05/31/2023  01/15/23   Vanna Scotland, MD  FOLIC ACID PO Take by mouth daily.    [provider]  Providence Lanius (OMEGA-3) 500 MG CAPS Take by mouth.    [provider]  Magnesium Carbonate, Antacid, (MAGNESIUM CARBONATE PO) Take by mouth. Patient not taking: Reported on 05/25/2023    [provider]  methocarbamol (ROBAXIN) 500 MG tablet Take 1 tablet (500 mg total) by mouth every 8 (eight) hours as needed for muscle spasms. Patient not taking: Reported on 07/01/2023 06/18/23   Drake Leach, PA-C  Multiple Vitamin (MULTIVITAMIN ADULT PO) Take by mouth. Bone Renewal Takes Twice weekly    [provider]  Policos-Garlic-Plant Sterols (PLANT STEROL CHOLESTEROL CONT PO) Take by mouth as needed (after eating fried foods).    [provider]  Probiotic Product (PROBIOTIC DAILY PO) Take by mouth daily.    [provider]  Vitamin D-Vitamin K (K2 PLUS D3) [phone] MCG-UNIT TABS Take by mouth.    [provider]  Zinc Sulfate 66 MG TABS Take by mouth. 12/27/19   [provider]    Current Outpatient Medications  Medication Sig Dispense Refill   Acetylcysteine (NAC 600 PO) Take by mouth.     ascorbic acid (VITAMIN C) 500 MG tablet Take by mouth daily.     B Complex Vitamins (VITAMIN B-COMPLEX) TABS Take by mouth.     estradiol (ESTRACE) 0.1 MG/GM vaginal cream Estrogen Cream Instruction  Discard applicator Apply pea sized amount to tip of finger to urethra before bed. Wash hands well after application. Use Monday, Wednesday and Friday (Patient not taking: Reported on 05/31/2023) 42.5 g 12   FOLIC ACID PO Take by mouth daily.     Krill Oil (OMEGA-3) 500 MG CAPS Take by mouth.     Magnesium Carbonate, Antacid, (MAGNESIUM CARBONATE PO) Take by mouth. (Patient not taking: Reported on 05/25/2023)     methocarbamol (ROBAXIN) 500 MG tablet Take 1 tablet (500 mg total) by mouth every 8 (eight) hours as needed for muscle spasms. (Patient not taking: Reported on  07/01/2023) 60 tablet 0   Multiple Vitamin (MULTIVITAMIN ADULT PO) Take by mouth. Bone Renewal Takes Twice weekly     Policos-Garlic-Plant Sterols (PLANT STEROL CHOLESTEROL CONT PO) Take by mouth as needed (after eating fried foods).     Probiotic Product (PROBIOTIC DAILY PO) Take by mouth daily.     Vitamin D-Vitamin K (K2 PLUS D3) [phone] MCG-UNIT TABS Take by mouth.     Zinc Sulfate 66 MG TABS Take by mouth.     Current Facility-Administered Medications  Medication Dose Route Frequency Provider Last Rate Last Admin   0.9 %  sodium chloride infusion  500 mL Intravenous Once Napoleon Form, MD        Allergies as of 07/01/2023 - Review Complete 07/01/2023  Allergen Reaction Noted   Prednisone Shortness Of Breath 05/31/2023   Macrobid [nitrofurantoin] Other (See Comments) 12/22/2022   Codeine Nausea And Vomiting and Nausea Only 11/17/2012    Family History  Problem Relation Age of Onset   Colon cancer Sister 77   Colon cancer Brother 18   Stomach cancer Neg Hx    Esophageal cancer Neg Hx    Rectal cancer Neg Hx     Social History   Socioeconomic History   Marital status: Married    Spouse name: Not on file   Number of children: Not on file   Years of education: Not on file   Highest education level: Not on file  Occupational History   Not on file  Tobacco Use   Smoking status: Never   Smokeless tobacco: Never  Vaping Use   Vaping status: Never Used  Substance and Sexual Activity   Alcohol use: No   Drug use: No   Sexual activity: Yes    Birth control/protection: Surgical  Other Topics Concern   Not on file  Social History Narrative   Not on file   Social Determinants of Health   Financial Resource Strain: Low Risk  (05/25/2023)   Overall Financial Resource Strain (CARDIA)    Difficulty of Paying Living Expenses: Not hard at all  Food Insecurity: No Food Insecurity (05/25/2023)   Hunger Vital Sign    Worried About Running Out of Food in the Last Year: Never  true    Ran Out of Food in the Last Year: Never true  Transportation Needs: No Transportation Needs (05/25/2023)   PRAPARE - Administrator, Civil Service (Medical): No    Lack of Transportation (Non-Medical): No  Physical Activity: Sufficiently Active (05/25/2023)   Exercise Vital Sign    Days of Exercise per Week: 7 days    Minutes of Exercise per Session: 120 min  Stress: No Stress Concern Present (05/25/2023)   [employer] of Occupational Health - Occupational Stress Questionnaire    Feeling of Stress : Not at all  Social Connections: Moderately Isolated (05/25/2023)   Social Connection  and Isolation Panel [NHANES]    Frequency of Communication with Friends and Family: More than three times a week    Frequency of Social Gatherings with Friends and Family: More than three times a week    Attends Religious Services: Never    Database administrator or Organizations: No    Attends Banker Meetings: Never    Marital Status: Married  Catering manager Violence: Not At Risk (05/25/2023)   Humiliation, Afraid, Rape, and Kick questionnaire    Fear of Current or Ex-Partner: No    Emotionally Abused: No    Physically Abused: No    Sexually Abused: No    Review of Systems:  All other review of systems negative except as mentioned in the HPI.  Physical Exam: Vital signs in last 24 hours: BP 136/73   Pulse 79   Temp (!) 97.1 F (36.2 C) (Temporal)   Ht 5' 2.5" (1.588 m)   Wt 140 lb (63.5 kg)   SpO2 100%   BMI 25.20 kg/m  General:   Alert, NAD Lungs:  Clear .   Heart:  Regular rate and rhythm Abdomen:  Soft, nontender and nondistended. Neuro/Psych:  Alert and cooperative. Normal mood and affect. A and O x 3  Reviewed labs, radiology imaging, old records and pertinent past GI work up  Patient is appropriate for planned procedure(s) and anesthesia in an ambulatory setting   K. Scherry Ran , MD [phone]

## 2023-07-01 NOTE — Op Note (Signed)
[facility] Patient Name: Kayla Gallegos Procedure Date: 07/01/2023 9:15 AM MRN: [id number] Endoscopist: Napoleon Form , MD, [id number] Age: 72 Referring MD:  Date of Birth: 1951-10-10 Gender: Female Account #: [id number] Procedure:                Colonoscopy Indications:              High risk colon cancer surveillance: Personal                            history of colonic polyps, High risk colon cancer                            surveillance: Personal history of adenoma less than                            10 mm in size Medicines:                Monitored Anesthesia Care Procedure:                Pre-Anesthesia Assessment:                           - Prior to the procedure, a History and Physical                            was performed, and patient medications and                            allergies were reviewed. The patient's tolerance of                            previous anesthesia was also reviewed. The risks                            and benefits of the procedure and the sedation                            options and risks were discussed with the patient.                            All questions were answered, and informed consent                            was obtained. Prior Anticoagulants: The patient has                            taken no anticoagulant or antiplatelet agents. ASA                            Grade Assessment: II - A patient with mild systemic                            disease. After reviewing the risks and benefits,  the patient was deemed in satisfactory condition to                            undergo the procedure.                           After obtaining informed consent, the colonoscope                            was passed under direct vision. Throughout the                            procedure, the patient's blood pressure, pulse, and                            oxygen saturations were monitored  continuously. The                            [id number]-HQ190L Colonoscope [id number] was introduced                            through the anus and advanced to the the cecum,                            identified by appendiceal orifice and ileocecal                            valve. The colonoscopy was performed without                            difficulty. The patient tolerated the procedure                            well. The quality of the bowel preparation was                            good. The ileocecal valve, appendiceal orifice, and                            rectum were photographed. Scope In: 9:21:48 AM Scope Out: 9:39:55 AM Scope Withdrawal Time: 0 hours 12 minutes 5 seconds  Total Procedure Duration: 0 hours 18 minutes 7 seconds  Findings:                 The perianal and digital rectal examinations were                            normal.                           A 5 mm polyp was found in the transverse colon. The                            polyp was sessile. The polyp was removed with a  cold snare. Resection and retrieval were complete.                           Scattered large-mouthed, medium-mouthed and                            small-mouthed diverticula were found in the sigmoid                            colon and descending colon. There was narrowing of                            the colon in association with the diverticular                            opening. There was evidence of diverticular spasm.                           Non-bleeding external and internal hemorrhoids were                            found during retroflexion. The hemorrhoids were                            medium-sized. Complications:            No immediate complications. Estimated Blood Loss:     Estimated blood loss was minimal. Impression:               - One 5 mm polyp in the transverse colon, removed                            with a cold snare. Resected and  retrieved.                           - Severe diverticulosis in the sigmoid colon and in                            the descending colon. There was narrowing of the                            colon in association with the diverticular opening.                            There was evidence of diverticular spasm.                           - Non-bleeding external and internal hemorrhoids. Recommendation:           - Patient has a contact number available for                            emergencies. The signs and symptoms of potential  delayed complications were discussed with the                            patient. Return to normal activities tomorrow.                            Written discharge instructions were provided to the                            patient.                           - Resume previous diet.                           - Continue present medications.                           - Await pathology results.                           - Repeat colonoscopy in 5 years for surveillance. Napoleon Form, MD 07/01/2023 9:46:40 AM This report has been signed electronically.

## 2023-07-01 NOTE — Progress Notes (Signed)
Pt's states no medical or surgical changes since previsit or office visit. 

## 2023-07-01 NOTE — Progress Notes (Signed)
Called to room to assist during endoscopic procedure.  Patient ID and intended procedure confirmed with present staff. Received instructions for my participation in the procedure from the performing physician.  

## 2023-07-01 NOTE — Progress Notes (Signed)
Sedate, gd SR, tolerated procedure well, VSS, report to RN 

## 2023-07-02 ENCOUNTER — Telehealth: Payer: Self-pay

## 2023-07-02 NOTE — Telephone Encounter (Signed)
  Follow up Call-     07/01/2023    8:16 AM  Call back number  Post procedure Call Back phone  # [phone]  Permission to leave phone message Yes     Patient questions:  Do you have a fever, pain , or abdominal swelling? No. Pain Score  0 *  Have you tolerated food without any problems? Yes.    Have you been able to return to your normal activities? Yes.    Do you have any questions about your discharge instructions: Diet   No. Medications  No. Follow up visit  No.  Do you have questions or concerns about your Care? No.  Actions: * If pain score is 4 or above: No action needed, pain <4.

## 2023-07-05 DIAGNOSIS — M25562 Pain in left knee: Secondary | ICD-10-CM | POA: Diagnosis not present

## 2023-07-05 DIAGNOSIS — R262 Difficulty in walking, not elsewhere classified: Secondary | ICD-10-CM | POA: Diagnosis not present

## 2023-07-05 DIAGNOSIS — S39012D Strain of muscle, fascia and tendon of lower back, subsequent encounter: Secondary | ICD-10-CM | POA: Diagnosis not present

## 2023-07-07 ENCOUNTER — Encounter: Payer: Self-pay | Admitting: Gastroenterology

## 2023-07-08 DIAGNOSIS — S39012D Strain of muscle, fascia and tendon of lower back, subsequent encounter: Secondary | ICD-10-CM | POA: Diagnosis not present

## 2023-07-08 DIAGNOSIS — R262 Difficulty in walking, not elsewhere classified: Secondary | ICD-10-CM | POA: Diagnosis not present

## 2023-07-08 DIAGNOSIS — M25562 Pain in left knee: Secondary | ICD-10-CM | POA: Diagnosis not present

## 2023-07-23 NOTE — Progress Notes (Unsigned)
Referring Physician:  Doreene Nest, NP [address] [place],  [place] [address]  Primary Physician:  Doreene Nest, NP  History of Present Illness: 07/23/2023 [name] has a history of GERD, IBS, hyperlipidemia.   She has history of MIS left L4-L5 laminectomy/foraminotomy in 2019 at [facility]. She improved after surgery, but looks like she started having increased back and right leg pain about a year later. She started HEP and this improved.    Did phone visit with me on 06/18/23 to review her lumbar MRI.   She has known diffuse lumbar spondylosis. 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 She has severe facet hypertrophy L4-S1.    LBP likely from facets. Right leg pain was likely from L4-L5 and left left pain is likely from L5-S1.   We discussed lumbar injections and she declined. She was to continue with PT at [facility]. She had motrin and robaxin to use prn. Discussed neurontin and she declined.   She is here for follow up.   She has intermittent LBP with constant left lateral/posterior leg pain to her knee. Pain is worse with prolonged standing and walking (more than 10 minutes). She notes intermittent tingling. No weakness or numbness. Pain is worse with standing and walking.    She has been taking motrin with some relief. Not taking the robaxin.   Her husband recently had shoulder surgery and her PT is on hold for now. She thinks it may be helping.   Bowel/Bladder Dysfunction: none  Conservative measures:  Physical therapy: going to PT at [facility]  Multimodal medical therapy including regular antiinflammatories: naproxen , Ibuprofen over the counter Injections: no recent epidural steroid injections  Past Surgery:  MIS left L4-L5 laminectomy/foraminotomy in 2019 at [facility] [name] has no symptoms of cervical myelopathy.  The symptoms are causing a significant impact on the patient's life.   Review of Systems:  A 10  point review of systems is negative, except for the pertinent positives and negatives detailed in the HPI.  Past Medical History: Past Medical History:  Diagnosis Date   Allergy    Anxiety    Cataract    bil cateracts removed   DIVERTICULOSIS, COLON 05/29/2008   Qualifier: Diagnosis of  By: Jonny Ruiz MD, Len Blalock    GERD (gastroesophageal reflux disease)    Hyperlipidemia    Kidney stones    Laceration of skin of thigh, right, sequela 08/14/2020   PARESTHESIA 04/01/2009   Qualifier: Diagnosis of  By: Jonny Ruiz MD, Len Blalock    Recurrent UTI (urinary tract infection)     Past Surgical History: Past Surgical History:  Procedure Laterality Date   ABDOMINAL HYSTERECTOMY  1976   APPENDECTOMY     BACK SURGERY  08/2018   [place]    COLONOSCOPY     LAPAROSCOPIC CHOLECYSTECTOMY  1990   TONSILLECTOMY     UPPER GASTROINTESTINAL ENDOSCOPY      Allergies: Allergies as of 07/29/2023 - Review Complete 07/01/2023  Allergen Reaction Noted   Prednisone Shortness Of Breath 05/31/2023   Macrobid [nitrofurantoin] Other (See Comments) 12/22/2022   Codeine Nausea And Vomiting and Nausea Only 11/17/2012    Medications: Outpatient Encounter Medications as of 07/29/2023  Medication Sig   Acetylcysteine (NAC 600 PO) Take by mouth.   ascorbic acid (VITAMIN C) 500 MG tablet Take by mouth daily.   B Complex Vitamins (VITAMIN B-COMPLEX) TABS Take by mouth.   dicyclomine (BENTYL) 10 MG  capsule Take 1 capsule (10 mg total) by mouth 3 (three) times daily between meals as needed for spasms.   estradiol (ESTRACE) 0.1 MG/GM vaginal cream Estrogen Cream Instruction Discard applicator Apply pea sized amount to tip of finger to urethra before bed. Wash hands well after application. Use Monday, Wednesday and Friday (Patient not taking: Reported on 05/31/2023)   FOLIC ACID PO Take by mouth daily.   Krill Oil (OMEGA-3) 500 MG CAPS Take by mouth.   Magnesium Carbonate, Antacid, (MAGNESIUM CARBONATE PO) Take by mouth.  (Patient not taking: Reported on 05/25/2023)   methocarbamol (ROBAXIN) 500 MG tablet Take 1 tablet (500 mg total) by mouth every 8 (eight) hours as needed for muscle spasms. (Patient not taking: Reported on 07/01/2023)   Multiple Vitamin (MULTIVITAMIN ADULT PO) Take by mouth. Bone Renewal Takes Twice weekly   Policos-Garlic-Plant Sterols (PLANT STEROL CHOLESTEROL CONT PO) Take by mouth as needed (after eating fried foods).   Probiotic Product (PROBIOTIC DAILY PO) Take by mouth daily.   Vitamin D-Vitamin K (K2 PLUS D3) [phone] MCG-UNIT TABS Take by mouth.   Zinc Sulfate 66 MG TABS Take by mouth.   No facility-administered encounter medications on file as of 07/29/2023.    Social History: Social History   Tobacco Use   Smoking status: Never   Smokeless tobacco: Never  Vaping Use   Vaping status: Never Used  Substance Use Topics   Alcohol use: No   Drug use: No    Family Medical History: Family History  Problem Relation Age of Onset   Colon cancer Sister 61   Colon cancer Brother 40   Stomach cancer Neg Hx    Esophageal cancer Neg Hx    Rectal cancer Neg Hx     Physical Examination: There were no vitals filed for this visit.  Awake, alert, oriented to person, place, and time.  Speech is clear and fluent. Fund of knowledge is appropriate.   Cranial Nerves: Pupils equal round and reactive to light.  Facial tone is symmetric.    Well healed lumbar incision. Mild tenderness at right SI joint.   No abnormal lesions on exposed skin.   Strength:  Side Iliopsoas Quads Hamstring PF DF EHL  R 5 5 5 5 5 5   L 5 5 5 5 5 5    Reflexes are 2+ and symmetric at the patella and achilles.   Clonus is not present.   Bilateral lower extremity sensation is intact to light touch.     Gait is normal.     Medical Decision Making  Imaging: None  Assessment and Plan: Kayla Gallegos is a pleasant 72 y.o. female who has a history of MIS left L4-L5 laminectomy/foraminotomy in 2019 at [facility].    She has intermittent LBP with constant left lateral/posterior leg pain to her knee. Pain is worse with prolonged standing and walking (more than 10 minutes). She notes intermittent tingling. No weakness or numbness. Pain is worse with standing and walking   She has known diffuse lumbar spondylosis. 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 She has severe facet hypertrophy L4-S1.    LBP likely from facets. Right leg pain was likely from L4-L5 and left left pain is likely from L5-S1.    Treatment options discussed with patient and following plan made:     - Continue with PT at [facility]. She will restart once husband recovers from shoulder surgery.  - Continue on motrin. Reviewed dosing and side effects. Take with  food. She has robaxin to use prn. She knows it can make her sleepy.  - Will start neurontin 100mg  q hs to increase to 200mg  q hs as tolerated. Reviewed dosing and side effects. Call if any concerns.  - New prescription for robaxin to use prn spasms. Reviewed dosing and side effects.  - Discussed lumbar injections. She declines for now.  - Follow up with me in 6-8 weeks and prn.   I spent a total of 20 minutes in face-to-face and non-face-to-face activities related to this patient's care today including review of outside records, review of imaging, review of symptoms, physical exam, discussion of differential diagnosis, discussion of treatment options, and documentation.   Drake Leach PA-C Dept. of Neurosurgery

## 2023-07-28 ENCOUNTER — Encounter: Payer: Self-pay | Admitting: Student in an Organized Health Care Education/Training Program

## 2023-07-28 ENCOUNTER — Ambulatory Visit (INDEPENDENT_AMBULATORY_CARE_PROVIDER_SITE_OTHER): Payer: Medicare Other | Admitting: Student in an Organized Health Care Education/Training Program

## 2023-07-28 VITALS — BP 146/76 | HR 71 | Temp 97.6°F | Ht 62.5 in | Wt 139.0 lb

## 2023-07-28 DIAGNOSIS — R911 Solitary pulmonary nodule: Secondary | ICD-10-CM

## 2023-07-28 NOTE — Progress Notes (Unsigned)
Synopsis: Referred in *** by Doreene Nest, NP  Assessment & Plan:   There are no diagnoses linked to this encounter.  No symptoms, didn't get the chest CT. Will get it scheduled to ensure nodule stability.  No follow-ups on file.  I spent *** minutes caring for this patient today, including {EM billing:[id number]}  Raechel Chute, MD [facility] 07/28/2023 1:52 PM    End of visit medications:  No orders of the defined types were placed in this encounter.    Current Outpatient Medications:    Acetylcysteine (NAC 600 PO), Take by mouth., Disp: , Rfl:    ascorbic acid (VITAMIN C) 500 MG tablet, Take by mouth daily., Disp: , Rfl:    B Complex Vitamins (VITAMIN B-COMPLEX) TABS, Take by mouth., Disp: , Rfl:    FOLIC ACID PO, Take by mouth daily., Disp: , Rfl:    Krill Oil (OMEGA-3) 500 MG CAPS, Take by mouth., Disp: , Rfl:    Magnesium Carbonate, Antacid, (MAGNESIUM CARBONATE PO), Take by mouth., Disp: , Rfl:    Multiple Vitamin (MULTIVITAMIN ADULT PO), Take by mouth. Bone Renewal Takes Twice weekly, Disp: , Rfl:    Policos-Garlic-Plant Sterols (PLANT STEROL CHOLESTEROL CONT PO), Take by mouth as needed (after eating fried foods)., Disp: , Rfl:    Probiotic Product (PROBIOTIC DAILY PO), Take by mouth daily., Disp: , Rfl:    Vitamin D-Vitamin K (K2 PLUS D3) [phone] MCG-UNIT TABS, Take by mouth., Disp: , Rfl:    Zinc Sulfate 66 MG TABS, Take by mouth., Disp: , Rfl:    dicyclomine (BENTYL) 10 MG capsule, Take 1 capsule (10 mg total) by mouth 3 (three) times daily between meals as needed for spasms. (Patient not taking: Reported on 07/28/2023), Disp: 30 capsule, Rfl: 0   estradiol (ESTRACE) 0.1 MG/GM vaginal cream, Estrogen Cream Instruction Discard applicator Apply pea sized amount to tip of finger to urethra before bed. Wash hands well after application. Use Monday, Wednesday and Friday (Patient not taking: Reported on 07/28/2023), Disp: 42.5 g, Rfl: 12    methocarbamol (ROBAXIN) 500 MG tablet, Take 1 tablet (500 mg total) by mouth every 8 (eight) hours as needed for muscle spasms. (Patient not taking: Reported on 07/28/2023), Disp: 60 tablet, Rfl: 0   Subjective:   PATIENT ID: Kayla Gallegos GENDER: female DOB: [birth year], MRN: [id number]  Chief Complaint  Patient presents with   Follow-up    HPI ***  Ancillary information including prior medications, full medical/surgical/family/social histories, and PFTs (when available) are listed below and have been reviewed.   ROS   Objective:  There were no vitals filed for this visit.   on *** LPM *** RA BMI Readings from Last 3 Encounters:  07/01/23 25.20 kg/m  05/31/23 23.40 kg/m  05/25/23 23.40 kg/m   Wt Readings from Last 3 Encounters:  07/01/23 140 lb (63.5 kg)  05/31/23 130 lb (59 kg)  05/25/23 130 lb (59 kg)    Physical Exam    Ancillary Information    Past Medical History:  Diagnosis Date   Allergy    Anxiety    Cataract    bil cateracts removed   DIVERTICULOSIS, COLON 05/29/2008   Qualifier: Diagnosis of  By: Jonny Ruiz MD, Len Blalock    GERD (gastroesophageal reflux disease)    Hyperlipidemia    Kidney stones    Laceration of skin of thigh, right, sequela 08/14/2020   PARESTHESIA 04/01/2009   Qualifier: Diagnosis of  By: Jonny Ruiz MD, Len Blalock  Recurrent UTI (urinary tract infection)      Family History  Problem Relation Age of Onset   Colon cancer Sister 46   Colon cancer Brother 61   Stomach cancer Neg Hx    Esophageal cancer Neg Hx    Rectal cancer Neg Hx      Past Surgical History:  Procedure Laterality Date   ABDOMINAL HYSTERECTOMY  1976   APPENDECTOMY     BACK SURGERY  08/2018   [facility] [place]   COLONOSCOPY     LAPAROSCOPIC CHOLECYSTECTOMY  1990   TONSILLECTOMY     UPPER GASTROINTESTINAL ENDOSCOPY      Social History   Socioeconomic History   Marital status: Married    Spouse name: Not on file   Number of children: Not on file   Years of  education: Not on file   Highest education level: Not on file  Occupational History   Not on file  Tobacco Use   Smoking status: Never   Smokeless tobacco: Never  Vaping Use   Vaping status: Never Used  Substance and Sexual Activity   Alcohol use: No   Drug use: No   Sexual activity: Yes    Birth control/protection: Surgical  Other Topics Concern   Not on file  Social History Narrative   Not on file   Social Determinants of Health   Financial Resource Strain: Low Risk  (05/25/2023)   Overall Financial Resource Strain (CARDIA)    Difficulty of Paying Living Expenses: Not hard at all  Food Insecurity: No Food Insecurity (05/25/2023)   Hunger Vital Sign    Worried About Running Out of Food in the Last Year: Never true    Ran Out of Food in the Last Year: Never true  Transportation Needs: No Transportation Needs (05/25/2023)   PRAPARE - Administrator, Civil Service (Medical): No    Lack of Transportation (Non-Medical): No  Physical Activity: Sufficiently Active (05/25/2023)   Exercise Vital Sign    Days of Exercise per Week: 7 days    Minutes of Exercise per Session: 120 min  Stress: No Stress Concern Present (05/25/2023)   [employer] of Occupational Health - Occupational Stress Questionnaire    Feeling of Stress : Not at all  Social Connections: Moderately Isolated (05/25/2023)   Social Connection and Isolation Panel [NHANES]    Frequency of Communication with Friends and Family: More than three times a week    Frequency of Social Gatherings with Friends and Family: More than three times a week    Attends Religious Services: Never    Database administrator or Organizations: No    Attends Banker Meetings: Never    Marital Status: Married  Catering manager Violence: Not At Risk (05/25/2023)   Humiliation, Afraid, Rape, and Kick questionnaire    Fear of Current or Ex-Partner: No    Emotionally Abused: No    Physically Abused: No    Sexually Abused: No      Allergies  Allergen Reactions   Prednisone Shortness Of Breath    Flushing, hot    Macrobid [Nitrofurantoin] Other (See Comments)    Elevated HR, Elevated BP, Headache, chills   Codeine Nausea And Vomiting and Nausea Only    dizziness  Other Reaction(s): GI Intolerance  dizziness, dizziness     CBC    Component Value Date/Time   WBC 5.8 12/24/2022 1037   RBC 4.54 12/24/2022 1037   HGB 13.8 12/24/2022 1037   HCT  40.3 12/24/2022 1037   PLT 282.0 12/24/2022 1037   MCV 88.6 12/24/2022 1037   MCH 29.8 12/20/2022 2245   MCHC 34.4 12/24/2022 1037   RDW 13.6 12/24/2022 1037   LYMPHSABS 1.8 12/24/2022 1037   MONOABS 0.4 12/24/2022 1037   EOSABS 0.1 12/24/2022 1037   BASOSABS 0.0 12/24/2022 1037    Pulmonary Functions Testing Results:     No data to display          Outpatient Medications Prior to Visit  Medication Sig Dispense Refill   Acetylcysteine (NAC 600 PO) Take by mouth.     ascorbic acid (VITAMIN C) 500 MG tablet Take by mouth daily.     B Complex Vitamins (VITAMIN B-COMPLEX) TABS Take by mouth.     FOLIC ACID PO Take by mouth daily.     Krill Oil (OMEGA-3) 500 MG CAPS Take by mouth.     Magnesium Carbonate, Antacid, (MAGNESIUM CARBONATE PO) Take by mouth.     Multiple Vitamin (MULTIVITAMIN ADULT PO) Take by mouth. Bone Renewal Takes Twice weekly     Policos-Garlic-Plant Sterols (PLANT STEROL CHOLESTEROL CONT PO) Take by mouth as needed (after eating fried foods).     Probiotic Product (PROBIOTIC DAILY PO) Take by mouth daily.     Vitamin D-Vitamin K (K2 PLUS D3) [phone] MCG-UNIT TABS Take by mouth.     Zinc Sulfate 66 MG TABS Take by mouth.     dicyclomine (BENTYL) 10 MG capsule Take 1 capsule (10 mg total) by mouth 3 (three) times daily between meals as needed for spasms. (Patient not taking: Reported on 07/28/2023) 30 capsule 0   estradiol (ESTRACE) 0.1 MG/GM vaginal cream Estrogen Cream Instruction Discard applicator Apply pea sized amount to tip of  finger to urethra before bed. Wash hands well after application. Use Monday, Wednesday and Friday (Patient not taking: Reported on 07/28/2023) 42.5 g 12   methocarbamol (ROBAXIN) 500 MG tablet Take 1 tablet (500 mg total) by mouth every 8 (eight) hours as needed for muscle spasms. (Patient not taking: Reported on 07/28/2023) 60 tablet 0   No facility-administered medications prior to visit.

## 2023-07-29 ENCOUNTER — Encounter: Payer: Self-pay | Admitting: Orthopedic Surgery

## 2023-07-29 ENCOUNTER — Ambulatory Visit (INDEPENDENT_AMBULATORY_CARE_PROVIDER_SITE_OTHER): Payer: Medicare Other | Admitting: Orthopedic Surgery

## 2023-07-29 VITALS — BP 132/80 | Ht 62.5 in | Wt 140.0 lb

## 2023-07-29 DIAGNOSIS — M48061 Spinal stenosis, lumbar region without neurogenic claudication: Secondary | ICD-10-CM | POA: Diagnosis not present

## 2023-07-29 DIAGNOSIS — M5416 Radiculopathy, lumbar region: Secondary | ICD-10-CM

## 2023-07-29 DIAGNOSIS — Z9889 Other specified postprocedural states: Secondary | ICD-10-CM

## 2023-07-29 DIAGNOSIS — M4726 Other spondylosis with radiculopathy, lumbar region: Secondary | ICD-10-CM | POA: Diagnosis not present

## 2023-07-29 DIAGNOSIS — M47816 Spondylosis without myelopathy or radiculopathy, lumbar region: Secondary | ICD-10-CM

## 2023-07-29 MED ORDER — GABAPENTIN 100 MG PO CAPS
ORAL_CAPSULE | ORAL | 1 refills | Status: DC
Start: 2023-07-29 — End: 2024-04-17

## 2023-08-03 DIAGNOSIS — M25562 Pain in left knee: Secondary | ICD-10-CM | POA: Diagnosis not present

## 2023-08-03 DIAGNOSIS — S39012D Strain of muscle, fascia and tendon of lower back, subsequent encounter: Secondary | ICD-10-CM | POA: Diagnosis not present

## 2023-08-03 DIAGNOSIS — R262 Difficulty in walking, not elsewhere classified: Secondary | ICD-10-CM | POA: Diagnosis not present

## 2023-08-10 DIAGNOSIS — R262 Difficulty in walking, not elsewhere classified: Secondary | ICD-10-CM | POA: Diagnosis not present

## 2023-08-10 DIAGNOSIS — M25562 Pain in left knee: Secondary | ICD-10-CM | POA: Diagnosis not present

## 2023-08-10 DIAGNOSIS — S39012D Strain of muscle, fascia and tendon of lower back, subsequent encounter: Secondary | ICD-10-CM | POA: Diagnosis not present

## 2023-08-17 DIAGNOSIS — S39012D Strain of muscle, fascia and tendon of lower back, subsequent encounter: Secondary | ICD-10-CM | POA: Diagnosis not present

## 2023-08-17 DIAGNOSIS — M25562 Pain in left knee: Secondary | ICD-10-CM | POA: Diagnosis not present

## 2023-08-17 DIAGNOSIS — R262 Difficulty in walking, not elsewhere classified: Secondary | ICD-10-CM | POA: Diagnosis not present

## 2023-08-20 ENCOUNTER — Ambulatory Visit
Admission: RE | Admit: 2023-08-20 | Discharge: 2023-08-20 | Disposition: A | Discharge status: Home / Self Care | Payer: Medicare Other | Source: Ambulatory Visit | Attending: Student in an Organized Health Care Education/Training Program | Admitting: Student in an Organized Health Care Education/Training Program

## 2023-08-20 DIAGNOSIS — R911 Solitary pulmonary nodule: Secondary | ICD-10-CM | POA: Diagnosis not present

## 2023-08-20 DIAGNOSIS — R262 Difficulty in walking, not elsewhere classified: Secondary | ICD-10-CM | POA: Diagnosis not present

## 2023-08-20 DIAGNOSIS — I700 Atherosclerosis of aorta: Secondary | ICD-10-CM | POA: Diagnosis not present

## 2023-08-20 DIAGNOSIS — S39012D Strain of muscle, fascia and tendon of lower back, subsequent encounter: Secondary | ICD-10-CM | POA: Diagnosis not present

## 2023-08-20 DIAGNOSIS — M25562 Pain in left knee: Secondary | ICD-10-CM | POA: Diagnosis not present

## 2023-08-20 DIAGNOSIS — R918 Other nonspecific abnormal finding of lung field: Secondary | ICD-10-CM | POA: Diagnosis not present

## 2023-08-20 DIAGNOSIS — I7121 Aneurysm of the ascending aorta, without rupture: Secondary | ICD-10-CM | POA: Diagnosis not present

## 2023-08-24 DIAGNOSIS — S39012D Strain of muscle, fascia and tendon of lower back, subsequent encounter: Secondary | ICD-10-CM | POA: Diagnosis not present

## 2023-08-24 DIAGNOSIS — R262 Difficulty in walking, not elsewhere classified: Secondary | ICD-10-CM | POA: Diagnosis not present

## 2023-08-24 DIAGNOSIS — M25562 Pain in left knee: Secondary | ICD-10-CM | POA: Diagnosis not present

## 2023-08-27 DIAGNOSIS — M25562 Pain in left knee: Secondary | ICD-10-CM | POA: Diagnosis not present

## 2023-08-27 DIAGNOSIS — S39012D Strain of muscle, fascia and tendon of lower back, subsequent encounter: Secondary | ICD-10-CM | POA: Diagnosis not present

## 2023-08-27 DIAGNOSIS — R262 Difficulty in walking, not elsewhere classified: Secondary | ICD-10-CM | POA: Diagnosis not present

## 2023-09-06 DIAGNOSIS — L299 Pruritus, unspecified: Secondary | ICD-10-CM | POA: Diagnosis not present

## 2023-09-06 DIAGNOSIS — Z8669 Personal history of other diseases of the nervous system and sense organs: Secondary | ICD-10-CM | POA: Diagnosis not present

## 2023-09-06 DIAGNOSIS — K219 Gastro-esophageal reflux disease without esophagitis: Secondary | ICD-10-CM | POA: Diagnosis not present

## 2023-09-06 DIAGNOSIS — H9202 Otalgia, left ear: Secondary | ICD-10-CM | POA: Diagnosis not present

## 2023-09-06 DIAGNOSIS — M26622 Arthralgia of left temporomandibular joint: Secondary | ICD-10-CM | POA: Diagnosis not present

## 2023-09-12 ENCOUNTER — Other Ambulatory Visit: Payer: Self-pay | Admitting: Student in an Organized Health Care Education/Training Program

## 2023-09-12 DIAGNOSIS — R911 Solitary pulmonary nodule: Secondary | ICD-10-CM

## 2023-09-17 DIAGNOSIS — S39012D Strain of muscle, fascia and tendon of lower back, subsequent encounter: Secondary | ICD-10-CM | POA: Diagnosis not present

## 2023-09-17 DIAGNOSIS — R262 Difficulty in walking, not elsewhere classified: Secondary | ICD-10-CM | POA: Diagnosis not present

## 2023-09-17 DIAGNOSIS — M25562 Pain in left knee: Secondary | ICD-10-CM | POA: Diagnosis not present

## 2023-09-24 DIAGNOSIS — M25562 Pain in left knee: Secondary | ICD-10-CM | POA: Diagnosis not present

## 2023-09-24 DIAGNOSIS — S39012D Strain of muscle, fascia and tendon of lower back, subsequent encounter: Secondary | ICD-10-CM | POA: Diagnosis not present

## 2023-09-24 DIAGNOSIS — R262 Difficulty in walking, not elsewhere classified: Secondary | ICD-10-CM | POA: Diagnosis not present

## 2023-09-24 NOTE — Progress Notes (Deleted)
Referring Physician:  Doreene Nest, NP [address] [place],  [place] [address]  Primary Physician:  Doreene Nest, NP  History of Present Illness: 09/24/2023 Kayla Gallegos has a history of GERD, IBS, hyperlipidemia.   She has history of MIS left L4-L5 laminectomy/foraminotomy in 2019 at [facility]. She improved after surgery, but looks like she started having increased back and right leg pain about a year later. She started HEP and this improved.    Last seen by me on 07/29/23.   She has known diffuse lumbar spondylosis. 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 She has severe facet hypertrophy L4-S1.    LBP likely from facets. Right leg pain was likely from L4-L5 and left left pain is likely from L5-S1.   We discussed lumbar injections and she declined. She was to continue with PT at [facility]. She had motrin and robaxin to use prn. She was started on neurontin.   She is here for follow up.    How is neurontin?   She has intermittent LBP with constant left lateral/posterior leg pain to her knee. Pain is worse with prolonged standing and walking (more than 10 minutes). She notes intermittent tingling. No weakness or numbness. Pain is worse with standing and walking.    She has been taking motrin with some relief. Not taking the robaxin.   Her husband recently had shoulder surgery and her PT is on hold for now. She thinks it may be helping.   Bowel/Bladder Dysfunction: none  Conservative measures:  Physical therapy: going to PT at [facility]  Multimodal medical therapy including regular antiinflammatories: naproxen , Ibuprofen over the counter Injections: no recent epidural steroid injections  Past Surgery:  MIS left L4-L5 laminectomy/foraminotomy in 2019 at [facility] [place] has no symptoms of cervical myelopathy.  The symptoms are causing a significant impact on the patient's life.   Review of Systems:  A 10 point review of  systems is negative, except for the pertinent positives and negatives detailed in the HPI.  Past Medical History: Past Medical History:  Diagnosis Date   Allergy    Anxiety    Cataract    bil cateracts removed   DIVERTICULOSIS, COLON 05/29/2008   Qualifier: Diagnosis of  By: Jonny Ruiz MD, Len Blalock    GERD (gastroesophageal reflux disease)    Hyperlipidemia    Kidney stones    Laceration of skin of thigh, right, sequela 08/14/2020   PARESTHESIA 04/01/2009   Qualifier: Diagnosis of  By: Jonny Ruiz MD, Len Blalock    Recurrent UTI (urinary tract infection)     Past Surgical History: Past Surgical History:  Procedure Laterality Date   ABDOMINAL HYSTERECTOMY  1976   APPENDECTOMY     BACK SURGERY  08/2018   [place]   COLONOSCOPY     LAPAROSCOPIC CHOLECYSTECTOMY  1990   TONSILLECTOMY     UPPER GASTROINTESTINAL ENDOSCOPY      Allergies: Allergies as of 09/29/2023 - Review Complete 07/29/2023  Allergen Reaction Noted   Prednisone Shortness Of Breath 05/31/2023   Macrobid [nitrofurantoin] Other (See Comments) 12/22/2022   Codeine Nausea And Vomiting and Nausea Only 11/17/2012    Medications: Outpatient Encounter Medications as of 09/29/2023  Medication Sig   Acetylcysteine (NAC 600 PO) Take by mouth.   ascorbic acid (VITAMIN C) 500 MG tablet Take by mouth daily.   B Complex Vitamins (VITAMIN B-COMPLEX) TABS Take by mouth.   FOLIC ACID PO Take  by mouth daily.   gabapentin (NEURONTIN) 100 MG capsule 100mg  q hs x 5 days, then can increase to 200mg  q hs as tolerated   Krill Oil (OMEGA-3) 500 MG CAPS Take by mouth.   Magnesium Carbonate, Antacid, (MAGNESIUM CARBONATE PO) Take by mouth.   Multiple Vitamin (MULTIVITAMIN ADULT PO) Take by mouth. Bone Renewal Takes Twice weekly   Policos-Garlic-Plant Sterols (PLANT STEROL CHOLESTEROL CONT PO) Take by mouth as needed (after eating fried foods).   Probiotic Product (PROBIOTIC DAILY PO) Take by mouth daily.   Vitamin D-Vitamin K (K2 PLUS D3) [phone]  MCG-UNIT TABS Take by mouth.   Zinc Sulfate 66 MG TABS Take by mouth.   No facility-administered encounter medications on file as of 09/29/2023.    Social History: Social History   Tobacco Use   Smoking status: Never   Smokeless tobacco: Never  Vaping Use   Vaping status: Never Used  Substance Use Topics   Alcohol use: No   Drug use: No    Family Medical History: Family History  Problem Relation Age of Onset   Colon cancer Sister 80   Colon cancer Brother 40   Stomach cancer Neg Hx    Esophageal cancer Neg Hx    Rectal cancer Neg Hx     Physical Examination: There were no vitals filed for this visit.  Awake, alert, oriented to person, place, and time.  Speech is clear and fluent. Fund of knowledge is appropriate.   Cranial Nerves: Pupils equal round and reactive to light.  Facial tone is symmetric.    Well healed lumbar incision. Mild tenderness at right SI joint.   No abnormal lesions on exposed skin.   Strength:  Side Iliopsoas Quads Hamstring PF DF EHL  R 5 5 5 5 5 5   L 5 5 5 5 5 5    Reflexes are 2+ and symmetric at the patella and achilles.   Clonus is not present.   Bilateral lower extremity sensation is intact to light touch.     Gait is normal.     Medical Decision Making  Imaging: None  Assessment and Plan: Kayla Gallegos is a pleasant 72 y.o. female who has a history of MIS left L4-L5 laminectomy/foraminotomy in 2019 at [facility].   She has intermittent LBP with constant left lateral/posterior leg pain to her knee. Pain is worse with prolonged standing and walking (more than 10 minutes). She notes intermittent tingling. No weakness or numbness. Pain is worse with standing and walking   She has known diffuse lumbar spondylosis. 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 She has severe facet hypertrophy L4-S1.    LBP likely from facets. Right leg pain was likely from L4-L5 and left left pain is likely from L5-S1.     Treatment options discussed with patient and following plan made:     - Continue with PT at [facility]. She will restart once husband recovers from shoulder surgery.  - Continue on motrin. Reviewed dosing and side effects. Take with food. She has robaxin to use prn. She knows it can make her sleepy.  - Will start neurontin 100mg  q hs to increase to 200mg  q hs as tolerated. Reviewed dosing and side effects. Call if any concerns.  - New prescription for robaxin to use prn spasms. Reviewed dosing and side effects.  - Discussed lumbar injections. She declines for now.  - Follow up with me in 6-8 weeks and prn.   I spent a total  of *** minutes in face-to-face and non-face-to-face activities related to this patient's care today including review of outside records, review of imaging, review of symptoms, physical exam, discussion of differential diagnosis, discussion of treatment options, and documentation.   Drake Leach PA-C Dept. of Neurosurgery

## 2023-09-29 ENCOUNTER — Ambulatory Visit: Payer: Medicare Other | Admitting: Orthopedic Surgery

## 2023-09-30 DIAGNOSIS — R262 Difficulty in walking, not elsewhere classified: Secondary | ICD-10-CM | POA: Diagnosis not present

## 2023-09-30 DIAGNOSIS — M25562 Pain in left knee: Secondary | ICD-10-CM | POA: Diagnosis not present

## 2023-09-30 DIAGNOSIS — S39012D Strain of muscle, fascia and tendon of lower back, subsequent encounter: Secondary | ICD-10-CM | POA: Diagnosis not present

## 2023-10-20 DIAGNOSIS — M26622 Arthralgia of left temporomandibular joint: Secondary | ICD-10-CM | POA: Diagnosis not present

## 2023-10-20 DIAGNOSIS — K219 Gastro-esophageal reflux disease without esophagitis: Secondary | ICD-10-CM | POA: Diagnosis not present

## 2023-10-20 DIAGNOSIS — L2989 Other pruritus: Secondary | ICD-10-CM | POA: Diagnosis not present

## 2023-10-20 DIAGNOSIS — H9202 Otalgia, left ear: Secondary | ICD-10-CM | POA: Diagnosis not present

## 2023-10-20 DIAGNOSIS — Z8669 Personal history of other diseases of the nervous system and sense organs: Secondary | ICD-10-CM | POA: Diagnosis not present

## 2023-10-20 DIAGNOSIS — J310 Chronic rhinitis: Secondary | ICD-10-CM | POA: Diagnosis not present

## 2023-10-26 DIAGNOSIS — M13831 Other specified arthritis, right wrist: Secondary | ICD-10-CM | POA: Diagnosis not present

## 2023-10-26 DIAGNOSIS — M65341 Trigger finger, right ring finger: Secondary | ICD-10-CM | POA: Diagnosis not present

## 2023-10-26 DIAGNOSIS — M65331 Trigger finger, right middle finger: Secondary | ICD-10-CM | POA: Diagnosis not present

## 2023-12-29 DIAGNOSIS — L814 Other melanin hyperpigmentation: Secondary | ICD-10-CM | POA: Diagnosis not present

## 2023-12-29 DIAGNOSIS — L309 Dermatitis, unspecified: Secondary | ICD-10-CM | POA: Diagnosis not present

## 2023-12-29 DIAGNOSIS — L821 Other seborrheic keratosis: Secondary | ICD-10-CM | POA: Diagnosis not present

## 2023-12-29 DIAGNOSIS — D229 Melanocytic nevi, unspecified: Secondary | ICD-10-CM | POA: Diagnosis not present

## 2023-12-29 DIAGNOSIS — L820 Inflamed seborrheic keratosis: Secondary | ICD-10-CM | POA: Diagnosis not present

## 2024-01-05 DIAGNOSIS — H52213 Irregular astigmatism, bilateral: Secondary | ICD-10-CM | POA: Diagnosis not present

## 2024-01-05 DIAGNOSIS — Z961 Presence of intraocular lens: Secondary | ICD-10-CM | POA: Diagnosis not present

## 2024-01-05 DIAGNOSIS — Z9842 Cataract extraction status, left eye: Secondary | ICD-10-CM | POA: Diagnosis not present

## 2024-01-05 DIAGNOSIS — Z9841 Cataract extraction status, right eye: Secondary | ICD-10-CM | POA: Diagnosis not present

## 2024-01-10 DIAGNOSIS — I10 Essential (primary) hypertension: Secondary | ICD-10-CM | POA: Diagnosis not present

## 2024-01-10 DIAGNOSIS — M5416 Radiculopathy, lumbar region: Secondary | ICD-10-CM | POA: Diagnosis not present

## 2024-01-10 DIAGNOSIS — Z1231 Encounter for screening mammogram for malignant neoplasm of breast: Secondary | ICD-10-CM | POA: Diagnosis not present

## 2024-01-10 DIAGNOSIS — E782 Mixed hyperlipidemia: Secondary | ICD-10-CM | POA: Diagnosis not present

## 2024-01-10 DIAGNOSIS — R739 Hyperglycemia, unspecified: Secondary | ICD-10-CM | POA: Diagnosis not present

## 2024-01-10 DIAGNOSIS — Z79899 Other long term (current) drug therapy: Secondary | ICD-10-CM | POA: Diagnosis not present

## 2024-01-19 ENCOUNTER — Ambulatory Visit: Payer: Self-pay | Admitting: Urology

## 2024-01-31 DIAGNOSIS — M5430 Sciatica, unspecified side: Secondary | ICD-10-CM | POA: Diagnosis not present

## 2024-03-07 DIAGNOSIS — R399 Unspecified symptoms and signs involving the genitourinary system: Secondary | ICD-10-CM | POA: Diagnosis not present

## 2024-03-14 ENCOUNTER — Ambulatory Visit: Payer: Medicare Other | Admitting: Urology

## 2024-03-15 ENCOUNTER — Encounter: Payer: Self-pay | Admitting: Urology

## 2024-04-10 NOTE — Progress Notes (Signed)
 Referring Physician:  Gabriel John, NP [address] [place],  [place] [address]  Primary Physician:  Gabriel John, NP  History of Present Illness: 04/17/2024 [name] has a history of GERD, IBS, hyperlipidemia.   She has history of MIS left L4-L5 laminectomy/foraminotomy in 2019 at [facility] She improved after surgery, but looks like she started having increased back and right leg pain about a year later. She started HEP and this improved.    Last seen by me on 07/29/23 for LBP and left leg pain.   She has known diffuse lumbar spondylosis. 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 She has severe facet hypertrophy L4-S1.   We discussed lumbar injections and she declined. She was to continue with PT at [facility]. She had motrin  and robaxin  to use prn. She was started in neurontin  at her last visit.   She is here for follow up.   She would like to restart PT at [facility] as it was helping- she's not been at all this year. She has been slowly moving homes since October. She continues with more constant LBP with bilateral posterior leg pain to her knees. This is worse in the morning and with standing. Pain is better with stretching and sitting. No numbness or weakness. She has tingling.   She's not sure if neurontin  helped. She ran out of it. She is taking prn motrin .   Bowel/Bladder Dysfunction: none  Conservative measures:  Physical therapy: previous PT at [facility]  Multimodal medical therapy including regular antiinflammatories: naproxen , Ibuprofen  over the counter Injections: no recent epidural steroid injections  Past Surgery:  MIS left L4-L5 laminectomy/foraminotomy in 2019 at [facility] [place] has no symptoms of cervical myelopathy.  The symptoms are causing a significant impact on the patient's life.   Review of Systems:  A 10 point review of systems is negative, except for the pertinent positives and negatives detailed in  the HPI.  Past Medical History: Past Medical History:  Diagnosis Date   Allergy    Anxiety    Cataract    bil cateracts removed   DIVERTICULOSIS, COLON 05/29/2008   Qualifier: Diagnosis of  By: Autry Legions MD, Alveda Aures    GERD (gastroesophageal reflux disease)    Hyperlipidemia    Kidney stones    Laceration of skin of thigh, right, sequela 08/14/2020   PARESTHESIA 04/01/2009   Qualifier: Diagnosis of  By: Autry Legions MD, Alveda Aures    Recurrent UTI (urinary tract infection)     Past Surgical History: Past Surgical History:  Procedure Laterality Date   ABDOMINAL HYSTERECTOMY  1976   APPENDECTOMY     BACK SURGERY  08/2018   [facility]   COLONOSCOPY     LAPAROSCOPIC CHOLECYSTECTOMY  1990   TONSILLECTOMY     UPPER GASTROINTESTINAL ENDOSCOPY      Allergies: Allergies as of 04/17/2024 - Review Complete 04/17/2024  Allergen Reaction Noted   Prednisone Shortness Of Breath 05/31/2023   Macrobid  [nitrofurantoin ] Other (See Comments) 12/22/2022   Codeine Nausea And Vomiting and Nausea Only 11/17/2012    Medications: Outpatient Encounter Medications as of 04/17/2024  Medication Sig   Acetylcysteine (NAC 600 PO) Take by mouth.   ascorbic acid (VITAMIN C) 500 MG tablet Take by mouth daily.   B Complex Vitamins (VITAMIN B-COMPLEX) TABS Take by mouth.   FOLIC ACID PO Take by mouth daily.   Krill Oil (OMEGA-3) 500 MG CAPS Take by mouth.  Magnesium  Carbonate, Antacid, (MAGNESIUM  CARBONATE PO) Take by mouth.   methocarbamol  (ROBAXIN ) 500 MG tablet Take 1 tablet (500 mg total) by mouth every 8 (eight) hours as needed for muscle spasms.   Multiple Vitamin (MULTIVITAMIN ADULT PO) Take by mouth. Bone Renewal Takes Twice weekly   Policos-Garlic-Plant Sterols (PLANT STEROL CHOLESTEROL CONT PO) Take by mouth as needed (after eating fried foods).   Probiotic Product (PROBIOTIC DAILY PO) Take by mouth daily.   Vitamin D-Vitamin K (K2 PLUS D3) [phone] MCG-UNIT TABS Take by mouth.   Zinc Sulfate 66 MG TABS Take  by mouth.   [DISCONTINUED] gabapentin  (NEURONTIN ) 100 MG capsule 100mg  q hs x 5 days, then can increase to 200mg  q hs as tolerated   No facility-administered encounter medications on file as of 04/17/2024.    Social History: Social History   Tobacco Use   Smoking status: Never   Smokeless tobacco: Never  Vaping Use   Vaping status: Never Used  Substance Use Topics   Alcohol use: No   Drug use: No    Family Medical History: Family History  Problem Relation Age of Onset   Colon cancer Sister 18   Colon cancer Brother 40   Stomach cancer Neg Hx    Esophageal cancer Neg Hx    Rectal cancer Neg Hx     Physical Examination: Vitals:   04/17/24 0826  BP: 136/74    Awake, alert, oriented to person, place, and time.  Speech is clear and fluent. Fund of knowledge is appropriate.   Cranial Nerves: Pupils equal round and reactive to light.  Facial tone is symmetric.    Well healed lumbar incision. No tenderness lumbar spine.   No abnormal lesions on exposed skin.   Strength:  Side Iliopsoas Quads Hamstring PF DF EHL  R 5 5 5 5 5 5   L 5 5 5 5 5 5    Reflexes are 2+ and symmetric at the patella and achilles.   Clonus is not present.   Bilateral lower extremity sensation is intact to light touch.     Gait is normal.     Medical Decision Making  Imaging: None  Assessment and Plan: [name] is a pleasant 73 y.o. female who has a history of MIS left L4-L5 laminectomy/foraminotomy in 2019 at [facility].   She continues with more constant LBP with bilateral posterior leg pain to her knees. No numbness or weakness. She has tingling.    She has known diffuse lumbar spondylosis. 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 She has severe facet hypertrophy L4-S1.    Treatment options discussed with patient and following plan made:    - Restart PT at [facility]. Orders done.   - Continue on motrin . Reviewed dosing and side effects. Take with food.   - New prescription for robaxin  to use prn spasms. Reviewed dosing and side effects. Knows it can make her sleepy.  - Discussed lumbar injections. She declines for now.  - Follow up with me in 6-8 weeks and prn.   I spent a total of 20 minutes in face-to-face and non-face-to-face activities related to this patient's care today including review of outside records, review of imaging, review of symptoms, physical exam, discussion of differential diagnosis, discussion of treatment options, and documentation.   Lucetta Russel PA-C Dept. of Neurosurgery

## 2024-04-17 ENCOUNTER — Encounter: Payer: Self-pay | Admitting: Orthopedic Surgery

## 2024-04-17 ENCOUNTER — Ambulatory Visit (INDEPENDENT_AMBULATORY_CARE_PROVIDER_SITE_OTHER): Admitting: Orthopedic Surgery

## 2024-04-17 VITALS — BP 136/74 | Ht 62.5 in | Wt 140.0 lb

## 2024-04-17 DIAGNOSIS — Z9889 Other specified postprocedural states: Secondary | ICD-10-CM

## 2024-04-17 DIAGNOSIS — M47816 Spondylosis without myelopathy or radiculopathy, lumbar region: Secondary | ICD-10-CM | POA: Diagnosis not present

## 2024-04-17 DIAGNOSIS — M51362 Other intervertebral disc degeneration, lumbar region with discogenic back pain and lower extremity pain: Secondary | ICD-10-CM | POA: Diagnosis not present

## 2024-04-17 DIAGNOSIS — M5416 Radiculopathy, lumbar region: Secondary | ICD-10-CM

## 2024-04-17 MED ORDER — METHOCARBAMOL 500 MG PO TABS: 500.0000 mg | ORAL_TABLET | Freq: Three times a day (TID) | ORAL | 0 refills | Status: AC | PRN

## 2024-04-17 NOTE — Patient Instructions (Signed)
 It was so nice to see you today. Thank you so much for coming in.    I sent physical therapy orders to [facility]. You can call them at [phone] if you don't hear from them to schedule your visit. I put that you requested to see Kayla Gallegos.   I also sent a prescription for methocarbamol  to help with muscle spasms. Use only as needed and be careful, this can make you sleepy.   I will see you back in 6-8 weeks. Please do not hesitate to call if you have any questions or concerns. You can also message me in MyChart.   Lucetta Russel PA-C [phone]     The physicians and staff at [facility] at [facility] are committed to providing excellent care. You may receive a survey asking for feedback about your experience at our office. We value you your feedback and appreciate you taking the time to to fill it out. The [facility] leadership team is also available to discuss your experience in person, feel free to contact us  [phone].

## 2024-04-18 DIAGNOSIS — H9191 Unspecified hearing loss, right ear: Secondary | ICD-10-CM | POA: Diagnosis not present

## 2024-04-18 DIAGNOSIS — H9312 Tinnitus, left ear: Secondary | ICD-10-CM | POA: Diagnosis not present

## 2024-04-18 DIAGNOSIS — H9313 Tinnitus, bilateral: Secondary | ICD-10-CM | POA: Diagnosis not present

## 2024-04-24 DIAGNOSIS — M25552 Pain in left hip: Secondary | ICD-10-CM | POA: Diagnosis not present

## 2024-04-24 DIAGNOSIS — M25551 Pain in right hip: Secondary | ICD-10-CM | POA: Diagnosis not present

## 2024-04-24 DIAGNOSIS — M5416 Radiculopathy, lumbar region: Secondary | ICD-10-CM | POA: Diagnosis not present

## 2024-04-27 DIAGNOSIS — M25551 Pain in right hip: Secondary | ICD-10-CM | POA: Diagnosis not present

## 2024-04-27 DIAGNOSIS — M25552 Pain in left hip: Secondary | ICD-10-CM | POA: Diagnosis not present

## 2024-04-27 DIAGNOSIS — M5416 Radiculopathy, lumbar region: Secondary | ICD-10-CM | POA: Diagnosis not present

## 2024-05-01 DIAGNOSIS — M5416 Radiculopathy, lumbar region: Secondary | ICD-10-CM | POA: Diagnosis not present

## 2024-05-01 DIAGNOSIS — M25552 Pain in left hip: Secondary | ICD-10-CM | POA: Diagnosis not present

## 2024-05-01 DIAGNOSIS — M25551 Pain in right hip: Secondary | ICD-10-CM | POA: Diagnosis not present

## 2024-05-03 DIAGNOSIS — M5416 Radiculopathy, lumbar region: Secondary | ICD-10-CM | POA: Diagnosis not present

## 2024-05-03 DIAGNOSIS — M25551 Pain in right hip: Secondary | ICD-10-CM | POA: Diagnosis not present

## 2024-05-03 DIAGNOSIS — M25552 Pain in left hip: Secondary | ICD-10-CM | POA: Diagnosis not present

## 2024-05-16 DIAGNOSIS — M5416 Radiculopathy, lumbar region: Secondary | ICD-10-CM | POA: Diagnosis not present

## 2024-05-16 DIAGNOSIS — M25551 Pain in right hip: Secondary | ICD-10-CM | POA: Diagnosis not present

## 2024-05-16 DIAGNOSIS — M25552 Pain in left hip: Secondary | ICD-10-CM | POA: Diagnosis not present

## 2024-05-18 DIAGNOSIS — M25551 Pain in right hip: Secondary | ICD-10-CM | POA: Diagnosis not present

## 2024-05-18 DIAGNOSIS — M25552 Pain in left hip: Secondary | ICD-10-CM | POA: Diagnosis not present

## 2024-05-18 DIAGNOSIS — M5416 Radiculopathy, lumbar region: Secondary | ICD-10-CM | POA: Diagnosis not present

## 2024-05-23 DIAGNOSIS — M5416 Radiculopathy, lumbar region: Secondary | ICD-10-CM | POA: Diagnosis not present

## 2024-05-23 DIAGNOSIS — M25552 Pain in left hip: Secondary | ICD-10-CM | POA: Diagnosis not present

## 2024-05-23 DIAGNOSIS — M25551 Pain in right hip: Secondary | ICD-10-CM | POA: Diagnosis not present

## 2024-05-25 ENCOUNTER — Telehealth: Payer: Self-pay

## 2024-05-25 DIAGNOSIS — M5416 Radiculopathy, lumbar region: Secondary | ICD-10-CM | POA: Diagnosis not present

## 2024-05-25 DIAGNOSIS — M25551 Pain in right hip: Secondary | ICD-10-CM | POA: Diagnosis not present

## 2024-05-25 DIAGNOSIS — M25552 Pain in left hip: Secondary | ICD-10-CM | POA: Diagnosis not present

## 2024-05-25 NOTE — Telephone Encounter (Signed)
 Copied from CRM [id number]. Topic: Appointments - Appointment Info/Confirmation >> May 25, 2024  1:47 PM Rosina BIRCH wrote: Patient/patient representative is calling for information regarding an appointment.   Patient returning call to [facility] for annual wellness appointment. I was on the CAL trying to get Erminio for the patient but the patient stated that Erminio can call her back. Patient stated she thought she canceled the appointment through MyChart because she was in the process of moving

## 2024-05-30 DIAGNOSIS — M5416 Radiculopathy, lumbar region: Secondary | ICD-10-CM | POA: Diagnosis not present

## 2024-05-30 DIAGNOSIS — M25552 Pain in left hip: Secondary | ICD-10-CM | POA: Diagnosis not present

## 2024-05-30 DIAGNOSIS — M25551 Pain in right hip: Secondary | ICD-10-CM | POA: Diagnosis not present

## 2024-06-02 DIAGNOSIS — M5416 Radiculopathy, lumbar region: Secondary | ICD-10-CM | POA: Diagnosis not present

## 2024-06-02 DIAGNOSIS — M25551 Pain in right hip: Secondary | ICD-10-CM | POA: Diagnosis not present

## 2024-06-02 DIAGNOSIS — M25552 Pain in left hip: Secondary | ICD-10-CM | POA: Diagnosis not present

## 2024-06-06 DIAGNOSIS — M25551 Pain in right hip: Secondary | ICD-10-CM | POA: Diagnosis not present

## 2024-06-06 DIAGNOSIS — M25552 Pain in left hip: Secondary | ICD-10-CM | POA: Diagnosis not present

## 2024-06-06 DIAGNOSIS — M5416 Radiculopathy, lumbar region: Secondary | ICD-10-CM | POA: Diagnosis not present

## 2024-06-08 DIAGNOSIS — M25551 Pain in right hip: Secondary | ICD-10-CM | POA: Diagnosis not present

## 2024-06-08 DIAGNOSIS — M25552 Pain in left hip: Secondary | ICD-10-CM | POA: Diagnosis not present

## 2024-06-08 DIAGNOSIS — M5416 Radiculopathy, lumbar region: Secondary | ICD-10-CM | POA: Diagnosis not present

## 2024-06-08 NOTE — Progress Notes (Deleted)
 Referring Physician:  Gretta Comer POUR, NP [address] [place],  [place] [address]  Primary Physician:  Gretta Comer POUR, NP  History of Present Illness: 06/08/2024 Kayla Gallegos has a history of GERD, IBS, hyperlipidemia.   She has history of MIS left L4-L5 laminectomy/foraminotomy in 2019 at [facility]. She improved after surgery, but looks like she started having increased back and right leg pain about a year later. She started HEP and this improved.    Last seen by me on 04/17/24 for LBP and bilateral leg pain. She has known diffuse lumbar spondylosis. 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 She has severe facet hypertrophy L4-S1.   She was sent back to PT at [facility]. She was started on robaxin  and was to continue on motrin . She declined lumbar injections.   She is here for follow up.   She would like to restart PT at [facility] as it was helping- she's not been at all this year. She has been slowly moving homes since October. She continues with more constant LBP with bilateral posterior leg pain to her knees. This is worse in the morning and with standing. Pain is better with stretching and sitting. No numbness or weakness. She has tingling.   She's not sure if neurontin  helped. She ran out of it. She is taking prn motrin .   Bowel/Bladder Dysfunction: none  Conservative measures:  Physical therapy: PT at [facility] initial eval 04/24/24 and has done 7 visits through 05/23/24 Multimodal medical therapy including regular antiinflammatories: naproxen , Ibuprofen  over the counter Injections: no recent epidural steroid injections  Past Surgery:  MIS left L4-L5 laminectomy/foraminotomy in 2019 at [facility] has no symptoms of cervical myelopathy.  The symptoms are causing a significant impact on the patient's life.   Review of Systems:  A 10 point review of systems is negative, except for the pertinent positives and negatives detailed in the  HPI.  Past Medical History: Past Medical History:  Diagnosis Date   Allergy    Anxiety    Cataract    bil cateracts removed   DIVERTICULOSIS, COLON 05/29/2008   Qualifier: Diagnosis of  By: Norleen MD, Lynwood ORN    GERD (gastroesophageal reflux disease)    Hyperlipidemia    Kidney stones    Laceration of skin of thigh, right, sequela 08/14/2020   PARESTHESIA 04/01/2009   Qualifier: Diagnosis of  By: Norleen MD, Lynwood ORN    Recurrent UTI (urinary tract infection)     Past Surgical History: Past Surgical History:  Procedure Laterality Date   ABDOMINAL HYSTERECTOMY  1976   APPENDECTOMY     BACK SURGERY  08/2018   [facility]   COLONOSCOPY     LAPAROSCOPIC CHOLECYSTECTOMY  1990   TONSILLECTOMY     UPPER GASTROINTESTINAL ENDOSCOPY      Allergies: Allergies as of 06/12/2024 - Review Complete 04/17/2024  Allergen Reaction Noted   Prednisone Shortness Of Breath 05/31/2023   Macrobid  [nitrofurantoin ] Other (See Comments) 12/22/2022   Codeine Nausea And Vomiting and Nausea Only 11/17/2012    Medications: Outpatient Encounter Medications as of 06/12/2024  Medication Sig   Acetylcysteine (NAC 600 PO) Take by mouth.   ascorbic acid (VITAMIN C) 500 MG tablet Take by mouth daily.   B Complex Vitamins (VITAMIN B-COMPLEX) TABS Take by mouth.   FOLIC ACID PO Take by mouth daily.   Krill Oil (OMEGA-3) 500 MG CAPS Take by mouth.   Magnesium   Carbonate, Antacid, (MAGNESIUM  CARBONATE PO) Take by mouth.   methocarbamol  (ROBAXIN ) 500 MG tablet Take 1 tablet (500 mg total) by mouth every 8 (eight) hours as needed for muscle spasms.   Multiple Vitamin (MULTIVITAMIN ADULT PO) Take by mouth. Bone Renewal Takes Twice weekly   Policos-Garlic-Plant Sterols (PLANT STEROL CHOLESTEROL CONT PO) Take by mouth as needed (after eating fried foods).   Probiotic Product (PROBIOTIC DAILY PO) Take by mouth daily.   Vitamin D-Vitamin K (K2 PLUS D3) [phone] MCG-UNIT TABS Take by mouth.   Zinc Sulfate 66 MG TABS Take by  mouth.   No facility-administered encounter medications on file as of 06/12/2024.    Social History: Social History   Tobacco Use   Smoking status: Never   Smokeless tobacco: Never  Vaping Use   Vaping status: Never Used  Substance Use Topics   Alcohol use: No   Drug use: No    Family Medical History: Family History  Problem Relation Age of Onset   Colon cancer Sister 46   Colon cancer Brother 40   Stomach cancer Neg Hx    Esophageal cancer Neg Hx    Rectal cancer Neg Hx     Physical Examination: There were no vitals filed for this visit.  Awake, alert, oriented to person, place, and time.  Speech is clear and fluent. Fund of knowledge is appropriate.   Cranial Nerves: Pupils equal round and reactive to light.  Facial tone is symmetric.    Well healed lumbar incision. No tenderness lumbar spine.   No abnormal lesions on exposed skin.   Strength:  Side Iliopsoas Quads Hamstring PF DF EHL  R 5 5 5 5 5 5   L 5 5 5 5 5 5    Reflexes are 2+ and symmetric at the patella and achilles.   Clonus is not present.   Bilateral lower extremity sensation is intact to light touch.     Gait is normal.     Medical Decision Making  Imaging: None  Assessment and Plan: Kayla Gallegos is a pleasant 73 y.o. female who has a history of MIS left L4-L5 laminectomy/foraminotomy in 2019 at [facility].   She continues with more constant LBP with bilateral posterior leg pain to her knees. No numbness or weakness. She has tingling.    She has known diffuse lumbar spondylosis. 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 She has severe facet hypertrophy L4-S1.    Treatment options discussed with patient and following plan made:    - Restart PT at [facility]. Orders done.   - Continue on motrin . Reviewed dosing and side effects. Take with food.  - New prescription for robaxin  to use prn spasms. Reviewed dosing and side effects. Knows it can make her sleepy.  -  Discussed lumbar injections. She declines for now.  - Follow up with me in 6-8 weeks and prn.   I spent a total of 20 minutes in face-to-face and non-face-to-face activities related to this patient's care today including review of outside records, review of imaging, review of symptoms, physical exam, discussion of differential diagnosis, discussion of treatment options, and documentation.   Glade Boys PA-C Dept. of Neurosurgery

## 2024-06-09 NOTE — Progress Notes (Signed)
 Referring Physician:  Gretta Comer POUR, NP [address] [place],  [place] [address]  Primary Physician:  Gretta Comer POUR, NP  History of Present Illness: 06/09/2024 [name] has a history of GERD, IBS, hyperlipidemia.   She has history of MIS left L4-L5 laminectomy/foraminotomy in 2019 at [facility]. She improved after surgery, but looks like she started having increased back and right leg pain about a year later. She started HEP and this improved.    Last seen by me on 04/17/24 for LBP and bilateral leg pain. She has known diffuse lumbar spondylosis. 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 She has severe facet hypertrophy L4-S1.   She was sent back to PT at [facility]. She was started on robaxin  and was to continue on motrin . She declined lumbar injections.   She is here for follow up.   She has minimal LBP with bilateral posterior thigh pain that is constant but varies, can be one or both. She feels like PT has helped her LBP but not her leg pain. She feels like her leg pain is worse. She has been moving since October and this has aggravated her back.   Pain is better with stretching and sitting. No numbness or weakness. She has tingling. Pain is worse in the morning in her legs.   She is taking prn motrin . She's taken an occasional robaxin .   Bowel/Bladder Dysfunction: none  Conservative measures:  Physical therapy: PT at [facility] initial eval 04/24/24 and has done 7 visits through 05/23/24 Multimodal medical therapy including regular antiinflammatories: naproxen , Ibuprofen  over the counter Injections: no recent epidural steroid injections  Past Surgery:  MIS left L4-L5 laminectomy/foraminotomy in 2019 at [facility] Kayla Gallegos has no symptoms of cervical myelopathy.  The symptoms are causing a significant impact on the patient's life.   Review of Systems:  A 10 point review of systems is negative, except for the pertinent positives and  negatives detailed in the HPI.  Past Medical History: Past Medical History:  Diagnosis Date   Allergy    Anxiety    Cataract    bil cateracts removed   DIVERTICULOSIS, COLON 05/29/2008   Qualifier: Diagnosis of  By: Norleen MD, Lynwood ORN    GERD (gastroesophageal reflux disease)    Hyperlipidemia    Kidney stones    Laceration of skin of thigh, right, sequela 08/14/2020   PARESTHESIA 04/01/2009   Qualifier: Diagnosis of  By: Norleen MD, Lynwood ORN    Recurrent UTI (urinary tract infection)     Past Surgical History: Past Surgical History:  Procedure Laterality Date   ABDOMINAL HYSTERECTOMY  1976   APPENDECTOMY     BACK SURGERY  08/2018   [facility] [place]   COLONOSCOPY     LAPAROSCOPIC CHOLECYSTECTOMY  1990   TONSILLECTOMY     UPPER GASTROINTESTINAL ENDOSCOPY      Allergies: Allergies as of 06/19/2024 - Review Complete 04/17/2024  Allergen Reaction Noted   Prednisone Shortness Of Breath 05/31/2023   Macrobid  [nitrofurantoin ] Other (See Comments) 12/22/2022   Codeine Nausea And Vomiting and Nausea Only 11/17/2012    Medications: Outpatient Encounter Medications as of 06/19/2024  Medication Sig   Acetylcysteine (NAC 600 PO) Take by mouth.   ascorbic acid (VITAMIN C) 500 MG tablet Take by mouth daily.   B Complex Vitamins (VITAMIN B-COMPLEX) TABS Take by mouth.   FOLIC ACID PO Take by mouth daily.   Krill Oil (OMEGA-3) 500 MG  CAPS Take by mouth.   Magnesium  Carbonate, Antacid, (MAGNESIUM  CARBONATE PO) Take by mouth.   methocarbamol  (ROBAXIN ) 500 MG tablet Take 1 tablet (500 mg total) by mouth every 8 (eight) hours as needed for muscle spasms.   Multiple Vitamin (MULTIVITAMIN ADULT PO) Take by mouth. Bone Renewal Takes Twice weekly   Policos-Garlic-Plant Sterols (PLANT STEROL CHOLESTEROL CONT PO) Take by mouth as needed (after eating fried foods).   Probiotic Product (PROBIOTIC DAILY PO) Take by mouth daily.   Vitamin D-Vitamin K (K2 PLUS D3) [phone] MCG-UNIT TABS Take by mouth.   Zinc  Sulfate 66 MG TABS Take by mouth.   No facility-administered encounter medications on file as of 06/19/2024.    Social History: Social History   Tobacco Use   Smoking status: Never   Smokeless tobacco: Never  Vaping Use   Vaping status: Never Used  Substance Use Topics   Alcohol use: No   Drug use: No    Family Medical History: Family History  Problem Relation Age of Onset   Colon cancer Sister 14   Colon cancer Brother 40   Stomach cancer Neg Hx    Esophageal cancer Neg Hx    Rectal cancer Neg Hx     Physical Examination: There were no vitals filed for this visit.  Awake, alert, oriented to person, place, and time.  Speech is clear and fluent. Fund of knowledge is appropriate.   Cranial Nerves: Pupils equal round and reactive to light.  Facial tone is symmetric.    Well healed lumbar incision. No tenderness lumbar spine. Mild tenderness over bilateral SI joints.   No abnormal lesions on exposed skin.   Strength:  Side Iliopsoas Quads Hamstring PF DF EHL  R 5 5 5 5 5 5   L 5 5 5 5 5 5    Reflexes are 2+ and symmetric at the patella and achilles.   Clonus is not present.   Bilateral lower extremity sensation is intact to light touch.     No pain with IR/ER of both hips.   Gait is normal.     Medical Decision Making  Imaging: None  Assessment and Plan: Kayla Gallegos is a pleasant 73 y.o. female who has a history of MIS left L4-L5 laminectomy/foraminotomy in 2019 at [facility].   She has minimal LBP this has improved with PT. Her leg pain is worse, she has bilateral posterior thigh pain that is constant but varies, can be one or both. Pain is worse in the morning.     She has known diffuse lumbar spondylosis. 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 She has severe facet hypertrophy L4-S1.    Treatment options discussed with patient and following plan made:    - Continue with PT at [facility].  - Continue on motrin . Reviewed dosing  and side effects. Take with food.  - Discussed lumbar injections. She declines for now.  - Discussed getting updated lumbar imaging. She wants to give PT more time.  - Follow up with me in 6-8 weeks and prn. May need to revisiting injections and/or updated imaging at that time.   I spent a total of 20 minutes in face-to-face and non-face-to-face activities related to this patient's care today including review of outside records, review of imaging, review of symptoms, physical exam, discussion of differential diagnosis, discussion of treatment options, and documentation.   Glade Boys PA-C Dept. of Neurosurgery

## 2024-06-12 ENCOUNTER — Ambulatory Visit: Admitting: Orthopedic Surgery

## 2024-06-13 DIAGNOSIS — M25551 Pain in right hip: Secondary | ICD-10-CM | POA: Diagnosis not present

## 2024-06-13 DIAGNOSIS — M5416 Radiculopathy, lumbar region: Secondary | ICD-10-CM | POA: Diagnosis not present

## 2024-06-13 DIAGNOSIS — M25552 Pain in left hip: Secondary | ICD-10-CM | POA: Diagnosis not present

## 2024-06-19 ENCOUNTER — Encounter: Payer: Self-pay | Admitting: Orthopedic Surgery

## 2024-06-19 ENCOUNTER — Ambulatory Visit (INDEPENDENT_AMBULATORY_CARE_PROVIDER_SITE_OTHER): Admitting: Orthopedic Surgery

## 2024-06-19 VITALS — BP 134/78 | Ht 62.5 in | Wt 140.0 lb

## 2024-06-19 DIAGNOSIS — M47816 Spondylosis without myelopathy or radiculopathy, lumbar region: Secondary | ICD-10-CM

## 2024-06-19 DIAGNOSIS — Z9889 Other specified postprocedural states: Secondary | ICD-10-CM

## 2024-06-19 DIAGNOSIS — M5416 Radiculopathy, lumbar region: Secondary | ICD-10-CM

## 2024-06-19 DIAGNOSIS — M47817 Spondylosis without myelopathy or radiculopathy, lumbosacral region: Secondary | ICD-10-CM | POA: Diagnosis not present

## 2024-06-19 DIAGNOSIS — M51361 Other intervertebral disc degeneration, lumbar region with lower extremity pain only: Secondary | ICD-10-CM

## 2024-06-19 DIAGNOSIS — M48061 Spinal stenosis, lumbar region without neurogenic claudication: Secondary | ICD-10-CM

## 2024-06-23 DIAGNOSIS — M25551 Pain in right hip: Secondary | ICD-10-CM | POA: Diagnosis not present

## 2024-06-23 DIAGNOSIS — M25552 Pain in left hip: Secondary | ICD-10-CM | POA: Diagnosis not present

## 2024-06-23 DIAGNOSIS — M5416 Radiculopathy, lumbar region: Secondary | ICD-10-CM | POA: Diagnosis not present

## 2024-06-28 DIAGNOSIS — M25551 Pain in right hip: Secondary | ICD-10-CM | POA: Diagnosis not present

## 2024-06-28 DIAGNOSIS — M25552 Pain in left hip: Secondary | ICD-10-CM | POA: Diagnosis not present

## 2024-06-28 DIAGNOSIS — M5416 Radiculopathy, lumbar region: Secondary | ICD-10-CM | POA: Diagnosis not present

## 2024-07-05 DIAGNOSIS — M25551 Pain in right hip: Secondary | ICD-10-CM | POA: Diagnosis not present

## 2024-07-05 DIAGNOSIS — M5416 Radiculopathy, lumbar region: Secondary | ICD-10-CM | POA: Diagnosis not present

## 2024-07-05 DIAGNOSIS — M25552 Pain in left hip: Secondary | ICD-10-CM | POA: Diagnosis not present

## 2024-07-12 DIAGNOSIS — M25551 Pain in right hip: Secondary | ICD-10-CM | POA: Diagnosis not present

## 2024-07-12 DIAGNOSIS — M5416 Radiculopathy, lumbar region: Secondary | ICD-10-CM | POA: Diagnosis not present

## 2024-07-12 DIAGNOSIS — M25552 Pain in left hip: Secondary | ICD-10-CM | POA: Diagnosis not present

## 2024-07-19 DIAGNOSIS — M25552 Pain in left hip: Secondary | ICD-10-CM | POA: Diagnosis not present

## 2024-07-19 DIAGNOSIS — M25551 Pain in right hip: Secondary | ICD-10-CM | POA: Diagnosis not present

## 2024-07-19 DIAGNOSIS — M5416 Radiculopathy, lumbar region: Secondary | ICD-10-CM | POA: Diagnosis not present

## 2024-08-02 DIAGNOSIS — M25551 Pain in right hip: Secondary | ICD-10-CM | POA: Diagnosis not present

## 2024-08-02 DIAGNOSIS — M25552 Pain in left hip: Secondary | ICD-10-CM | POA: Diagnosis not present

## 2024-08-02 DIAGNOSIS — M5416 Radiculopathy, lumbar region: Secondary | ICD-10-CM | POA: Diagnosis not present

## 2024-08-08 ENCOUNTER — Encounter

## 2024-08-10 ENCOUNTER — Encounter: Admitting: Primary Care

## 2024-08-14 DIAGNOSIS — M5416 Radiculopathy, lumbar region: Secondary | ICD-10-CM | POA: Diagnosis not present

## 2024-08-16 ENCOUNTER — Ambulatory Visit: Admitting: Orthopedic Surgery

## 2024-08-30 DIAGNOSIS — M545 Low back pain, unspecified: Secondary | ICD-10-CM | POA: Diagnosis not present

## 2024-09-06 DIAGNOSIS — M519 Unspecified thoracic, thoracolumbar and lumbosacral intervertebral disc disorder: Secondary | ICD-10-CM | POA: Diagnosis not present

## 2024-09-06 DIAGNOSIS — F4321 Adjustment disorder with depressed mood: Secondary | ICD-10-CM | POA: Diagnosis not present

## 2024-09-06 DIAGNOSIS — K862 Cyst of pancreas: Secondary | ICD-10-CM | POA: Diagnosis not present

## 2024-09-07 ENCOUNTER — Other Ambulatory Visit: Payer: Self-pay | Admitting: Internal Medicine

## 2024-09-07 DIAGNOSIS — K862 Cyst of pancreas: Secondary | ICD-10-CM

## 2024-09-10 ENCOUNTER — Other Ambulatory Visit: Payer: Self-pay | Admitting: Internal Medicine

## 2024-09-10 ENCOUNTER — Ambulatory Visit
Admission: RE | Admit: 2024-09-10 | Discharge: 2024-09-10 | Disposition: A | Discharge status: Home / Self Care | Source: Ambulatory Visit | Attending: Internal Medicine | Admitting: Internal Medicine

## 2024-09-10 DIAGNOSIS — K862 Cyst of pancreas: Secondary | ICD-10-CM | POA: Diagnosis not present

## 2024-09-10 DIAGNOSIS — R935 Abnormal findings on diagnostic imaging of other abdominal regions, including retroperitoneum: Secondary | ICD-10-CM | POA: Diagnosis not present

## 2024-09-23 DIAGNOSIS — R300 Dysuria: Secondary | ICD-10-CM | POA: Diagnosis not present
